# Patient Record
Sex: Female | Born: 1940 | Race: White | Hispanic: No | Marital: Married | Smoking: Former smoker
Health system: Southern US, Community
[De-identification: ages and names within clinical notes are randomized; demographics above are authoritative.]

## PROBLEM LIST (undated history)

## (undated) DIAGNOSIS — I10 Essential (primary) hypertension: Secondary | ICD-10-CM

## (undated) DIAGNOSIS — K649 Unspecified hemorrhoids: Secondary | ICD-10-CM

## (undated) DIAGNOSIS — M1990 Unspecified osteoarthritis, unspecified site: Secondary | ICD-10-CM

## (undated) DIAGNOSIS — M7120 Synovial cyst of popliteal space [Baker], unspecified knee: Secondary | ICD-10-CM

## (undated) DIAGNOSIS — K5900 Constipation, unspecified: Secondary | ICD-10-CM

## (undated) DIAGNOSIS — D499 Neoplasm of unspecified behavior of unspecified site: Secondary | ICD-10-CM

## (undated) DIAGNOSIS — I341 Nonrheumatic mitral (valve) prolapse: Secondary | ICD-10-CM

## (undated) DIAGNOSIS — L719 Rosacea, unspecified: Secondary | ICD-10-CM

## (undated) HISTORY — DX: Unspecified hemorrhoids: K64.9

## (undated) HISTORY — DX: Nonrheumatic mitral (valve) prolapse: I34.1

## (undated) HISTORY — PX: CARDIAC CATHETERIZATION: SHX172

## (undated) HISTORY — PX: COLONOSCOPY: SHX174

## (undated) HISTORY — PX: OTHER SURGICAL HISTORY: SHX169

## (undated) HISTORY — DX: Essential (primary) hypertension: I10

## (undated) HISTORY — DX: Constipation, unspecified: K59.00

---

## 1988-10-24 HISTORY — PX: AUGMENTATION MAMMAPLASTY: SUR837

## 2000-07-03 ENCOUNTER — Other Ambulatory Visit: Admission: RE | Admit: 2000-07-03 | Discharge: 2000-07-03 | Payer: Self-pay | Admitting: Obstetrics and Gynecology

## 2000-07-19 ENCOUNTER — Encounter: Payer: Self-pay | Admitting: Obstetrics and Gynecology

## 2000-07-19 ENCOUNTER — Encounter: Admission: RE | Admit: 2000-07-19 | Discharge: 2000-07-19 | Payer: Self-pay | Admitting: Obstetrics and Gynecology

## 2001-04-16 ENCOUNTER — Inpatient Hospital Stay (HOSPITAL_COMMUNITY): Admission: EM | Admit: 2001-04-16 | Discharge: 2001-04-17 | Payer: Self-pay | Admitting: Emergency Medicine

## 2001-04-16 ENCOUNTER — Encounter: Payer: Self-pay | Admitting: Emergency Medicine

## 2001-05-29 ENCOUNTER — Encounter: Admission: RE | Admit: 2001-05-29 | Discharge: 2001-05-29 | Payer: Self-pay | Admitting: Internal Medicine

## 2001-05-29 ENCOUNTER — Encounter: Payer: Self-pay | Admitting: Internal Medicine

## 2001-07-04 ENCOUNTER — Other Ambulatory Visit: Admission: RE | Admit: 2001-07-04 | Discharge: 2001-07-04 | Payer: Self-pay | Admitting: Obstetrics and Gynecology

## 2001-07-19 ENCOUNTER — Encounter: Admission: RE | Admit: 2001-07-19 | Discharge: 2001-07-19 | Payer: Self-pay | Admitting: Internal Medicine

## 2001-07-19 ENCOUNTER — Encounter: Payer: Self-pay | Admitting: Obstetrics and Gynecology

## 2002-07-29 ENCOUNTER — Encounter: Payer: Self-pay | Admitting: Obstetrics and Gynecology

## 2002-07-29 ENCOUNTER — Encounter: Admission: RE | Admit: 2002-07-29 | Discharge: 2002-07-29 | Payer: Self-pay | Admitting: Internal Medicine

## 2003-08-19 ENCOUNTER — Encounter: Admission: RE | Admit: 2003-08-19 | Discharge: 2003-08-19 | Payer: Self-pay | Admitting: Obstetrics and Gynecology

## 2005-01-14 ENCOUNTER — Encounter: Admission: RE | Admit: 2005-01-14 | Discharge: 2005-01-14 | Payer: Self-pay | Admitting: Obstetrics and Gynecology

## 2005-08-25 ENCOUNTER — Ambulatory Visit: Payer: Self-pay | Admitting: Internal Medicine

## 2005-09-08 ENCOUNTER — Ambulatory Visit: Payer: Self-pay | Admitting: Internal Medicine

## 2006-07-18 ENCOUNTER — Encounter: Admission: RE | Admit: 2006-07-18 | Discharge: 2006-07-18 | Payer: Self-pay | Admitting: Obstetrics and Gynecology

## 2008-01-07 ENCOUNTER — Encounter: Admission: RE | Admit: 2008-01-07 | Discharge: 2008-01-07 | Payer: Self-pay | Admitting: Obstetrics and Gynecology

## 2009-06-30 ENCOUNTER — Encounter: Admission: RE | Admit: 2009-06-30 | Discharge: 2009-06-30 | Payer: Self-pay | Admitting: Obstetrics and Gynecology

## 2011-03-11 NOTE — Cardiovascular Report (Signed)
[place]. [facility]  Patient:    Abigail [name], Abigail [name]                         MRN: [id number] Proc. Date: 04/16/01 Adm. Date:  [id number] Attending:  Ruta Hinds CC:         Janae Bridgeman. Eloise Harman., M.D.  Runell Gess, M.D.   Cardiac Catheterization  PROCEDURE:  Retrograde central aortic catheterization, selective coronary angiography via Judkins technique, left ventriculography, RAO and LAO projection.  Abdominal aortic angiogram, midstream PA projection, Perclose single stitch RFA closure device successful.  The patient was brought to the second floor CP lab in a post absorptive state after 5 mg Valium p.o. premedication.  She was given 2 mg of Versed for sedation in the laboratory.  Xylocaine 1% was used for local anesthesia.  The RFA after appropriate prepping and draping of the right groin was entered with an 18 thin wall needle with a single anterior puncture using modified Seldinger technique.  A 6 French vague sheath was inserted without difficulty and selective coronary angiography was done with a 6 French 4 cm taper Cordis preform coronary and pigtail catheters for LV angiogram in the RAO and LAO projection, 25 cc/14 cc per second, 20 cc/12 cc per second.  Pull back pressure CA was performed and showed no gradient across the aortic valve. Abdominal aortic angiogram in a midstream PA projection was done at 20 cc, 20 cc per second above the level of the renal arteries.  This demonstrated widely patent SMA and celiac.  A single normal left renal artery.  Minor 20-30% narrowing of the proximal right renal artery with good flow and good residual lumen.  The infrarenal abdominal aorta was smooth with no significant atherosclerosis and there was good runoff to the iliacs bilaterally with normal common iliac and intact hypogastrics bilaterally with good runoff.  Following review of the cineangiograms and removal of the catheters, the  hand injection of the right femoral artery showed good position in the RCFA of the sheath.  A single stitch Perclose 6 French device was used for arterial closure successfully.  The patient was transferred to the holding area for postoperative care in stable condition.  RESULTS: 1. Pressures.  LV 106/0.  LVEDP 14 mmHg.  CA 106/53 mmHg.  There    was no gradient across the aortic valve on catheter pullback.    Fluoroscopy did not reveal any intracardiac or valvular    calcification.  There was minor +1 calcification of the LAD. 2. Left ventricular angiogram in the RAO and LAO projection    showed a vigorous ventricle with greater than 60% EF.  MVP was    seen with no mitral regurgitation with sinus beats.  There    were no segmental wall motion abnormalities. 3. The main left coronary is normal. 4. The left anterior descending artery had smooth 40-50% mildly    eccentric narrowing in the mildly segmental and proximal third    after the first septal perforator and before the large first    diagonal.  There was no thrombus or spasm seen and there was    good residual lumen.  The remainder of the LAD was smooth,    widely patent, and coursed to the apex of the heart. 5. The first diagonal branch was moderately large originating    from the proximal third of the LAD, bifurcated and was normal. 6. There was a moderate  size optional diagonal branch that    bifurcated and was normal. 7. The circumflex artery was normal and comprised of a large OM1    branch, a small OM2 branch, and large distal branches with    PVAs in this codominant circumflex system.  The circumflex was    entirely normal. 8. The right coronary artery showed very mild 30% eccentric    narrowing, fairly focal in the midportion before two RV    branches.  The distal small PLA and PDA branches were normal    and there was no other narrowing or regularity of the RCA.  DISCUSSION:  This 70 year old white remarried mother of  three with three stepchildren and four grandchildren is a nonsmoker.  She has possible mild hypercholesterolemia (results unknown).  She recently had symptoms of UTI, and was treated by her gynecologist with Septra DS approximately three days ago. Over the last two days (Dr. Ambrose Mantle).  Over the last two days she has had several episodes of bilateral jaw discomfort with radiation down to the substernal region and substernal heaviness and pressure.  She had two episodes yesterday that lasted approximately five and another 10-15 minutes, and resolved spontaneously.  She had another episode today prompting her to come the emergency room.  She was pain free in the emergency room and had no significant EKG changes, except for nonspecific T-wave change in V1. There was a prior diagnosis of mitral valve prolapse with a midsystolic click, no significant murmur, and this has been asymptomatic.  There is a history of mild hypertension on atenolol.  The patient had an admitting troponin of 0.6, which was elevated with normal CK.  In view of the patients history and elevated troponin it was felt best to perform diagnostic angiography.  Fortunately, this shows no significant coronary artery, although she does have minor atherosclerosis with about 40% segmental smooth proximal LAD disease and 30% eccentric mid right disease. There was some minor calcification of the LAD in this area as well with normal LV function.  I would recommend treatment of this patient medically for mild CAD.  Empiric GI treatment may be in order at this time.  If there is recurrent chest pain, endoscopy may be considered.  In the future, follow-up exercise testing with probable Cardiolite may also be helpful.  Lipids are pending.  CATHETERIZATION DIAGNOSIS: 1. Chest pain.  Etiology not determined. 2. Minor coronary artery disease. a. 40% smooth segmental proximal LAD disease with mild    calcification. b. 30% eccentric mid  RCA disease. c. Normal LV function.  d. Mitral valve prolapse angiographically with no MR. 3. False positive elevated troponin. 4. Possible GERD. 5. Possible hyperlipidemia. 6. Mild systemic hypertension. 7. History of mitral valve prolapse. 8. Recent UTI on Septra therapy. DD:  04/16/01 TD:  04/17/01 Job: [id number]

## 2011-03-11 NOTE — H&P (Signed)
[place]  Patient:    Abigail [name], Abigail [name]                         MRN: [id number] Adm. Date:  [id number] Attending:  Ruta Hinds CC:         Richard A. Alanda Amass, M.D.   History and Physical  CHIEF COMPLAINT: "Ive been having chest pain for the last two days."  HISTORY OF PRESENT ILLNESS: This 70 year old married white female presents to the emergency room here at [facility]. [facility] after a two day history of substernal pressure and tightness associated with some diaphoresis. She also had some pain in the neck on both sides but no radiation in the arm. The first episode occurred yesterday morning early.  She took some aspirin and felt a little better, then had another episode at noon.  She contacted our on-call physician, who reviewed the history and since she was having no pain at that time he recommended that she go to the emergency room if she had any further pain.  This morning she had another milder episode of similar substernal pain and presented to the emergency room for further evaluation. She was seen by the EDP staff and this examiner was called for further disposition and then I contacted [facility] physicians to assess her for emergent catheterization.  PAST MEDICAL HISTORY:  1. Childhood illnesses - usual accidents, stress fracture years ago.  2. Saline breast implants in 1991 by Dr. Pleas Patricia.  3. Endometrial biopsy in July 1997 per Dr. Tracey Harries, gynecologist.  ALLERGIES/SENSITIVITIES/INTOLERANCES: None.  IMMUNIZATIONS: Tetanus booster current as of 1995.  She has not received pneumococcal vaccine yet.  CURRENT MEDICATIONS:  1. Prempro q.d.  2. Atenolol 25 mg q.d. for labile hypertension and control of mitral valve     prolapse syndrome.  3. Multivitamin q.d.  4. Calcium 1200 mg q.d.  5. Vitamin E q.d.  6. Vitamin C q.d.  7. Folic acid q.d.  FAMILY HISTORY: Father dead  at age 77 of tracheal cancer.  He was a smoker and was a Education officer, community by trade.  Mother died at age 13 of pulmonary embolus after hip revision surgery.  She walked around on a fractured hip for about two weeks and then had surgery and had the embolus and died.  She had a history of hypertension.  Siblings - one brother in good health.  Her second husband is in good health and is one year younger than the patient.  She has three children by her first husband, they are in good health.  A maternal aunt had heart attacks by history, the first when she was about mid-50; she died at age 21.  SOCIAL HISTORY: Marital status, married.  Religion, not specified.  Alcohol, frequent occasional beverage.  Caffeine, one cup a day.  Recreational drugs, none.  Sleep pattern/sleeping pills, none/normal/none.  Laxatives, occasionally used.  Occupation, housewife.  Club or church activity, some participation.  Tobacco, none at this time.  She did smoke less than a pack a day "socially" from the time she was age 60 up until the time she was about 70 years old.  She has had none in the last 15 years.  Exercise preference, she likes to work out regularly at a [employer].  She does gardening and walking.  Sexual function, denies problems.  Seat beats used 100% of the time.  REVIEW OF SYSTEMS: GENERAL:  Generally she has felt well.  No recent weight changes.  No fever, chills, or sweats.  No unusual weakness.  Her weight has not changed much.  SKIN: No significant changes in her skin or nails. HEAD/EYES: Denies any visual or hearing problems.  She does wear reading glasses.  No history of cataracts or glaucoma.  ENT: Denies any hearing or sinus problems.  She does have a little bit of runny nose intermittently.  She sees a Education officer, community on a regular basis.  RESPIRATORY: No recurring cough, wheezing, asthma, chest pain, or significant shortness of breath.  No known TB exposures.  CARDIOVASCULAR: History of mitral  valve prolapse and was documented on 2D echocardiogram in our office in 1998.  The valve was only mildly prolapsing.  She denies any major problems with her blood pressure and her pressures at home generally are much lower than any of the office readings, which have ranged as high as 168/112 in 1998, more recently 160/90, and down to 140/84.  GASTROINTESTINAL: Normal appetite and thirst.  She denies any significant reflux symptoms.  No nausea, vomiting, diarrhea, constipation of any consequence.  No history of jaundice.  GENITOURINARY: She is on treatment for acute cystitis as of about four days ago and she has taken Septra DS b.i.d. prescribed by her gynecologist.  She normally has no recurring infections and has had no kidney problems.  SEXUAL/REPRODUCTIVE: No menopausal problems.  She is on Prempro.  Last Pap smear per her gynecologist on an annual basis.  She has had an endometrial biopsy once by Dr. Ambrose Mantle with benign tissue found.  MUSCULOSKELETAL: She has a little bit of aching in her thighs, especially on the left sometimes after a workout but that rapidly resolves, and sometimes it occurs without a workout.  She has no joint complaints.  No swelling, no history of gout.  NEUROLOGIC: No changes in consciousness, no history of seizures, syncope, numbness, tingling, tremors, or memory loss.  ENDOCRINOLOGIC: No thyroid disorders, no heat or cold intolerance, no excessive hair growth.  HEMATOLOGIC: No bleeding or bruising. No history of transfusions.  PSYCHOLOGIC: Seems well adjusted and does not have any complaints of depression, nightmares, insomnia, or memory loss.  PHYSICAL EXAMINATION:  VITAL SIGNS: On presentation to the emergency room at 0735 temperature was 97.5 degrees, automatic blood pressure recorded at 152/70, pulse 58, respirations 18.  GENERAL: Currently she is lying in bed, comfortable, without any chest complaints, etc.  HEENT: PERRLA directly and consensually.   EOMI.  No signs of scleral icterus. Oral cavity shows good hygiene.  No lesions about the mouth.  Ear canals are  patent.  Skull shows no signs of trauma.  NECK: Supple.  No thyromegaly or adenopathy.  There are no carotid bruits.  HEART: Regular rate and rhythm without murmurs, gallops, rubs, or clicks.  CHEST: Lungs are completely clear.  ABDOMEN: Soft, nontender.  No masses.  PELVIC/RECTAL: Deferred.  EXTREMITIES: No edema, cyanosis, or clubbing.  She has excellent range of motion of her hips, knees, ankles.  Pulses are intact in the feet.  No lesions about the feet.  SKIN: Clear, without any unusual lesions noted grossly.  NEUROLOGIC: Grossly intact with no focal deficits evident on the bedside examination.  LABORATORY DATA: Her i-STAT in the emergency room initially shows a glucose of 101, BUN 10, sodium 137, chloride 105.  Hematocrit 40%, hemoglobin 14 g.  Her CK total was normal at 64.  Her troponin I was 0.60.  CBC shows a WBC of  5400, hemoglobin 14.1 g, hematocrit 40%; MCV 91.4; platelet count 287,000.  Chest x-ray done in the emergency room, but report is not yet available.  EKG was reportedly normal and was compared to office EKG of September 1997. The actual cardiogram is not in the chart at this time.  IMPRESSION:  1. Acute substernal chest pain strongly suspicious of coronary source, with     possible acute myocardial infarction evident from elevated troponin with     normal CK.  2. History of mitral valve prolapse.  3. Hypertension.  4. Borderline hyperlipidemia by history.  PLAN: The patient is admitted to the service of Dr. Dimas Alexandria. Consultants are [facility], Dr. Alanda Amass, who has seen the patient and agrees to indications for cardiac catheterization as soon as this can be accomplished today.  If nothing of significance is found she may be a candidate for early discharge. DD:  04/16/01 TD:  04/17/01 Job:  [id number]

## 2011-03-11 NOTE — Discharge Summary (Signed)
[place]  Patient:    SARYAH, LOPER                         MRN: [id number] Adm. Date:  [id number] Disc. Date: 04/17/01 Attending:  Ruta Hinds CC:         Richard A. Alanda Amass, M.D.   Discharge Summary  FINAL DIAGNOSES: 1. Substernal chest pain, probably noncoronary origin. 2. Urinary tract infection under treatment with Septra. 3. Possible esophageal spasms with intolerance to sulfa resulting in the    chest pain syndrome. 4. Mild hyperlipidemia with LDL cholesterol of 116 and total cholesterol of    219. 5. Urinary tract infection discovered and treatment initiated prior to    hospitalization.  HISTORY OF PRESENT ILLNESS:  This 70 year old married white female presented with a two-day history of substernal chest pain radiating to both sides of her neck and associated with some diaphoresis.  No nausea and vomiting.  Right after the second episode, she did present to the emergency room as directed by the [facility].  She was evaluated by the emergency room staff and subsequently admitted for further evaluation of her heart when her troponin I returned at 0.6 nannograms per ml with a normal CK of 64.  HOSPITAL COURSE:  The patient was admitted to the hospital.  She had consultation by Richard A. Alanda Amass, M.D. of [facility] in the absence of [facility] cardiologist, Aram Candela. Aleen Campi, M.D.  The consultant agreed that cardiac catheterization was indicated and this was accomplished on the afternoon of April 16, 2001. Findinsgs included the following:  40 to 50% smooth narrowing of the left main coronary artery.  There were some other relatively minor blockages maximum of 20 to 30%.  There was some mitral valve prolapse, but no mitral valve regurgitation.  Her ejection fraction was estimated at greater than 60%.  The consultant recommended continuation of  beta blocker therapy, lipid therapy, and further medical treatment.  On the morning of discharge, the patient was standing at the sink and had another episode of chest pain, although, she had no other untoward effects.  She had just taken her Septra DS antibiotic and it is considered to be a possible irritant and cause of her esophageal spasm. Her cardiac monitor showed no changes whatsoever during this episode. Nonetheless it was felt that the patient was stable enough for discharge. Thorough instructions were provided to her and her husband as well as written instructions.  LABORATORY DATA:  Labs as described above.  CBC was normal with hemoglobin of 14.1, hematocrit 40%.  Other labs were not performed.  A urinalysis ordered yesterday is not back in the chart yet.  CONSULTING PHYSICIANS:  Richard A. Alanda Amass, M.D., cardiologist for [facility].  PROCEDURE:  Cardiac catheterization by Dr. Alanda Amass.  RECOMMENDATIONS:  The patient was advised to change her beta blocker to Toprol XL 50 mg daily.  We will place her on Nexium 40 mg daily for possible acid reflux over the next 30 days.  She will start Lipitor 1/2 of a 10 mg tablet after the evening meal after fasting lipid panel is drawn on Thursday morning, two days hence.  She will continue on her prudent low fat diet and avoid carbonation and spicy food.  She will watch her groin for any signs of bleeding.  She will also return in four weeks for fasting lipids and  SGPT and see me in five weeks.  She will call for any further chest pain.  She will of course discontinue the sulfa at this time.  A follow-up urinalysis will also be done in two days.  CONDITION ON DISCHARGE:  Stable. DD:  04/17/01 TD:  04/17/01 Job: [id number]

## 2012-03-15 ENCOUNTER — Other Ambulatory Visit: Payer: Self-pay | Admitting: Obstetrics and Gynecology

## 2012-03-15 DIAGNOSIS — Z1231 Encounter for screening mammogram for malignant neoplasm of breast: Secondary | ICD-10-CM

## 2012-04-10 ENCOUNTER — Ambulatory Visit
Admission: RE | Admit: 2012-04-10 | Discharge: 2012-04-10 | Disposition: A | Discharge status: Home / Self Care | Payer: Medicare Other | Source: Ambulatory Visit | Attending: Obstetrics and Gynecology | Admitting: Obstetrics and Gynecology

## 2012-04-10 DIAGNOSIS — Z1231 Encounter for screening mammogram for malignant neoplasm of breast: Secondary | ICD-10-CM

## 2014-05-20 ENCOUNTER — Ambulatory Visit (INDEPENDENT_AMBULATORY_CARE_PROVIDER_SITE_OTHER): Payer: Medicare Other | Admitting: Family Medicine

## 2014-05-20 ENCOUNTER — Ambulatory Visit (INDEPENDENT_AMBULATORY_CARE_PROVIDER_SITE_OTHER)
Admission: RE | Admit: 2014-05-20 | Discharge: 2014-05-20 | Disposition: A | Discharge status: Home / Self Care | Payer: Medicare Other | Source: Ambulatory Visit | Attending: Family Medicine | Admitting: Family Medicine

## 2014-05-20 ENCOUNTER — Other Ambulatory Visit (INDEPENDENT_AMBULATORY_CARE_PROVIDER_SITE_OTHER): Payer: Medicare Other

## 2014-05-20 VITALS — BP 140/80 | HR 53 | Ht 66.5 in | Wt 127.0 lb

## 2014-05-20 DIAGNOSIS — M25562 Pain in left knee: Secondary | ICD-10-CM

## 2014-05-20 DIAGNOSIS — M25569 Pain in unspecified knee: Secondary | ICD-10-CM

## 2014-05-20 DIAGNOSIS — M7120 Synovial cyst of popliteal space [Baker], unspecified knee: Secondary | ICD-10-CM | POA: Insufficient documentation

## 2014-05-20 DIAGNOSIS — M7122 Synovial cyst of popliteal space [Baker], left knee: Secondary | ICD-10-CM

## 2014-05-20 NOTE — Assessment & Plan Note (Signed)
Patient significant amount fluid removed from the Baker cyst. Patient was given a compression sleeve and told to wear for the next 48 hours. We discussed icing protocol and which exercises would be beneficial. We discussed over-the-counter medications he can help slow the progression of any arthritis patient may be having. Patient will come back again in 2-3 weeks for further evaluation and treatment.

## 2014-05-20 NOTE — Progress Notes (Signed)
[name] [address], [place] [address] Phone: [phone] Subjective:     CC: Left knee pain  Abigail [name] is a 73 y.o. female coming in with complaint of left knee pain. Patient states that she has been diagnosed previously with a Baker cyst. Patient states that the pain is getting more significant the posterior aspect of his knee. Patient has had aspiration of this 2 times over the course last 2 years. Last time was greater than 6 months ago. Patient has not tried any O. modalities such as bracing, icing, or any over-the-counter anti-inflammatories. Patient states that due to the discomfort she has started avoiding such things as taking stairs a regular basis. Denies any radiation of the leg or any numbness or tingling. X-rays were ordered reviewed interpreted by me today. X-ray show that patient has some mild degenerative changes about the medial and lateral joint lines.    Past medical history, social, surgical and family history all reviewed in electronic medical record.   Review of Systems: No headache, visual changes, nausea, vomiting, diarrhea, constipation, dizziness, abdominal pain, skin rash, fevers, chills, night sweats, weight loss, swollen lymph nodes, body aches, joint swelling, muscle aches, chest pain, shortness of breath, mood changes.   Objective Blood pressure 140/80, pulse 53, height 5' 6.5" (1.689 m), weight 127 lb (57.607 kg), SpO2 99.00%.  General: No apparent distress alert and oriented x3 mood and affect normal, dressed appropriately.  HEENT: Pupils equal, extraocular movements intact  Respiratory: Patient's speak in full sentences and does not appear short of breath  Cardiovascular: No lower extremity edema, non tender, no erythema  Skin: Warm dry intact with no signs of infection or rash on extremities or on axial skeleton.  Abdomen: Soft nontender  Neuro: Cranial nerves II through XII are intact,  neurovascularly intact in all extremities with 2+ DTRs and 2+ pulses.  Lymph: No lymphadenopathy of posterior or anterior cervical chain or axillae bilaterally.  Gait normal with good balance and coordination.  MSK:  Non tender with full range of motion and good stability and symmetric strength and tone of shoulders, elbows, wrist, hip,  and ankles bilaterally.  Knee: Left Normal to inspection with no erythema or effusion or obvious bony abnormalities. Patient actually does have some swelling of the posterior popliteal fossa Mild tenderness to palpation of the popliteal fossa and the medial joint line.. Range of motion lacks the last 5 of flexion. Ligaments with solid consistent endpoints including ACL, PCL, LCL, MCL. Negative Mcmurray's, Apley's, and Thessalonian tests. Non painful patellar compression. Patellar glide without crepitus. Patellar and quadriceps tendons unremarkable. Hamstring and quadriceps strength is normal.   MSK US performed of: Left knee This study was ordered, performed, and interpreted by Abigail [name] D.O.  Knee: All structures visualized. Anteromedial, anterolateral, posteromedial, and posterolateral menisci unremarkable without tearing, fraying, effusion, or displacement. Mild narrowing of the medial joint line Patellar Tendon unremarkable on long and transverse views without effusion. No abnormality of prepatellar bursa. LCL and MCL unremarkable on long and transverse views. No abnormality of origin of medial or lateral head of the gastrocnemius. Large Baker cyst noted.  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  After verbal consent patient was prepped with alcohol swabs. Patient and her 23-gauge 2 inch needle was inserted in the posterior fossa were patient was prone directing from inferior medial ankle. Patient then had a injection of 3 cc of 0.5% Marcaine. Patient  then had aspiration of 30 cc of straw-colored fluid.  No bleeding noted. Patient had 1 cc of Kenalog 40 mg/dL injected into the cyst. Compression dressing was placed. Post aspiration instructions given.    Impression and Recommendations:     This case required medical decision making of moderate complexity.

## 2014-05-20 NOTE — Patient Instructions (Signed)
Good to meet you.  Ice 20 minutes 2 times daily. Usually after activity and before bed. Wear the compression next 48 hours then with activity.  Xrays downstairs today.  Take tylenol 650 mg three times a day is the best evidence based medicine we have for arthritis.  Glucosamine sulfate 750mg  twice a day is a supplement that has been shown to help moderate to severe arthritis. Vitamin D 2000 IU daily Fish oil 2 grams daily.  Tumeric 500mg  twice daily.  Capsaicin topically up to four times a day may also help with pain. Cortisone injections are an option if these interventions do not seem to make a difference or need more relief.  If cortisone injections do not help, there are different types of shots that may help but they take longer to take effect.  We can discuss this at follow up.  It's important that you continue to stay active.  El Consider physical therapy to strengthen muscles around the joint that hurts to take pressure off of the joint itself. Shoe inserts with good arch support may be helpful.  [employer] orthotics at [employer] could help.  Water aerobics and cycling AND ELLIPTICAL with low resistance are the best two types of exercise for arthritis. Come back and see me in 3 weeks.

## 2014-06-13 ENCOUNTER — Ambulatory Visit (INDEPENDENT_AMBULATORY_CARE_PROVIDER_SITE_OTHER): Payer: Medicare Other | Admitting: Family Medicine

## 2014-06-13 ENCOUNTER — Encounter: Payer: Self-pay | Admitting: Family Medicine

## 2014-06-13 VITALS — BP 108/72 | HR 56 | Ht 66.5 in | Wt 125.0 lb

## 2014-06-13 DIAGNOSIS — M1710 Unilateral primary osteoarthritis, unspecified knee: Secondary | ICD-10-CM

## 2014-06-13 DIAGNOSIS — M7122 Synovial cyst of popliteal space [Baker], left knee: Secondary | ICD-10-CM

## 2014-06-13 DIAGNOSIS — M7120 Synovial cyst of popliteal space [Baker], unspecified knee: Secondary | ICD-10-CM

## 2014-06-13 NOTE — Patient Instructions (Signed)
It is good to see you. Continue the exercises Try the new brace with a lot of walking or at the gym Physical therapy will be calling you Add turmeric 500mg  daily.  Conitnue the compression on the other knee.  For your back stand on a wall, heels, butt, shoulder and head touching for goal of 5 minutes daily.  Come back in 6 weeks.

## 2014-06-13 NOTE — Assessment & Plan Note (Signed)
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 Patient was given a brace was fitted by me today on the right knee. We discussed home exercises and we discussed protein supplementation to be beneficial. Patient can try these interventions as well as formal physical therapy and come back in 4-6 weeks for further evaluation and treatment.  Spent greater than 25 minutes with patient face-to-face and had greater than 50% of counseling including as described above in assessment and plan.

## 2014-06-13 NOTE — Progress Notes (Signed)
  [name] [place],  [address] Phone: [phone] Subjective:     CC: Left knee pain  DPO:EUMPNTIRWE ROSHELL BRIGHAM is a 73 y.o. female coming in with complaint of left knee pain. Patient states that she has been diagnosed previously with a Baker cyst. Patient had this drained 3 weeks ago and had a corticosteroid injection in the area. Patient states she is not having a significant pain. Patient is very happy with the result. Patient states that she has not started doing any exercises at this time. States that she'll he has some mild soreness on the medial aspect of her knees. Not giving her any internal derangement signs or any pain at night.    Past medical history, social, surgical and family history all reviewed in electronic medical record.   Review of Systems: No headache, visual changes, nausea, vomiting, diarrhea, constipation, dizziness, abdominal pain, skin rash, fevers, chills, night sweats, weight loss, swollen lymph nodes, body aches, joint swelling, muscle aches, chest pain, shortness of breath, mood changes.   Objective Blood pressure 108/72, pulse 56, height 5' 6.5" (1.689 m), weight 125 lb (56.7 kg), SpO2 99.00%.  General: No apparent distress alert and oriented x3 mood and affect normal, dressed appropriately.  HEENT: Pupils equal, extraocular movements intact  Respiratory: Patient's speak in full sentences and does not appear short of breath  Cardiovascular: No lower extremity edema, non tender, no erythema  Skin: Warm dry intact with no signs of infection or rash on extremities or on axial skeleton.  Abdomen: Soft nontender  Neuro: Cranial nerves II through XII are intact, neurovascularly intact in all extremities with 2+ DTRs and 2+ pulses.  Lymph: No lymphadenopathy of posterior or anterior cervical chain or axillae bilaterally.  Gait normal with good balance and coordination.  MSK:  Non tender with full range of  motion and good stability and symmetric strength and tone of shoulders, elbows, wrist, hip,  and ankles bilaterally.  Knee: Left Normal to inspection with no erythema or effusion or obvious bony abnormalities. Patient actually does have some swelling of the posterior popliteal fossa Mild tenderness to palpation of the popliteal fossa and the medial joint line.. Range of motion lacks the last 5 of flexion. Ligaments with solid consistent endpoints including ACL, PCL, LCL, MCL. Negative Mcmurray's, Apley's, and Thessalonian tests. Non painful patellar compression. Patellar glide without crepitus. Patellar and quadriceps tendons unremarkable. Hamstring and quadriceps strength is normal.   MSK US performed of: Left knee This study was ordered, performed, and interpreted by Charlann Boxer D.O.  Knee: All structures visualized. Anteromedial, anterolateral, posteromedial, and posterolateral menisci unremarkable without tearing, fraying, effusion, or displacement. Mild narrowing of the medial joint line Patellar Tendon unremarkable on long and transverse views without effusion. No abnormality of prepatellar bursa. LCL and MCL unremarkable on long and transverse views. No abnormality of origin of medial or lateral head of the gastrocnemius. Baker's cyst still noted but significantly smaller than previous exam.  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     Impression and Recommendations:     This case required medical decision making of moderate complexity.

## 2014-06-13 NOTE — Assessment & Plan Note (Signed)
Patient Abigail [name] is coming back slowly. We'll continue to monitor closely. Do not feel that we need to do anything else. We'll continue to monitor and continued compression sleeves. Patient does have mild underlying osteoarthritis that could be contributing.

## 2014-07-18 ENCOUNTER — Ambulatory Visit (INDEPENDENT_AMBULATORY_CARE_PROVIDER_SITE_OTHER): Payer: Medicare Other | Admitting: *Deleted

## 2014-07-18 DIAGNOSIS — Z23 Encounter for immunization: Secondary | ICD-10-CM

## 2014-07-25 ENCOUNTER — Ambulatory Visit: Payer: Medicare Other | Admitting: Family Medicine

## 2014-08-01 ENCOUNTER — Ambulatory Visit: Payer: Medicare Other | Admitting: Family Medicine

## 2014-08-06 ENCOUNTER — Ambulatory Visit: Payer: Medicare Other | Admitting: Family Medicine

## 2014-08-15 ENCOUNTER — Ambulatory Visit (INDEPENDENT_AMBULATORY_CARE_PROVIDER_SITE_OTHER): Payer: Medicare Other | Admitting: Family Medicine

## 2014-08-15 ENCOUNTER — Encounter: Payer: Self-pay | Admitting: Family Medicine

## 2014-08-15 VITALS — BP 124/82 | HR 55 | Ht 66.0 in | Wt 129.0 lb

## 2014-08-15 DIAGNOSIS — M1710 Unilateral primary osteoarthritis, unspecified knee: Secondary | ICD-10-CM

## 2014-08-15 DIAGNOSIS — M7122 Synovial cyst of popliteal space [Baker], left knee: Secondary | ICD-10-CM

## 2014-08-15 NOTE — Assessment & Plan Note (Signed)
Patient did have aspiration done today as well. 25 cc of fluid removed.

## 2014-08-15 NOTE — Assessment & Plan Note (Addendum)
Patient does have bilateral mild to moderate osteoarthritic changes. Patient was given corticosteroid injections bilaterally today. We discussed icing protocol as well as home exercises. Discussed over-the-counter bracing that might be beneficial. Encourage patient to continue to stay active. Patient will do and icing protocol. Come back in 3 weeks.  PT reorded. Patient does not make any significant improvement at that the corticosteroid injections as well as formal physical therapy patient would be a candidate for viscous supplementation.  Spent greater than 25 minutes with patient face-to-face and had greater than 50% of counseling including as described above in assessment and plan.

## 2014-08-15 NOTE — Patient Instructions (Signed)
Good to see you as always.  Continue the compression [email or web address]  Size medium knee sleeve.  Ice is your friend Arloa Koh try physical therapy again.  Try pennsaid twice daily to hip or knees as needed.  See you again  In 3 weeks and if still in paincan consider orthovisc injections.

## 2014-08-15 NOTE — Progress Notes (Signed)
[name] [address] [place], [place] [address] Phone: [phone] Subjective:     CC: bilateral knee pain   LGX:QJJHERDEYC Abigail [name] is a 73 y.o. female coming in with complaint of left knee pain. 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 Patient had this drained back in July. Patient was also found to have mild osteoarthritic changes. Patient was given a brace and was sent to formal physical therapy. Patient forcefully never got him to formal physical therapy. Patient has been doing some in the home exercises as well as when compression but not wearing the brace a regular basis. Patient states that her knee pain is about the same. Patient states that she's doing the over-the-counter medicines intermittently and doing the exercises intermittently. Patient states that she walks long distances it is more painful. Patient denies any nighttime awakening but finds it difficult to become comfortable at night. Denies any new symptoms has worsening of previous symptoms. Patient also notices that unfortunately her Baker cyst on the left side has enlarged.  Past medical history, social, surgical and family history all reviewed in electronic medical record.   Review of Systems: No headache, visual changes, nausea, vomiting, diarrhea, constipation, dizziness, abdominal pain, skin rash, fevers, chills, night sweats, weight loss, swollen lymph nodes, body aches, joint swelling, muscle aches, chest pain, shortness of breath, mood changes.   Objective Blood pressure 124/82, pulse 55, height 5\' 6"  (1.676 m), weight 129 lb (58.514 kg), SpO2 99.00%.  General: No apparent distress alert and oriented x3 mood and affect normal, dressed appropriately.  HEENT: Pupils equal, extraocular movements intact  Respiratory: Patient's speak in full sentences and does not appear  short of breath  Cardiovascular: No lower extremity edema, non tender, no erythema  Skin: Warm dry intact with no signs of infection or rash on extremities or on axial skeleton.  Abdomen: Soft nontender  Neuro: Cranial nerves II through XII are intact, neurovascularly intact in all extremities with 2+ DTRs and 2+ pulses.  Lymph: No lymphadenopathy of posterior or anterior cervical chain or axillae bilaterally.  Gait normal with good balance and coordination.  MSK:  Non tender with full range of motion and good stability and symmetric strength and tone of shoulders, elbows, wrist, hip,  and ankles bilaterally.  Knee: Left Normal to inspection with no erythema or effusion or obvious bony abnormalities. Patient actually does have some swelling of the posterior popliteal fossa Mild tenderness to palpation of the popliteal fossa and the medial joint line.. Range of motion lacks the last 5 of flexion. Ligaments with solid consistent endpoints including ACL, PCL, LCL, MCL. Negative Mcmurray's, Apley's, and Thessalonian tests. Non painful patellar compression. Patellar glide without crepitus. Patellar and quadriceps tendons unremarkable. Hamstring and quadriceps strength is normal.  Right knee is tender to palpation over the medial joint line.  After informed written and verbal consent, patient was seated on exam table. Right knee was prepped with alcohol swab and utilizing anterolateral approach, patient's right knee space was injected with 4:1  marcaine 0.5%: Kenalog 40mg /dL. Patient tolerated the procedure well without immediate complications.  After informed written and verbal consent, patient was seated on exam table. Left knee was prepped with alcohol swab and utilizing anterolateral approach, patient's left knee space was injected with 4:1  marcaine 0.5%: Kenalog 40mg /dL. Patient tolerated the procedure well without immediate complications. Patient then  went into a prone position and then with  a 21-gauge 2 inch needle was injected with 2 cc of 0.5% Marcaine after sterilizing the shin over the Baker's cyst. Patient had 25 cc of fluid removed. Patient tolerated the procedure well. No signs of infection. Postinjection instructions given.      Impression and Recommendations:     This case required medical decision making of moderate complexity.

## 2014-09-02 ENCOUNTER — Encounter: Payer: Self-pay | Admitting: Physical Therapy

## 2014-09-02 ENCOUNTER — Ambulatory Visit: Payer: Medicare Other | Attending: Family Medicine | Admitting: Physical Therapy

## 2014-09-02 DIAGNOSIS — M25561 Pain in right knee: Secondary | ICD-10-CM | POA: Diagnosis not present

## 2014-09-02 DIAGNOSIS — M25562 Pain in left knee: Secondary | ICD-10-CM | POA: Diagnosis not present

## 2014-09-02 DIAGNOSIS — M170 Bilateral primary osteoarthritis of knee: Secondary | ICD-10-CM | POA: Diagnosis not present

## 2014-09-02 NOTE — Therapy (Signed)
Physical Therapy Evaluation  Patient Details  Name: Abigail [name] MRN: [id number] Date of Birth: [birth year]  Encounter Date: 09/02/2014      PT End of Session - 09/02/14 1103    Visit Number 1   Number of Visits 16   Date for PT Re-Evaluation 11/01/14   PT Start Time 1017   PT Stop Time 1059   PT Time Calculation (min) 42 min   Activity Tolerance Patient tolerated treatment well   Behavior During Therapy [facility] for tasks assessed/performed      History reviewed. No pertinent past medical history.  History reviewed. No pertinent past surgical history.  There were no vitals taken for this visit.  Visit Diagnosis:  Arthralgia of both knees - Plan: PT plan of care cert/re-cert  Arthritis of both knees - Plan: PT plan of care cert/re-cert      Subjective Assessment - 09/02/14 1019    Symptoms Pt presents to OPPT for bil knee pain and OA, L>R.  Pt. with history of baker's cyst L knee, and stopped exercise at that time about 2 years ago.  Pt. reports knee pain began about 2 years ago however recent increased difficulty with negotiating stairs.  Pt. reports decreased strength and pain.  Pt with recent injection in bil knees and reports good relief with those.   Limitations Standing;Walking;House hold activities   How long can you stand comfortably? varies, typically back pain increases before knee pain with standing   How long can you walk comfortably? 1 mile   Patient Stated Goals go up/down stairs, perform yardwork, walk dog   Currently in Pain? Yes   Pain Score 2    Pain Location Knee   Pain Orientation Right;Left;Anterior   Pain Descriptors / Indicators Aching;Radiating  occasional radiating to ankles   Pain Type Chronic pain   Pain Onset More than a month ago   Pain Frequency Intermittent   Aggravating Factors  prolonged standing/walking, overuse   Pain Relieving Factors injections, sitting/rest   Effect of Pain on Daily Activities go up/down stairs, performing yardwork,  walking dog   Multiple Pain Sites No          [facility] PT Assessment - 09/02/14 0700    Assessment   Medical Diagnosis bil knee OA   Next MD Visit 1 week   Prior Therapy no formal PT, has exercises from MD   Precautions   Precautions None   Restrictions   Weight Bearing Restrictions No   Balance Screen   Has the patient fallen in the past 6 months No   Has the patient had a decrease in activity level because of a fear of falling?  No   Is the patient reluctant to leave their home because of a fear of falling?  No   Home Environment   Living Enviornment Private residence   Living Arrangements Spouse/significant other   Available Help at Discharge Available 24 hours/day   Type of [place] to enter   Entrance Stairs-Number of Steps 1   Entrance Stairs-Rails None   Home Layout Two level   Alternate Level Stairs-Number of Steps 13   Alternate Level Stairs-Rails Right   Prior Function   Level of Independence Independent with homemaking with ambulation;Independent with gait;Independent with transfers   Vocation Retired   Leisure active with yardwork and gardening   Observation/Other Assessments   Focus on Therapeutic Outcomes (FOTO)  42  58% limited, 45% predicted   AROM  Overall AROM  Within functional limits for tasks performed   Overall AROM Comments bil knee ROM WNL   Strength   Right Hip Flexion 3/5   Right Hip Extension 3/5   Right Hip External Rotation  4   Right Hip Internal Rotation  3   Right Hip ABduction 3+/5   Right Hip ADduction 3/5   Left Hip Flexion 3/5   Left Hip Extension 3/5   Left Hip External Rotation  4   Left Hip Internal Rotation  3   Left Hip ABduction 3+/5   Left Hip ADduction 3/5   Right Knee Flexion 4/5   Right Knee Extension 4/5   Left Knee Flexion 4/5   Left Knee Extension 4/5   Right Ankle Dorsiflexion 4/5   Left Ankle Dorsiflexion 4/5   Special Tests    Special Tests --   Knee Special tests  --    Ambulation/Gait   Stairs Yes   Stairs Assistance 7: Independent   Stair Management Technique No rails;Alternating pattern  bil internal rotation suggestive of weak hip stabilizers   Number of Stairs 10   Height of Stairs 4  and 6 in.            PT Education - 09/02/14 1103    Education provided Yes   Education Details HEP   Person(s) Educated Patient   Methods Explanation;Demonstration;Handout   Comprehension Verbalized understanding          PT Short Term Goals - 09/02/14 1106    PT SHORT TERM GOAL #1   Title independent with HEP   Time 4   Period Weeks   Status New   PT SHORT TERM GOAL #2   Title report return to community fitness at least 2 days/week for endurance and strengthening   Time 4   Period Weeks   Status New   PT SHORT TERM GOAL #3   Title report ability to walk 1.5 miles without increase in pain   Time 4   Period Weeks   Status New          PT Long Term Goals - 09/02/14 1107    PT LONG TERM GOAL #1   Title verbalize understanding of RICE to reduce the risk of reinjury   Time 8   Period Weeks   Status New   PT LONG TERM GOAL #2   Title independent with advanced HEP   Time 8   Period Weeks   Status New   PT LONG TERM GOAL #3   Title report ability to walk 2 miles without increase in pain   Time 8   Period Weeks   Status New   PT LONG TERM GOAL #4   Title negotiate stairs/ambulate > 100' carrying at least 10# to simulate home and yard activities without increase in pain or LOB.   Time 8   Period Weeks   Status New          Plan - 09/02/14 1103    Clinical Impression Statement Pt presents to OPPT for deficits listed above.  Will continue to benefit from PT to maximize functional mobility and decrease pain.   Pt will benefit from skilled therapeutic intervention in order to improve on the following deficits Difficulty walking;Pain;Decreased strength;Decreased activity tolerance;Decreased endurance   Rehab Potential Good   PT  Frequency 2x / week   PT Duration 8 weeks   PT Treatment/Interventions ADLs/Self Care Home Management;Moist Heat;Therapeutic activities;Patient/family education;Therapeutic exercise;Gait training;Balance training;Manual techniques;Neuromuscular re-education;Stair training;Electrical  Stimulation;Functional mobility training;Compression bandaging;Ultrasound   PT Next Visit Plan review HEP; progress strengthening as tolerated   PT Home Exercise Plan perform as instructed   Consulted and Agree with Plan of Care Patient          G-Codes - 09-25-2014 1112    Functional Assessment Tool Used FOTO 42 (58% limited)   Functional Limitation Mobility: Walking and moving around   Mobility: Walking and Moving Around Current Status (B1517) At least 40 percent but less than 60 percent impaired, limited or restricted   Mobility: Walking and Moving Around Goal Status [phone]) At least 40 percent but less than 60 percent impaired, limited or restricted      Problem List Patient Active Problem List   Diagnosis Date Noted  . Primary localized osteoarthrosis, lower leg 06/13/2014  . Baker's cyst of knee 05/20/2014                                              Faustino Congress K 2014-09-25, 11:16 AM

## 2014-09-02 NOTE — Patient Instructions (Signed)
Knee High   Holding stable object, raise knee to hip level, then lower knee. Repeat with other knee. Repeat _10-15___ times. Do _2-3___ sessions per day.  http://gt2.exer.us/767   ABDUCTION: Standing (Active)   Stand, feet flat. Lift right leg out to side.  Complete _1__ sets of _10-15__ repetitions. Perform _2-3__ sessions per day.  http://gtsc.exer.us/110   Copyright  VHI. All rights reserved.   FLEXION: Standing - Stable (Active)   Stand, both feet flat. Bend right knee, bringing heel toward buttocks.  Complete _1__ sets of _10-15__ repetitions. Perform _2-3__ sessions per day.  http://gtsc.exer.us/240   Copyright  VHI. All rights reserved.   Mini-Squats (Standing)   Stand with support. Bend knees slightly. Hold for _2-3__ seconds. Return to straight standing. Relax for _2-3__ seconds. Repeat _10-15__ times. Do _2-3__ times a day.  Copyright  VHI. All rights reserved.     Heel Raises   Stand with support. With knees straight, raise heels off ground. Hold _2-3__ seconds. Relax for _2-3__ seconds. Repeat _10-15__ times. Do _2-3__ times a day.  Copyright  VHI. All rights reserved.   EXTENSION: Standing (Active)   Stand, both feet flat. Draw right leg behind body as far as possible. Complete _1__ sets of _10-15__ repetitions. Perform _2-3__ sessions per day.  http://gtsc.exer.us/76   Copyright  VHI. All rights reserved.

## 2014-09-09 ENCOUNTER — Ambulatory Visit: Payer: Medicare Other | Admitting: Family Medicine

## 2014-09-25 ENCOUNTER — Encounter: Payer: Medicare Other | Admitting: Physical Therapy

## 2014-09-30 ENCOUNTER — Telehealth: Payer: Self-pay | Admitting: *Deleted

## 2014-09-30 ENCOUNTER — Ambulatory Visit: Payer: Medicare Other | Attending: Family Medicine | Admitting: Physical Therapy

## 2014-09-30 DIAGNOSIS — M25562 Pain in left knee: Secondary | ICD-10-CM | POA: Diagnosis not present

## 2014-09-30 DIAGNOSIS — M25561 Pain in right knee: Secondary | ICD-10-CM | POA: Insufficient documentation

## 2014-09-30 DIAGNOSIS — M170 Bilateral primary osteoarthritis of knee: Secondary | ICD-10-CM | POA: Diagnosis not present

## 2014-09-30 NOTE — Patient Instructions (Signed)
Piriformis Stretch, Supine   Lie supine, one ankle crossed onto opposite knee. Holding bottom leg behind knee, gently pull legs toward chest until stretch is felt in buttock of top leg. Hold _30__ seconds. For deeper stretch gently push top knee away from body.  Repeat _3__ times per session. Do __1-3_ sessions per day.  Copyright  VHI. All rights reserved.    Iliotibial Band Stretch   Stand with right hip __6-8__ inches from wall. Cross other leg in front and use it and arm for support. Lean toward wall until a stretch is felt on outside of hip near wall. Keep that leg straight. Hold __30__ seconds. Repeat with other leg. Repeat _3___ times. Do __1-3_ sessions per day.  http://gt2.exer.us/355   Copyright  VHI. All rights reserved.   Laureen Abrahams, PT, DPT 09/30/2014 11:37 AM  [facility] [address] [place] [address]  [phone] (office) [phone] (fax)

## 2014-09-30 NOTE — Telephone Encounter (Signed)
APPTS MADE AND PRINTED...TD 

## 2014-09-30 NOTE — Therapy (Signed)
Outpatient Rehabilitation [facility] [address] [place], [place], [address] Phone: [phone]   Fax:  [phone]  Physical Therapy Treatment  Patient Details  Name: Abigail [name] MRN: [id number] Date of Birth: [birth year]  Encounter Date: 09/30/2014      PT End of Session - 09/30/14 1143    Visit Number 2   Number of Visits 16   Date for PT Re-Evaluation 11/01/14   PT Start Time 1100   PT Stop Time 1140   PT Time Calculation (min) 40 min   Activity Tolerance Patient tolerated treatment well   Behavior During Therapy [facility] for tasks assessed/performed      No past medical history on file.  No past surgical history on file.  There were no vitals taken for this visit.  Visit Diagnosis:  Arthralgia of both knees  Arthritis of both knees      Subjective Assessment - 09/30/14 1104    Symptoms R hip bothering pt more than knees,  Knees still painful.  Did exercises once a day.   Limitations Standing;Walking;House hold activities   How long can you stand comfortably? varies, typically back pain increases before knee pain with standing   How long can you walk comfortably? 1 mile   Patient Stated Goals go up/down stairs, perform yardwork, walk dog   Currently in Pain? Yes   Pain Score 6    Pain Location Knee   Pain Orientation Right;Left;Anterior   Pain Descriptors / Indicators Aching   Pain Type Chronic pain   Pain Onset More than a month ago   Pain Frequency Intermittent   Aggravating Factors  prolonged standing/walking, overuse   Pain Relieving Factors injections, sitting/rest   Effect of Pain on Daily Activities negotiating stairs, yardwork, walking dog   Multiple Pain Sites Yes   Wong-Baker Pain Rating Hurts little more   Pain Type Chronic pain   Pain Location Hip   Pain Orientation Right   Pain Descriptors / Indicators Aching   Pain Onset With Activity            [facility] Adult PT Treatment/Exercise - 09/30/14 1106    Exercises   Exercises  Knee/Hip   Knee/Hip Exercises: Stretches   Passive Hamstring Stretch 30 seconds;3 reps  bil with strap   ITB Stretch 3 reps;30 seconds   Piriformis Stretch 30 seconds;3 reps  bil   Knee/Hip Exercises: Aerobic   Stationary Bike NuStep Level 4, 4 extremities x 8 min          PT Education - 09/30/14 1143    Education provided Yes   Education Details HEP   Person(s) Educated Patient   Methods Explanation;Demonstration;Handout   Comprehension Verbalized understanding;Returned demonstration;Verbal cues required;Tactile cues required;Need further instruction          PT Short Term Goals - 09/30/14 1145    PT SHORT TERM GOAL #1   Title independent with HEP   Time 4   Period Weeks   Status On-going   PT SHORT TERM GOAL #2   Title report return to community fitness at least 2 days/week for endurance and strengthening   Time 4   Period Weeks   Status On-going   PT SHORT TERM GOAL #3   Title report ability to walk 1.5 miles without increase in pain   Time 4   Period Weeks   Status On-going          PT Long Term Goals - 09/30/14 1145    PT LONG TERM GOAL #  1   Title verbalize understanding of RICE to reduce the risk of reinjury   Time 8   Period Weeks   Status On-going   PT LONG TERM GOAL #2   Title independent with advanced HEP   Time 8   Period Weeks   Status On-going   PT LONG TERM GOAL #3   Title report ability to walk 2 miles without increase in pain   Time 8   Period Weeks   Status On-going   PT LONG TERM GOAL #4   Title negotiate stairs/ambulate > 100' carrying at least 10# to simulate home and yard activities without increase in pain or LOB.   Time 8   Period Weeks   Status On-going          Plan - 09/30/14 1143    Clinical Impression Statement Pt with increased bil knee pain due to injections wearing off, and now reports R hip pain.  Added stretches to HEP and pt reported decreased pain after session.  Increased time needed for stretches; cues for  tecnique.    PT Next Visit Plan review all HEP, progress strengthening and stretching   Consulted and Agree with Plan of Care Patient                               Problem List Patient Active Problem List   Diagnosis Date Noted  . Primary localized osteoarthrosis, lower leg 06/13/2014  . Baker's cyst of knee 05/20/2014   Laureen Abrahams, PT, DPT 09/30/2014 11:46 AM  [facility] [address], [place] [address]  [phone] (office) [phone] (fax)

## 2014-10-02 ENCOUNTER — Ambulatory Visit: Payer: Medicare Other | Admitting: Physical Therapy

## 2014-10-02 DIAGNOSIS — M170 Bilateral primary osteoarthritis of knee: Secondary | ICD-10-CM

## 2014-10-02 DIAGNOSIS — M25561 Pain in right knee: Secondary | ICD-10-CM

## 2014-10-02 DIAGNOSIS — M25562 Pain in left knee: Principal | ICD-10-CM

## 2014-10-02 NOTE — Therapy (Signed)
Outpatient Rehabilitation [facility] [address] [place], [place], [address] Phone: [phone]   Fax:  [phone]  Physical Therapy Treatment  Patient Details  Name: Abigail [name] MRN: [id number] Date of Birth: 05-Sep-1941  Encounter Date: 10/02/2014      PT End of Session - 10/02/14 1139    Visit Number 3   Number of Visits 16   Date for PT Re-Evaluation 11/01/14   PT Start Time 1058   PT Stop Time 1145   PT Time Calculation (min) 47 min   Activity Tolerance Patient tolerated treatment well   Behavior During Therapy [facility] for tasks assessed/performed      No past medical history on file.  No past surgical history on file.  There were no vitals taken for this visit.  Visit Diagnosis:  Arthralgia of both knees  Arthritis of both knees      Subjective Assessment - 10/02/14 1058    Symptoms Thoracic spine hurts today, knees feel okay   Limitations Standing;Walking;House hold activities   How long can you stand comfortably? varies, typically back pain increases before knee pain with standing   How long can you walk comfortably? 1 mile   Patient Stated Goals go up/down stairs, perform yardwork, walk dog   Currently in Pain? Yes   Pain Score 0-No pain   Pain Location Knee   Pain Orientation Right;Left;Anterior   Pain Descriptors / Indicators Aching            [facility] Adult PT Treatment/Exercise - 10/02/14 1103    Knee/Hip Exercises: Stretches   ITB Stretch 3 reps;30 seconds   Piriformis Stretch 30 seconds;3 reps   Knee/Hip Exercises: Aerobic   Stationary Bike Rec bike x 8 min; Level 2   Knee/Hip Exercises: Standing   Heel Raises 1 set;15 reps   Knee Flexion Strengthening;Both;15 reps   Functional Squat 15 reps;3 seconds   Other Standing Knee Exercises standing hip ext, abdct and marching x 15 reps bil   Knee/Hip Exercises: Supine   Straight Leg Raises Strengthening;Both;10 reps          PT Education - 10/02/14 1139    Education provided  Yes   Education Details ice v. heat   Person(s) Educated Patient   Methods Explanation   Comprehension Verbalized understanding          PT Short Term Goals - 10/02/14 1140    PT SHORT TERM GOAL #1   Title independent with HEP   Time 4   Period Weeks   Status Achieved   PT SHORT TERM GOAL #2   Title report return to community fitness at least 2 days/week for endurance and strengthening   Time 4   Period Weeks   Status On-going   PT SHORT TERM GOAL #3   Title report ability to walk 1.5 miles without increase in pain   Time 4   Period Weeks   Status On-going          PT Long Term Goals - 10/02/14 1142    PT LONG TERM GOAL #1   Title verbalize understanding of RICE to reduce the risk of reinjury   Time 8   Period Weeks   Status On-going   PT LONG TERM GOAL #2   Title independent with advanced HEP   Time 8   Period Weeks   Status On-going   PT LONG TERM GOAL #3   Title report ability to walk 2 miles without increase in pain   Time  8   Period Weeks   Status On-going   PT LONG TERM GOAL #4   Title negotiate stairs/ambulate > 100' carrying at least 10# to simulate home and yard activities without increase in pain or LOB.   Time 8   Period Weeks   Status On-going          Plan - 10/02/14 1140    Clinical Impression Statement Pt reports pain in knees improved; continues to have R hip pain and new onset of rib pain.  Progressing towards goals   PT Next Visit Plan progress strengthening                               Problem List Patient Active Problem List   Diagnosis Date Noted  . Primary localized osteoarthrosis, lower leg 06/13/2014  . Baker's cyst of knee 05/20/2014   Laureen Abrahams, PT, DPT 10/02/2014 12:21 PM  [facility] [address], [place] [address]  [phone] (office) [phone] (fax)

## 2014-10-07 ENCOUNTER — Ambulatory Visit: Payer: Medicare Other | Admitting: Physical Therapy

## 2014-10-07 DIAGNOSIS — M25561 Pain in right knee: Secondary | ICD-10-CM | POA: Diagnosis not present

## 2014-10-07 DIAGNOSIS — M25562 Pain in left knee: Principal | ICD-10-CM

## 2014-10-07 DIAGNOSIS — M170 Bilateral primary osteoarthritis of knee: Secondary | ICD-10-CM

## 2014-10-07 NOTE — Patient Instructions (Signed)
Hip Flexion / Knee Extension: Straight-Leg Raise (Eccentric)   Lie on back. Lift leg with knee straight. Slowly lower leg for 3-5 seconds. _15__ reps per set, _1-2__ sets per day. Repeat with other leg.   ABDUCTION: Side-Lying (Active)   Lie on left side, top leg straight. Raise top leg as far as possible.  Complete _1__ sets of _15__ repetitions. Perform _1-2__ sessions per day. Repeat with other leg.  http://gtsc.exer.us/94   (Home) Extension: Hip   With support under abdomen, tighten stomach. Lift right leg in line with body.  Repeat _15___ times per set. Do _1_ sets per session. Do __1-2__ sessions per day. Repeat with other leg.   ADDUCTION: Side-Lying (Active)   Lie on right side, with top leg bent and in front of other leg. Lift straight leg up as high as possible.  Complete _1__ sets of _10__ repetitions. Perform _1-2__ sessions per day. Repeat with other leg.   http://gtsc.exer.us/129   Copyright  VHI. All rights reserved.   Laureen Abrahams, PT, DPT 10/07/2014 2:45 PM  [facility] [address], [place] [address]  [phone] (office) [phone] (fax)

## 2014-10-08 NOTE — Therapy (Signed)
Outpatient Rehabilitation [facility] [address] [place], [place], [address] Phone: [phone]   Fax:  [phone]  Physical Therapy Treatment  Patient Details  Name: Abigail [name] MRN: [id number] Date of Birth: [birth year]  Encounter Date: 10/07/2014      PT End of Session - 10/07/14 1456    Visit Number 4   Number of Visits 16   Date for PT Re-Evaluation 11/01/14   PT Start Time 0175   PT Stop Time 1500   PT Time Calculation (min) 45 min   Activity Tolerance Patient tolerated treatment well   Behavior During Therapy [facility] for tasks assessed/performed      No past medical history on file.  No past surgical history on file.  There were no vitals taken for this visit.  Visit Diagnosis:  Arthralgia of both knees  Arthritis of both knees      Subjective Assessment - 10/07/14 1420    Symptoms Knees feel okay, has no strength in legs   Limitations Standing;Walking;House hold activities   Patient Stated Goals go up/down stairs, perform yardwork, walk dog   Pain Score 0-No pain            [facility] Adult PT Treatment/Exercise - 10/07/14 1421    Knee/Hip Exercises: Aerobic   Stationary Bike Rec bike x 8 min resistance 4   Knee/Hip Exercises: Supine   Straight Leg Raises Strengthening;Both;15 reps   Knee/Hip Exercises: Sidelying   Hip ABduction Both;15 reps;Strengthening   Hip ADduction Both;15 reps;Strengthening   Knee/Hip Exercises: Prone   Hip Extension Strengthening;15 reps;Both   Modalities   Modalities Moist Heat   Moist Heat Therapy   Number Minutes Moist Heat 10 Minutes   Moist Heat Location Other (comment)  bil knees          PT Education - 10/07/14 1455    Education provided Yes   Education Details HEP   Person(s) Educated Patient   Methods Explanation;Demonstration;Handout   Comprehension Verbalized understanding;Returned demonstration;Need further instruction          PT Short Term Goals - 10/07/14 1457    PT SHORT TERM GOAL  #1   Title independent with HEP   Time 4   Period Weeks   Status Achieved   PT SHORT TERM GOAL #2   Title report return to community fitness at least 2 days/week for endurance and strengthening   Time 4   Period Weeks   Status On-going   PT SHORT TERM GOAL #3   Title report ability to walk 1.5 miles without increase in pain   Time 4   Period Weeks   Status On-going          PT Long Term Goals - 10/07/14 1457    PT LONG TERM GOAL #1   Title verbalize understanding of RICE to reduce the risk of reinjury   Time 8   Period Weeks   Status On-going   PT LONG TERM GOAL #2   Title independent with advanced HEP   Time 8   Period Weeks   Status On-going   PT LONG TERM GOAL #3   Title report ability to walk 2 miles without increase in pain   Time 8   Period Weeks   Status On-going   PT LONG TERM GOAL #4   Title negotiate stairs/ambulate > 100' carrying at least 10# to simulate home and yard activities without increase in pain or LOB.   Time 8   Period Weeks   Status  On-going          Plan - 10/07/14 1456    Clinical Impression Statement Pt with significantly improved knee pain, however quickly fatigues with strengthening and endurance exercises.  Will continue to benefit from PT to maximize function.   PT Next Visit Plan continue strengthening   Consulted and Agree with Plan of Care Patient                               Problem List Patient Active Problem List   Diagnosis Date Noted  . Primary localized osteoarthrosis, lower leg 06/13/2014  . Baker's cyst of knee 05/20/2014     Laureen Abrahams, PT, DPT 10/08/2014 7:56 AM  [facility] [address], [place] [address]  [phone] (office) [phone] (fax)

## 2014-10-09 ENCOUNTER — Ambulatory Visit: Payer: Medicare Other | Admitting: Physical Therapy

## 2014-10-09 DIAGNOSIS — M170 Bilateral primary osteoarthritis of knee: Secondary | ICD-10-CM

## 2014-10-09 DIAGNOSIS — M25561 Pain in right knee: Secondary | ICD-10-CM | POA: Diagnosis not present

## 2014-10-09 DIAGNOSIS — M25562 Pain in left knee: Principal | ICD-10-CM

## 2014-10-09 NOTE — Therapy (Signed)
Outpatient Rehabilitation [facility] [address] [place], [place], [address] Phone: [phone]   Fax:  [phone]  Physical Therapy Treatment  Patient Details  Name: Abigail [name] MRN: [id number] Date of Birth: 27-Apr-1941  Encounter Date: 10/09/2014      PT End of Session - 10/09/14 1239    Visit Number 5   Number of Visits 16   Date for PT Re-Evaluation 11/01/14   PT Start Time 1146   PT Stop Time 1232   PT Time Calculation (min) 46 min   Activity Tolerance Patient tolerated treatment well   Behavior During Therapy [facility] for tasks assessed/performed      No past medical history on file.  No past surgical history on file.  There were no vitals taken for this visit.  Visit Diagnosis:  Arthralgia of both knees  Arthritis of both knees      Subjective Assessment - 10/09/14 1149    Symptoms felt sore, but good after last session.  Everything felt better   Limitations Standing;Walking;House hold activities   How long can you stand comfortably? varies, typically back pain increases before knee pain with standing   How long can you walk comfortably? 1 mile   Patient Stated Goals go up/down stairs, perform yardwork, walk dog   Currently in Pain? Yes   Pain Score 5   minimal knee pain   Pain Location Hip   Pain Orientation Right   Pain Descriptors / Indicators Aching   Pain Type Chronic pain   Pain Onset More than a month ago            [facility] Adult PT Treatment/Exercise - 10/09/14 1151    Knee/Hip Exercises: Aerobic   Elliptical incline 1, resistance 1 x  min   Knee/Hip Exercises: Seated   Long Arc [employer] reps;Both   Long Arc Quad Weight 2 lbs.   Heel Slides Strengthening;Right;Left;Other (comment);10 reps   Other Seated Knee Exercises seated marching 2# x 10 reps bil   Knee/Hip Exercises: Supine   Bridges Both;15 reps  with marching   Straight Leg Raises Both;10 reps;Strengthening   Knee/Hip Exercises: Sidelying   Hip ABduction  Both;Strengthening;10 reps   Hip ADduction Both;10 reps;Strengthening   Knee/Hip Exercises: Prone   Hamstring Curl 1 set;10 reps;2 seconds   Hip Extension Strengthening;10 reps;Both          PT Education - 10/09/14 1239    Education provided No          PT Short Term Goals - 10/09/14 1240    PT SHORT TERM GOAL #1   Title independent with HEP   Time 4   Period Weeks   Status Achieved   PT SHORT TERM GOAL #2   Title report return to community fitness at least 2 days/week for endurance and strengthening   Time 4   Period Weeks   Status On-going   PT SHORT TERM GOAL #3   Title report ability to walk 1.5 miles without increase in pain   Time 4   Period Weeks   Status On-going          PT Long Term Goals - 10/09/14 1240    PT LONG TERM GOAL #1   Title verbalize understanding of RICE to reduce the risk of reinjury   Time 8   Period Weeks   Status On-going   PT LONG TERM GOAL #2   Title independent with advanced HEP   Time 8   Period Weeks  Status On-going   PT LONG TERM GOAL #3   Title report ability to walk 2 miles without increase in pain   Time 8   Period Weeks   Status On-going   PT LONG TERM GOAL #4   Title negotiate stairs/ambulate > 100' carrying at least 10# to simulate home and yard activities without increase in pain or LOB.   Time 8   Period Weeks   Status On-going          Plan - 10/09/14 1239    Clinical Impression Statement Continuing to progress with PT, anticipate transition to community and home programs next 2 visits.   PT Next Visit Plan add any further exercises to HEP; anticipate d/c 2 visits   Consulted and Agree with Plan of Care Patient                               Problem List Patient Active Problem List   Diagnosis Date Noted  . Primary localized osteoarthrosis, lower leg 06/13/2014  . Baker's cyst of knee 05/20/2014     Laureen Abrahams, PT, DPT 10/09/2014 12:42 PM  [facility] [address] [place] [address]  [phone] (office) [phone] (fax)

## 2014-10-21 ENCOUNTER — Ambulatory Visit: Payer: Medicare Other | Admitting: Physical Therapy

## 2014-10-21 DIAGNOSIS — M25561 Pain in right knee: Secondary | ICD-10-CM | POA: Diagnosis not present

## 2014-10-21 DIAGNOSIS — M25562 Pain in left knee: Principal | ICD-10-CM

## 2014-10-21 DIAGNOSIS — M170 Bilateral primary osteoarthritis of knee: Secondary | ICD-10-CM

## 2014-10-21 NOTE — Therapy (Signed)
[place], [place], [address] Phone: [phone]   Fax:  [phone]  Physical Therapy Treatment  Patient Details  Name: Abigail [name] MRN: [id number] Date of Birth: [birth year]  Encounter Date: 10/21/2014      PT End of Session - 10/21/14 1447    Visit Number 6   Number of Visits 16   Date for PT Re-Evaluation 11/01/14   PT Start Time 6962   PT Stop Time 1457   PT Time Calculation (min) 42 min   Activity Tolerance Patient tolerated treatment well   Behavior During Therapy [facility] for tasks assessed/performed      No past medical history on file.  No past surgical history on file.  There were no vitals taken for this visit.  Visit Diagnosis:  Arthralgia of both knees  Arthritis of both knees      Subjective Assessment - 10/21/14 1420    Symptoms R hip a little sore, knees feel good   Limitations Standing;Walking;House hold activities   How long can you stand comfortably? varies, typically back pain increases before knee pain with standing   How long can you walk comfortably? 1 mile   Patient Stated Goals go up/down stairs, perform yardwork, walk dog   Currently in Pain? No/denies  none currently   Aggravating Factors  sitting; going down stairs   Pain Relieving Factors injections, sitting/rest          [facility] PT Assessment - 10/21/14 1453    Observation/Other Assessments   Focus on Therapeutic Outcomes (FOTO)  42 (58% limited)                  [facility] Adult PT Treatment/Exercise - 10/21/14 1422    Knee/Hip Exercises: Stretches   Piriformis Stretch 2 reps;30 seconds   Knee/Hip Exercises: Aerobic   Stationary Bike Rec bike x 8 min resistance 4   Knee/Hip Exercises: Supine   Straight Leg Raises Both;15 reps   Knee/Hip Exercises: Sidelying   Hip ABduction Both;15 reps   Hip ADduction Both;15 reps   Knee/Hip Exercises: Prone   Hip Extension Both;15 reps   Modalities   Modalities Moist Heat   Moist Heat Therapy   Number Minutes Moist Heat 12 Minutes   Moist Heat Location Other (comment)  bil knees                PT Education - 10/21/14 1447    Education provided Yes   Education Details POC and goals of PT, indication for d/c   Person(s) Educated Patient   Methods Explanation   Comprehension Verbalized understanding          PT Short Term Goals - 10/21/14 1448    PT SHORT TERM GOAL #1   Title independent with HEP   Time 4   Period Weeks   Status Achieved   PT SHORT TERM GOAL #2   Title report return to community fitness at least 2 days/week for endurance and strengthening   Time 4   Period Weeks   Status On-going   PT SHORT TERM GOAL #3   Title report ability to walk 1.5 miles without increase in pain   Time 4   Period Weeks   Status On-going           PT Long Term Goals - 10/21/14 1449    PT LONG TERM GOAL #1   Title verbalize understanding of RICE to reduce the risk of reinjury   Time  8   Period Weeks   Status On-going   PT LONG TERM GOAL #2   Title independent with advanced HEP   Time 8   Period Weeks   Status Achieved   PT LONG TERM GOAL #3   Title report ability to walk 2 miles without increase in pain   Time 8   Period Weeks   Status On-going   PT LONG TERM GOAL #4   Title negotiate stairs/ambulate > 100' carrying at least 10# to simulate home and yard activities without increase in pain or LOB.   Time 8   Period Weeks   Status On-going               Plan - 10/21/14 1448    Clinical Impression Statement Pt progressing well and ready to transition to home and community fitness next visit.   PT Next Visit Plan d/c next visit, g-code, check remaining goals   Consulted and Agree with Plan of Care Patient        Problem List Patient Active Problem List   Diagnosis Date Noted  . Primary localized osteoarthrosis, lower leg 06/13/2014  . Baker's cyst of knee 05/20/2014    Laureen Abrahams, PT, DPT 10/21/2014 3:01 PM  [facility] [address] [place], [place], [address] Phone: [phone]   Fax:  [phone]

## 2014-10-22 ENCOUNTER — Other Ambulatory Visit: Payer: Self-pay

## 2014-10-22 DIAGNOSIS — Z1231 Encounter for screening mammogram for malignant neoplasm of breast: Secondary | ICD-10-CM

## 2014-10-23 ENCOUNTER — Ambulatory Visit: Payer: Medicare Other | Admitting: Physical Therapy

## 2014-10-23 DIAGNOSIS — M25562 Pain in left knee: Principal | ICD-10-CM

## 2014-10-23 DIAGNOSIS — M25561 Pain in right knee: Secondary | ICD-10-CM

## 2014-10-23 DIAGNOSIS — M170 Bilateral primary osteoarthritis of knee: Secondary | ICD-10-CM

## 2014-10-23 NOTE — Patient Instructions (Signed)
RICE: Routine Care for Injuries The routine care of many injuries includes Rest, Ice, Compression, and Elevation (RICE). HOME CARE INSTRUCTIONS  Rest is needed to allow your body to heal. Routine activities can usually be resumed when comfortable. Injured tendons and bones can take up to 6 weeks to heal. Tendons are the cord-like structures that attach muscle to bone.  Ice following an injury helps keep the swelling down and reduces pain.  Put ice in a plastic bag.  Place a towel between your skin and the bag.  Leave the ice on for 15-20 minutes, 3-4 times a day, or as directed by your health care provider. Do this while awake, for the first 24 to 48 hours. After that, continue as directed by your caregiver.  Compression helps keep swelling down. It also gives support and helps with discomfort. If an elastic bandage has been applied, it should be removed and reapplied every 3 to 4 hours. It should not be applied tightly, but firmly enough to keep swelling down. Watch fingers or toes for swelling, bluish discoloration, coldness, numbness, or excessive pain. If any of these problems occur, remove the bandage and reapply loosely. Contact your caregiver if these problems continue.  Elevation helps reduce swelling and decreases pain. With extremities, such as the arms, hands, legs, and feet, the injured area should be placed near or above the level of the heart, if possible. SEEK IMMEDIATE MEDICAL CARE IF:  You have persistent pain and swelling.  You develop redness, numbness, or unexpected weakness.  Your symptoms are getting worse rather than improving after several days. These symptoms may indicate that further evaluation or further X-rays are needed. Sometimes, X-rays may not show a small broken bone (fracture) until 1 week or 10 days later. Make a follow-up appointment with your caregiver. Ask when your X-ray results will be ready. Make sure you get your X-ray results. Document Released:  01/22/2001 Document Revised: 10/15/2013 Document Reviewed: 03/11/2011 [facility] Patient Information 2015 [place], [place]. This information is not intended to replace advice given to you by your health care provider. Make sure you discuss any questions you have with your health care provider.   Laureen Abrahams, PT, DPT 10/23/2014 2:24 PM  [facility] [address], [place] [address]  [phone] (office) [phone] (fax)

## 2014-10-23 NOTE — Therapy (Signed)
[place], [place], [address] Phone: [phone]   Fax:  [phone]  Physical Therapy Treatment  Patient Details  Name: Abigail [name] MRN: [id number] Date of Birth: 1941-10-24  Encounter Date: 10/23/2014      PT End of Session - 10/23/14 1444    Visit Number 7   Number of Visits 16   Date for PT Re-Evaluation 11/01/14   PT Start Time 1411   PT Stop Time 1440   PT Time Calculation (min) 29 min   Activity Tolerance Patient tolerated treatment well   Behavior During Therapy [facility] for tasks assessed/performed      No past medical history on file.  No past surgical history on file.  There were no vitals taken for this visit.  Visit Diagnosis:  Arthralgia of both knees  Arthritis of both knees      Subjective Assessment - 10/23/14 1415    Symptoms Feels good today, did exercises yesterday and everything feels better.   Limitations Standing;Walking;House hold activities   How long can you stand comfortably? varies, typically back pain increases before knee pain with standing   How long can you walk comfortably? 1 mile   Patient Stated Goals go up/down stairs, perform yardwork, walk dog   Currently in Pain? No/denies                    [facility] Adult PT Treatment/Exercise - 10/23/14 1419    Ambulation/Gait   Ambulation/Gait Yes   Ambulation Distance (Feet) 200 Feet  with 10# kettle ball without increase in pain   Stairs Yes   Stairs Assistance 7: Independent   Stair Management Technique No rails;Alternating pattern  with 10# kettle ball without increase in pain   Number of Stairs 4  x 2   Height of Stairs 6   Knee/Hip Exercises: Aerobic   Stationary Bike Rec bike x 8 min resistance 4                PT Education - 10/23/14 1443    Education provided Yes   Education Details RICE, continued community fitness including water aerobics, yoga, tai chi and pilates as low impact  strengthening for bil lower extremities   Person(s) Educated Patient   Methods Explanation;Handout   Comprehension Verbalized understanding          PT Short Term Goals - 10/23/14 1445    PT SHORT TERM GOAL #1   Title independent with HEP   Time 4   Period Weeks   Status Achieved   PT SHORT TERM GOAL #2   Title report return to community fitness at least 2 days/week for endurance and strengthening   Time 4   Period Weeks   Status Not Met   PT SHORT TERM GOAL #3   Title report ability to walk 1.5 miles without increase in pain   Time 4   Period Weeks   Status Not Met           PT Long Term Goals - 10/23/14 1445    PT LONG TERM GOAL #1   Title verbalize understanding of RICE to reduce the risk of reinjury   Time 8   Period Weeks   Status Achieved   PT LONG TERM GOAL #2   Title independent with advanced HEP   Time 8   Period Weeks   Status Achieved   PT LONG TERM GOAL #3   Title report ability to walk  2 miles without increase in pain   Time 8   Period Weeks   Status Not Met  pt reports she hasn't returned to walking yet; plans to start next week   PT LONG TERM GOAL #4   Title negotiate stairs/ambulate > 100' carrying at least 10# to simulate home and yard activities without increase in pain or LOB.   Time 8   Period Weeks   Status Achieved               Plan - 26-Oct-2014 1444    Clinical Impression Statement Pt ready for d/c and transition to community fitness.  Pt with limited return to community fitness and walking, mainly due to weather and holiday season.  Pt expresses plans to attend gym starting next week.   PT Next Visit Plan d/c PT   PT Home Exercise Plan perform as instructed   Consulted and Agree with Plan of Care Patient          G-Codes - October 26, 2014 1446    Functional Assessment Tool Used FOTO 42 (58% limited)   Functional Limitation Mobility: Walking and moving around   Mobility: Walking and Moving Around Goal Status [phone]) At least  40 percent but less than 60 percent impaired, limited or restricted   Mobility: Walking and Moving Around Discharge Status [phone]) At least 40 percent but less than 60 percent impaired, limited or restricted      Problem List Patient Active Problem List   Diagnosis Date Noted  . Primary localized osteoarthrosis, lower leg 06/13/2014  . Baker's cyst of knee 05/20/2014   Laureen Abrahams, PT, DPT 10/26/2014 2:47 PM  [facility] [address] [place], [place], [address] Phone: [phone]   Fax:  [phone]   PHYSICAL THERAPY DISCHARGE SUMMARY  Visits from Start of Care: 7  Current functional level related to goals / functional outcomes: See above; all goals met except long distance walking goal (pt had not attempted yet due to busy holiday season and weather)   Remaining deficits: Pain consistent with degenerative changes in bil knees.  Pt aware of how to effectively manage pain and maintain a healthy active lifestyle.  Pt expresses plans to return to community fitness beginning next week.   Education / Equipment: Community fitness and exercise class options, RICE  Plan: Patient agrees to discharge.  Patient goals were partially met. Patient is being discharged due to meeting the stated rehab goals.  ?????        Laureen Abrahams, PT, DPT 26-Oct-2014 2:49 PM  [facility] [address], [place] [address]  [phone] (office) [phone] (fax)

## 2014-11-05 ENCOUNTER — Ambulatory Visit
Admission: RE | Admit: 2014-11-05 | Discharge: 2014-11-05 | Disposition: A | Discharge status: Home / Self Care | Payer: PRIVATE HEALTH INSURANCE | Source: Ambulatory Visit

## 2014-11-05 DIAGNOSIS — Z1231 Encounter for screening mammogram for malignant neoplasm of breast: Secondary | ICD-10-CM

## 2014-12-29 ENCOUNTER — Encounter: Payer: Self-pay | Admitting: Family Medicine

## 2014-12-29 ENCOUNTER — Ambulatory Visit (INDEPENDENT_AMBULATORY_CARE_PROVIDER_SITE_OTHER): Payer: Medicare Other | Admitting: Family Medicine

## 2014-12-29 ENCOUNTER — Other Ambulatory Visit (INDEPENDENT_AMBULATORY_CARE_PROVIDER_SITE_OTHER): Payer: Medicare Other

## 2014-12-29 VITALS — BP 112/76 | HR 59 | Ht 66.0 in | Wt 131.0 lb

## 2014-12-29 DIAGNOSIS — M7122 Synovial cyst of popliteal space [Baker], left knee: Secondary | ICD-10-CM

## 2014-12-29 NOTE — Progress Notes (Signed)
  [name] [address] [place], [place] [address] Phone: [phone] Subjective:     CC: bilateral knee pain   DXA:[id number] Abigail [name] is a 74 y.o. female coming in with complaint of left knee pain. 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  Patient has not been seen since October. Patient states her right knee is doing relatively well. Patient has done to formal physical therapy and has been trying to work on strengthening. 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 Denies any fevers or chills and denies any radiation down the leg. Denies any significant weakness.  Past medical history, social, surgical and family history all reviewed in electronic medical record.   Review of Systems: No headache, visual changes, nausea, vomiting, diarrhea, constipation, dizziness, abdominal pain, skin rash, fevers, chills, night sweats, weight loss, swollen lymph nodes, body aches, joint swelling, muscle aches, chest pain, shortness of breath, mood changes.   Objective Blood pressure 112/76, pulse 59, height 5\' 6"  (1.676 m), weight 131 lb (59.421 kg), SpO2 98 %.  General: No apparent distress alert and oriented x3 mood and affect normal, dressed appropriately.  HEENT: Pupils equal, extraocular movements intact  Respiratory: Patient's speak in full sentences and does not appear short of breath  Cardiovascular: No lower extremity edema, non tender, no erythema  Skin: Warm dry intact with no signs of infection or rash on extremities or on axial skeleton.  Abdomen: Soft nontender  Neuro: Cranial nerves II through XII are intact, neurovascularly intact in all extremities with 2+ DTRs and 2+ pulses.  Lymph: No lymphadenopathy of posterior or anterior cervical chain or axillae  bilaterally.  Gait normal with good balance and coordination.  MSK:  Non tender with full range of motion and good stability and symmetric strength and tone of shoulders, elbows, wrist, hip,  and ankles bilaterally.  Knee: Left Normal to inspection with no erythema or effusion or obvious bony abnormalities. Patient actually does have some swelling of the posterior popliteal fossa and more than previous exam. Mild tenderness to palpation of the popliteal fossa and the medial joint line.. Range of motion lacks the last 5 of flexion. Ligaments with solid consistent endpoints including ACL, PCL, LCL, MCL. Negative Mcmurray's, Apley's, and Thessalonian tests. Non painful patellar compression. Patellar glide without crepitus. Patellar and quadriceps tendons unremarkable. Hamstring and quadriceps strength is normal.  Right knee is nontender today.   After informed written and verbal consent, patient was seated on exam table. Left knee was prepped with alcohol swab and utilizing anterolateral approach, patient's left knee space was injected with 4:1  marcaine 0.5%: Kenalog 40mg /dL. Patient tolerated the procedure well without immediate complications. Patient then went into a prone position and then with a 21-gauge 2 inch needle was injected with 2 cc of 0.5% Marcaine after sterilizing the shin over the Baker's cyst. Patient had 35 cc of fluid removed. Patient tolerated the procedure well. No signs of infection. Postinjection instructions given. Picture saved      Impression and Recommendations:     This case required medical decision making of moderate complexity.

## 2014-12-29 NOTE — Patient Instructions (Addendum)
Good to see you Abigail [name] is still your friend/. Continue to be active Try the pad the backside, consider biking shorts when sitting Continue the compression for now.  Arnica gel can help See me again in 3-4 weeks.

## 2014-12-29 NOTE — Assessment & Plan Note (Signed)
Patient did have this drained again. Patient has had this now done for a total of 3 times with me. Patient hopefully will have this where will become a less recurrent. We discussed icing regimen, home exercises and compression again. Patient will continue to remain active. We discussed the possibility of custom orthotics due to her knee arthritis as well but patient will hold off on this. Patient then will come back in 3-4 weeks for further evaluation and treatment.

## 2015-06-24 ENCOUNTER — Ambulatory Visit (INDEPENDENT_AMBULATORY_CARE_PROVIDER_SITE_OTHER): Payer: Medicare Other | Admitting: Family Medicine

## 2015-06-24 ENCOUNTER — Encounter: Payer: Self-pay | Admitting: Family Medicine

## 2015-06-24 ENCOUNTER — Other Ambulatory Visit (INDEPENDENT_AMBULATORY_CARE_PROVIDER_SITE_OTHER): Payer: Medicare Other

## 2015-06-24 VITALS — BP 110/74 | HR 58 | Ht 66.0 in | Wt 130.0 lb

## 2015-06-24 DIAGNOSIS — M25561 Pain in right knee: Secondary | ICD-10-CM

## 2015-06-24 DIAGNOSIS — M7120 Synovial cyst of popliteal space [Baker], unspecified knee: Secondary | ICD-10-CM

## 2015-06-24 DIAGNOSIS — M1710 Unilateral primary osteoarthritis, unspecified knee: Secondary | ICD-10-CM | POA: Diagnosis not present

## 2015-06-24 DIAGNOSIS — G5701 Lesion of sciatic nerve, right lower limb: Secondary | ICD-10-CM | POA: Insufficient documentation

## 2015-06-24 NOTE — Assessment & Plan Note (Signed)
Discussed home exercises, and  other activities to do a regular basis. Patient was complaining of some back pain and this is likely secondary to this knee pain. We discussed with patient that as long as it does not progressively significantly further we do not make any significant other changes. Patient would be a candidate for viscous supplementation if needed. Patient will follow-up again in 3-4 weeks for further evaluation and treatment.

## 2015-06-24 NOTE — Progress Notes (Signed)
[name] [address] [place], [place] [address] Phone: [phone] Subjective:     CC: bilateral knee pain   [id number] Abigail [name] is a 74 y.o. female coming in with complaint of left knee pain. 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  Patient was last seen 6 months ago. Patient did have a aspiration and injection of the knee at that time. Patient was to continue the compression, icing, home exercises. Patient states she is doing significant better for quite some time. Has noticed some decreasing and the range of motion. With states that very minimal pain. Not stopping her from any activities.  Patient states though that her right knee is giving her more problems. Seems to be more pain on the medial aspect of the knee. Denies any numbness but states that it can give her trouble going up and downstairs or even walking long distances. Denies any locking or giving out on her or any radiation down the leg. Rates the severity of pain a 6 out of 10.  Little increasing back pain. States that it seems to be in the right buttocks. Worse after sitting a long amount of time. Mild radiation of the leg. Very minimal. No weakness noted. Rates the severity of 4 out of 10. Starting over the course last several weeks. No injury. A dull ache. No nighttime awakening.  Past medical history, social, surgical and family history all reviewed in electronic medical record.   Review of Systems: No headache, visual changes, nausea, vomiting, diarrhea, constipation, dizziness, abdominal pain, skin rash, fevers, chills, night sweats, weight loss, swollen lymph nodes, body aches, joint swelling, muscle aches, chest pain, shortness of breath, mood changes.   Objective Blood pressure 110/74, pulse 58, height 5\' 6"  (1.676 m), weight 130 lb (58.968 kg), SpO2 99 %.    General: No apparent distress alert and oriented x3 mood and affect normal, dressed appropriately.  HEENT: Pupils equal, extraocular movements intact  Respiratory: Patient's speak in full sentences and does not appear short of breath  Cardiovascular: No lower extremity edema, non tender, no erythema  Skin: Warm dry intact with no signs of infection or rash on extremities or on axial skeleton.  Abdomen: Soft nontender  Neuro: Cranial nerves II through XII are intact, neurovascularly intact in all extremities with 2+ DTRs and 2+ pulses.  Lymph: No lymphadenopathy of posterior or anterior cervical chain or axillae bilaterally.  Gait normal with good balance and coordination.  MSK:  Non tender with full range of motion and good stability and symmetric strength and tone of shoulders, elbows, wrist, hip,  and ankles bilaterally.  Knee: Left Normal to inspection with no erythema or effusion or obvious bony abnormalities. Patient actually does have some swelling of the posterior popliteal fossa and more than previous exam. Mild tenderness to palpation of the popliteal fossa and the medial joint line.. Range of motion lacks the last 5 of flexion. Ligaments with solid consistent endpoints including ACL, PCL, LCL, MCL. Negative Mcmurray's, Apley's, and Thessalonian tests. Non painful patellar compression. Patellar glide without crepitus. Patellar and quadriceps tendons unremarkable. Hamstring and quadriceps strength is normal.  Right knee does have some mild tenderness over the medial joint line as well as somewhat on the posterior medial aspect. Negative McMurray's. Full range of motion.  Back exam shows positive Faber test. Full range of motion otherwise with  very minimal decrease in extension. Tender to palpation of the piriformis muscle itself. Mild tenderness over the sacral joint. Negative straight leg test. No weakness noted. Neurovascular intact distally.   After informed written and verbal  consent, patient was seated on exam table. Left knee was prepped with alcohol swab and utilizing anterolateral approach, patient's left knee space was injected with 4:1  marcaine 0.5%: Kenalog 40mg /dL. Patient tolerated the procedure well without immediate complications. Patient then went into a prone position and then with a 21-gauge 2 inch needle was injected with 2 cc of 0.5% Marcaine after sterilizing the shin over the Baker's cyst. Patient had 45 cc of fluid removed. Patient tolerated the procedure well. No signs of infection. Postinjection instructions given. Picture saved.   After informed written and verbal consent, patient was seated on exam table. Right knee was prepped with alcohol swab and utilizing anterolateral approach, patient's left knee space was injected with 4:1  marcaine 0.5%: Kenalog 40mg /dL. Patient tolerated the procedure well without immediate complications. Patient then went into a prone position and then with a 21-gauge 2 inch needle was injected with 2 cc of 0.5% Marcaine after sterilizing the shin over the Baker's cyst. Patient had 15 cc of fluid removed. Patient tolerated the procedure well. No signs of infection. Postinjection instructions given. Picture saved.     Impression and Recommendations:     This case required medical decision making of moderate complexity.

## 2015-06-24 NOTE — Assessment & Plan Note (Signed)
Bilateral aspiration done today. We discussed icing regimen, we discussed proper bracing, we discussed continuing to monitor. Patient does have known moderate arthritis and we may need to consider viscous supplementation. We discussed icing regimen and what activities to avoid. Patient come back and see me again in 3-4 weeks for further evaluation and treatment.

## 2015-06-24 NOTE — Assessment & Plan Note (Signed)

## 2015-06-24 NOTE — Patient Instructions (Addendum)
Great to see you Glucosamine 1500mg  daily may slow down arthritis Stay active Drained both knees today.  Ice is your friend Call insurance on the orthovisc.  See me again when you need me.

## 2015-06-24 NOTE — Progress Notes (Signed)
Pre visit review using our clinic review tool, if applicable. No additional management support is needed unless otherwise documented below in the visit note. 

## 2015-10-05 ENCOUNTER — Encounter: Payer: Self-pay | Admitting: Internal Medicine

## 2015-10-08 ENCOUNTER — Ambulatory Visit (INDEPENDENT_AMBULATORY_CARE_PROVIDER_SITE_OTHER): Payer: Medicare Other | Admitting: Family Medicine

## 2015-10-08 ENCOUNTER — Encounter: Payer: Self-pay | Admitting: Family Medicine

## 2015-10-08 ENCOUNTER — Other Ambulatory Visit (INDEPENDENT_AMBULATORY_CARE_PROVIDER_SITE_OTHER): Payer: Medicare Other

## 2015-10-08 VITALS — BP 124/82 | HR 64 | Ht 66.0 in | Wt 133.0 lb

## 2015-10-08 DIAGNOSIS — M1710 Unilateral primary osteoarthritis, unspecified knee: Secondary | ICD-10-CM | POA: Diagnosis not present

## 2015-10-08 DIAGNOSIS — M25561 Pain in right knee: Secondary | ICD-10-CM | POA: Diagnosis not present

## 2015-10-08 NOTE — Patient Instructions (Signed)
Good to see you Happy holidays! We could do synvisc in 2 weeks if you want. 1 time a week for 3-4 weeks.  Ice is your for knee.  Bike and elliptical and swimming See me again otherwise when you need me.

## 2015-10-08 NOTE — Assessment & Plan Note (Signed)
Had aspiration of the right knee pain. 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 Patient seems to be having more and more difficulty. We discussed the possibility of custom bracing which patient declined. Patient is thinking that surgical intervention would be best. We did discuss viscous supplementation as a possibility to give her more time. Patient is going to consider this. Did have near complete resolution of pain immediately after the aspiration. Patient and will come back and see me again in 4 weeks for further evaluation and we will discuss either viscous supplementation, repeat imaging, a referral to surgical intervention.  Spent  25 minutes with patient face-to-face and had greater than 50% of counseling including as described above in assessment and plan.

## 2015-10-08 NOTE — Progress Notes (Signed)
Pre visit review using our clinic review tool, if applicable. No additional management support is needed unless otherwise documented below in the visit note. 

## 2015-10-08 NOTE — Progress Notes (Signed)
[name] [address] [place], [place] [address] Phone: [phone] Subjective:     CC: bilateral knee pain   [id number] Abigail [name] is a 74 y.o. female coming in with complaint of left knee pain. 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  Patient continues to have some swelling of her knees. This time her right knee seems to be the most severe. Giving her trouble with ambulation. Naming given her a soreness at rest. Denies any radiation down her legs. Patient states though that her right knee is giving her more problems. Seems to be more pain on the medial aspect of the knee. Denies any numbness but states that it can give her trouble going up and downstairs or even walking long distances. Denies any locking or giving out on her or any radiation down the leg. Rates the severity of pain a 6 out of 10.  Little increasing back pain. States that it seems to be in the right buttocks. Worse after sitting a long amount of time. Mild radiation of the leg. Very minimal. No weakness noted. Rates the severity of 4 out of 10. Starting over the course last several weeks. No injury. A dull ache. No nighttime awakening.  Past medical history, social, surgical and family history all reviewed in electronic medical record.   Review of Systems: No headache, visual changes, nausea, vomiting, diarrhea, constipation, dizziness, abdominal pain, skin rash, fevers, chills, night sweats, weight loss, swollen lymph nodes, body aches, joint swelling, muscle aches, chest pain, shortness of breath, mood changes.   Objective Blood pressure 124/82, pulse 64, height 5\' 6"  (1.676 m), weight 133 lb (60.328 kg), SpO2 99 %.  General: No apparent distress alert and oriented x3 mood and affect normal, dressed appropriately.  HEENT: Pupils equal, extraocular movements intact    Respiratory: Patient's speak in full sentences and does not appear short of breath  Cardiovascular: No lower extremity edema, non tender, no erythema  Skin: Warm dry intact with no signs of infection or rash on extremities or on axial skeleton.  Abdomen: Soft nontender  Neuro: Cranial nerves II through XII are intact, neurovascularly intact in all extremities with 2+ DTRs and 2+ pulses.  Lymph: No lymphadenopathy of posterior or anterior cervical chain or axillae bilaterally.  Gait normal with good balance and coordination.  MSK:  Non tender with full range of motion and good stability and symmetric strength and tone of shoulders, elbows, wrist, hip,  and ankles bilaterally.  Knee: Right Normal to inspection with no erythema or effusion or obvious bony abnormalities. Patient actually does have some swelling of the posterior popliteal fossa and more than previous exam. Mild tenderness to palpation of the popliteal fossa and the medial joint line.. Range of motion lacks the last 5 of flexion. Ligaments with solid consistent endpoints including ACL, PCL, LCL, MCL. Negative Mcmurray's, Apley's, and Thessalonian tests. Non painful patellar compression. Patellar glide without crepitus. Patellar and quadriceps tendons unremarkable. Hamstring and quadriceps strength is normal.  Left knee nontender today. joint line as well as somewhat on the posterior medial aspect. Negative McMurray's. Full range of motion.  Back exam shows positive Faber test. Full range of motion otherwise with very minimal decrease in extension. Tender to palpation of the piriformis muscle itself. Mild tenderness over the sacral joint. Negative straight leg test. No weakness noted. Neurovascular intact distally. Severe  scoliosis noted      After informed written and verbal consent, patient was seated on exam table. Right knee was prepped with alcohol swab and utilizing anterolateral approach, patient's left knee space was  injected with 4:1  marcaine 0.5%: Kenalog 40mg /dL. Patient tolerated the procedure well without immediate complications. Patient then went into a prone position and then with a 21-gauge 2 inch needle was injected with 2 cc of 0.5% Marcaine after sterilizing the over the Baker's cyst. Patient had 12 cc of fluid removed. Patient tolerated the procedure well. No signs of infection. Postinjection instructions given. Picture saved.     Impression and Recommendations:     This case required medical decision making of moderate complexity.

## 2016-01-15 ENCOUNTER — Other Ambulatory Visit: Payer: Self-pay

## 2016-01-15 ENCOUNTER — Emergency Department (HOSPITAL_COMMUNITY)
Admission: EM | Admit: 2016-01-15 | Discharge: 2016-01-15 | Disposition: A | Discharge status: Home / Self Care | Payer: Medicare Other | Attending: Physician Assistant | Admitting: Physician Assistant

## 2016-01-15 ENCOUNTER — Encounter (HOSPITAL_COMMUNITY): Payer: Self-pay | Admitting: Emergency Medicine

## 2016-01-15 ENCOUNTER — Emergency Department (INDEPENDENT_AMBULATORY_CARE_PROVIDER_SITE_OTHER)
Admission: EM | Admit: 2016-01-15 | Discharge: 2016-01-15 | Disposition: A | Discharge status: Home / Self Care | Payer: Medicare Other | Source: Home / Self Care | Attending: Family Medicine | Admitting: Family Medicine

## 2016-01-15 ENCOUNTER — Emergency Department (HOSPITAL_COMMUNITY): Payer: Medicare Other

## 2016-01-15 ENCOUNTER — Encounter (HOSPITAL_COMMUNITY): Payer: Self-pay

## 2016-01-15 DIAGNOSIS — Z9889 Other specified postprocedural states: Secondary | ICD-10-CM | POA: Insufficient documentation

## 2016-01-15 DIAGNOSIS — R0789 Other chest pain: Secondary | ICD-10-CM | POA: Insufficient documentation

## 2016-01-15 DIAGNOSIS — R42 Dizziness and giddiness: Secondary | ICD-10-CM | POA: Insufficient documentation

## 2016-01-15 DIAGNOSIS — R079 Chest pain, unspecified: Secondary | ICD-10-CM

## 2016-01-15 DIAGNOSIS — E871 Hypo-osmolality and hyponatremia: Secondary | ICD-10-CM | POA: Diagnosis not present

## 2016-01-15 DIAGNOSIS — R05 Cough: Secondary | ICD-10-CM | POA: Diagnosis not present

## 2016-01-15 DIAGNOSIS — R350 Frequency of micturition: Secondary | ICD-10-CM | POA: Insufficient documentation

## 2016-01-15 LAB — I-STAT TROPONIN, ED
Troponin i, poc: 0 ng/mL (ref 0.00–0.08)
Troponin i, poc: 0 ng/mL (ref 0.00–0.08)

## 2016-01-15 LAB — BASIC METABOLIC PANEL
ANION GAP: 13 (ref 5–15)
BUN: 11 mg/dL (ref 6–20)
CALCIUM: 9.4 mg/dL (ref 8.9–10.3)
CHLORIDE: 96 mmol/L — AB (ref 101–111)
CO2: 21 mmol/L — AB (ref 22–32)
Creatinine, Ser: 0.97 mg/dL (ref 0.44–1.00)
GFR calc Af Amer: >60 mL/min (ref >60)
GFR calc non Af Amer: 56 mL/min — ABNORMAL LOW (ref >60)
GLUCOSE: 95 mg/dL (ref 65–99)
Potassium: 4 mmol/L (ref 3.5–5.1)
Sodium: 130 mmol/L — ABNORMAL LOW (ref 135–145)

## 2016-01-15 LAB — CBC
HEMATOCRIT: 42.3 % (ref 36.0–46.0)
HEMOGLOBIN: 14.4 g/dL (ref 12.0–15.0)
MCH: 31.2 pg (ref 26.0–34.0)
MCHC: 34 g/dL (ref 30.0–36.0)
MCV: 91.6 fL (ref 78.0–100.0)
Platelets: 266 10*3/uL (ref 150–400)
RBC: 4.62 MIL/uL (ref 3.87–5.11)
RDW: 12.6 % (ref 11.5–15.5)
WBC: 5.3 10*3/uL (ref 4.0–10.5)

## 2016-01-15 NOTE — Discharge Instructions (Signed)
RECOMMEND USING [facility]ILOSEC OR [place] FOR THE NEXT WEEK AND STRONGLY ENCOURAGE FOLLOW UP WITH DR. Maudie Mercury EARLY NEXT WEEK FOR RECHECK. RETURN TO THE EMERGENCY DEPARTMENT WITH ANY WORSENING SYMPTOMS, OR WITH NEW CONCERN.   Nonspecific Chest Pain  Chest pain can be caused by many different conditions. There is always a chance that your pain could be related to something serious, such as a heart attack or a blood clot in your lungs. Chest pain can also be caused by conditions that are not life-threatening. If you have chest pain, it is very important to follow up with your health care provider. CAUSES  Chest pain can be caused by:  Heartburn.  Pneumonia or bronchitis.  Anxiety or stress.  Inflammation around your heart (pericarditis) or lung (pleuritis or pleurisy).  A blood clot in your lung.  A collapsed lung (pneumothorax). It can develop suddenly on its own (spontaneous pneumothorax) or from trauma to the chest.  Shingles infection (varicella-zoster virus).  Heart attack.  Damage to the bones, muscles, and cartilage that make up your chest wall. This can include:  Bruised bones due to injury.  Strained muscles or cartilage due to frequent or repeated coughing or overwork.  Fracture to one or more ribs.  Sore cartilage due to inflammation (costochondritis). RISK FACTORS  Risk factors for chest pain may include:  Activities that increase your risk for trauma or injury to your chest.  Respiratory infections or conditions that cause frequent coughing.  Medical conditions or overeating that can cause heartburn.  Heart disease or family history of heart disease.  Conditions or health behaviors that increase your risk of developing a blood clot.  Having had chicken pox (varicella zoster). SIGNS AND SYMPTOMS Chest pain can feel like:  Burning or tingling on the surface of your chest or deep in your chest.  Crushing, pressure, aching, or squeezing pain.  Dull or sharp pain that  is worse when you move, cough, or take a deep breath.  Pain that is also felt in your back, neck, shoulder, or arm, or pain that spreads to any of these areas. Your chest pain may come and go, or it may stay constant. DIAGNOSIS Lab tests or other studies may be needed to find the cause of your pain. Your health care provider may have you take a test called an ambulatory ECG (electrocardiogram). An ECG records your heartbeat patterns at the time the test is performed. You may also have other tests, such as:  Transthoracic echocardiogram (TTE). During echocardiography, sound waves are used to create a picture of all of the heart structures and to look at how blood flows through your heart.  Transesophageal echocardiogram (TEE).This is a more advanced imaging test that obtains images from inside your body. It allows your health care provider to see your heart in finer detail.  Cardiac monitoring. This allows your health care provider to monitor your heart rate and rhythm in real time.  Holter monitor. This is a portable device that records your heartbeat and can help to diagnose abnormal heartbeats. It allows your health care provider to track your heart activity for several days, if needed.  Stress tests. These can be done through exercise or by taking medicine that makes your heart beat more quickly.  Blood tests.  Imaging tests. TREATMENT  Your treatment depends on what is causing your chest pain. Treatment may include:  Medicines. These may include:  Acid blockers for heartburn.  Anti-inflammatory medicine.  Pain medicine for inflammatory conditions.  Antibiotic  medicine, if an infection is present.  Medicines to dissolve blood clots.  Medicines to treat coronary artery disease.  Supportive care for conditions that do not require medicines. This may include:  Resting.  Applying heat or cold packs to injured areas.  Limiting activities until pain decreases. HOME CARE  INSTRUCTIONS  If you were prescribed an antibiotic medicine, finish it all even if you start to feel better.  Avoid any activities that bring on chest pain.  Do not use any tobacco products, including cigarettes, chewing tobacco, or electronic cigarettes. If you need help quitting, ask your health care provider.  Do not drink alcohol.  Take medicines only as directed by your health care provider.  Keep all follow-up visits as directed by your health care provider. This is important. This includes any further testing if your chest pain does not go away.  If heartburn is the cause for your chest pain, you may be told to keep your head raised (elevated) while sleeping. This reduces the chance that acid will go from your stomach into your esophagus.  Make lifestyle changes as directed by your health care provider. These may include:  Getting regular exercise. Ask your health care provider to suggest some activities that are safe for you.  Eating a heart-healthy diet. A registered dietitian can help you to learn healthy eating options.  Maintaining a healthy weight.  Managing diabetes, if necessary.  Reducing stress. SEEK MEDICAL CARE IF:  Your chest pain does not go away after treatment.  You have a rash with blisters on your chest.  You have a fever. SEEK IMMEDIATE MEDICAL CARE IF:   Your chest pain is worse.  You have an increasing cough, or you cough up blood.  You have severe abdominal pain.  You have severe weakness.  You faint.  You have chills.  You have sudden, unexplained chest discomfort.  You have sudden, unexplained discomfort in your arms, back, neck, or jaw.  You have shortness of breath at any time.  You suddenly start to sweat, or your skin gets clammy.  You feel nauseous or you vomit.  You suddenly feel light-headed or dizzy.  Your heart begins to beat quickly, or it feels like it is skipping beats. These symptoms may represent a serious  problem that is an emergency. Do not wait to see if the symptoms will go away. Get medical help right away. Call your local emergency services (911 in the [place] Do not drive yourself to the hospital.   This information is not intended to replace advice given to you by your health care provider. Make sure you discuss any questions you have with your health care provider.   Document Released: 07/20/2005 Document Revised: 10/31/2014 Document Reviewed: 05/16/2014 Elsevier Interactive Patient Education [employer].

## 2016-01-15 NOTE — ED Notes (Signed)
Pt sent here from [facility] due to c/o chest pain. She reports central chest discomfort onset yesterday but today she began to feel dizzy. She currently denies chest pain. She took one aspirin tablet PTA. Denies any other symptoms, NAD at triage.

## 2016-01-15 NOTE — ED Notes (Signed)
The patient presented to the [facility] with a complaint of chest discomfort that she describes as a "discomfort." The patient stated that she started noticing the chest discomfort yesterday. She stated that she has a hx of reflux and has stopped taking Prilosec and started taking an OTC herbal supplement DGL. She stated that if she takes the [facility] [employer] prior to eating she does not experience the chest discomfort.

## 2016-01-15 NOTE — ED Provider Notes (Signed)
CSN: [id number]     Arrival date & time 01/15/16  1721 History   First MD Initiated Contact with Patient 01/15/16 1807     Chief Complaint  Patient presents with  . Chest Pain   (Consider location/radiation/quality/duration/timing/severity/associated sxs/prior Treatment) Patient is a 75 y.o. female presenting with chest pain. The history is provided by the patient. No language interpreter was used.  Chest Pain Associated symptoms: nausea   Associated symptoms: no abdominal pain, no cough, no diaphoresis, no fatigue, no fever, no palpitations, no shortness of breath and not vomiting    Patient presents with complaint of generalized unease in lower chest, associated with dizziness, episodic. Had similar episode about 12 days ago after brunch, thought she woujld have to have BM but did not. Associated with some nausea without emesis. In the past several days has had it repeatedly, at rest. Described as a vague sensation that is not painful and resolves with Excedrin.  Today she had an episode which started around 230pm, lasted until 430pm and resolved after full strength aspirin and a little wine. Belching is associated as well. She does not suffer from GERd, does not have acidic taste in throat.  History of HTN, primary doctor Dr Maudie Mercury.  She had a visit with Dr Ganji/Cardiology this past summer to have an established cardiologist.  She reports that in 2002 she had presented to the ED with a similar episode, eventually went for cardiac cath and had some abnormality found which was managed medically (no angioplasty or PCI).   Social Hx; Quit smoking 35 years ago.  Physically active.  History reviewed. No pertinent past medical history. Past Surgical History  Procedure Laterality Date  . Cardiac catheterization  15 years ago   History reviewed. No pertinent family history. Social History  Substance Use Topics  . Smoking status: Never Smoker   . Smokeless tobacco: None  . Alcohol Use: None   OB  History    No data available     Review of Systems  Constitutional: Negative for fever, chills, diaphoresis and fatigue.  HENT: Negative for congestion.   Respiratory: Negative for cough, chest tightness and shortness of breath.   Cardiovascular: Positive for chest pain. Negative for palpitations.  Gastrointestinal: Positive for nausea and constipation. Negative for vomiting, abdominal pain, diarrhea and abdominal distention.  Genitourinary: Negative for dysuria.    Allergies  Review of patient's allergies indicates no known allergies.  Home Medications   Prior to Admission medications   Medication Sig Start Date End Date Taking? Authorizing Provider  losartan (COZAAR) 25 MG tablet  07/30/14  Yes Historical Provider, MD   Meds Ordered and Administered this Visit  Medications - No data to display  BP 163/81 mmHg  Pulse 66  Temp(Src) 98.2 F (36.8 C) (Oral)  Resp 12  SpO2 100% No data found.   Physical Exam  Constitutional: She appears well-developed and well-nourished. No distress.  HENT:  Head: Normocephalic and atraumatic.  Right Ear: External ear normal.  Left Ear: External ear normal.  Mouth/Throat: Oropharynx is clear and moist. No oropharyngeal exudate.  Eyes: Conjunctivae are normal. Right eye exhibits no discharge. Left eye exhibits no discharge.  Neck: Normal range of motion. Neck supple.  Cardiovascular: Normal rate, regular rhythm and normal heart sounds.   Pulmonary/Chest: Effort normal and breath sounds normal. No respiratory distress. She has no wheezes. She has no rales. She exhibits no tenderness.  Abdominal: Soft. Bowel sounds are normal. She exhibits no distension and no mass. There  is no tenderness. There is no rebound and no guarding.  Lymphadenopathy:    She has no cervical adenopathy.  Skin: She is not diaphoretic.  ECG in [facility] reviewed by me, NSR, no ischemic changes noted.   ED Course  Procedures (including critical care time)  Labs  Review Labs Reviewed - No data to display  Imaging Review No results found.   Visual Acuity Review  Right Eye Distance:   Left Eye Distance:   Bilateral Distance:    Right Eye Near:   Left Eye Near:    Bilateral Near:         MDM   1. Chest pain at rest    Patient with vague chest discomfort that is associated with belching as well as dizziness. Episodic. Risk factors include HTN, prior cath (unclear of the findings, 15 years ago). Her ECG here in [facility] is reassuring. Still, given the vagaries of the history and the recent crescendoing of the symptoms, I am recommending ED evaluation for possible unstable angina.  Discussed in detail with the patient, who is pain-free and hemodynamically stable at this time.  She expresses wish that her husband transport her to the ED at [facility]   Dalbert Mayotte, MD    Willeen Niece, MD 01/15/16 [phone]

## 2016-01-15 NOTE — ED Provider Notes (Signed)
CSN: [id number]     Arrival date & time 01/15/16  1849 History   First MD Initiated Contact with Patient 01/15/16 2053     Chief Complaint  Patient presents with  . Chest Pain     (Consider location/radiation/quality/duration/timing/severity/associated sxs/prior Treatment) HPI Comments: The patient presents with complaint of lower central chest discomfort that has been intermittent for the past 2 weeks, increasing in frequency in the last 2 days. She reports the sensation is similar to previous symptoms resolved by Prilosec. No alleviating or aggravating factors. She does say she is under a significant amount of family stress currently and feels this is contributing to her symptoms. She denies any radiation the of discomfort, SOB, nausea, vomiting, recent cough or fever. She has felt some mild dizziness but feels this may be related to a change in her Lisinopril dosing. She reports having a catheterization in 2016 that did not require stent placement. She is currently asymptomatic.   The history is provided by the patient and the spouse. No language interpreter was used.    History reviewed. No pertinent past medical history. Past Surgical History  Procedure Laterality Date  . Cardiac catheterization  15 years ago   No family history on file. Social History  Substance Use Topics  . Smoking status: Never Smoker   . Smokeless tobacco: None  . Alcohol Use: None   OB History    No data available     Review of Systems  Constitutional: Negative for fever and chills.  HENT: Negative.   Respiratory: Negative.  Negative for shortness of breath.   Cardiovascular: Positive for chest pain (See HPI.).  Gastrointestinal: Negative.   Musculoskeletal: Negative.   Skin: Negative.   Neurological: Positive for dizziness (See HPI.). Negative for syncope.      Allergies  Review of patient's allergies indicates no known allergies.  Home Medications   Prior to Admission medications    Medication Sig Start Date End Date Taking? Authorizing Provider  losartan (COZAAR) 25 MG tablet  07/30/14   Historical Provider, MD   BP 168/96 mmHg  Pulse 64  Temp(Src) 98.2 F (36.8 C) (Oral)  Resp 16  SpO2 99% Physical Exam  Constitutional: She is oriented to person, place, and time. She appears well-developed and well-nourished.  HENT:  Head: Normocephalic.  Neck: Normal range of motion. Neck supple.  Cardiovascular: Normal rate and regular rhythm.   No carotid bruit.  Pulmonary/Chest: Effort normal and breath sounds normal.  Abdominal: Soft. Bowel sounds are normal. There is no tenderness. There is no rebound and no guarding.  Musculoskeletal: Normal range of motion. She exhibits no edema.  Neurological: She is alert and oriented to person, place, and time.  Skin: Skin is warm and dry. No rash noted.  Psychiatric: She has a normal mood and affect.    ED Course  Procedures (including critical care time) Labs Review Labs Reviewed  BASIC METABOLIC PANEL - Abnormal; Notable for the following:    Sodium 130 (*)    Chloride 96 (*)    CO2 21 (*)    GFR calc non Af Amer 56 (*)    All other components within normal limits  CBC  I-STAT TROPOININ, ED    Imaging Review Dg Chest 2 View  01/15/2016  CLINICAL DATA:  Patient started feeling bad 2 weeks ago. Yesterday head midsternal chest pain with cough. EXAM: CHEST  2 VIEW COMPARISON:  None. FINDINGS: Hyperinflation. Normal heart size and pulmonary vascularity. No focal airspace disease or  consolidation in the lungs. No blunting of costophrenic angles. No pneumothorax. Mediastinal contours appear intact. Bilateral breast prostheses. Degenerative changes in the spine. IMPRESSION: No evidence of active pulmonary disease. Electronically Signed   By: Lucienne Capers M.D.   On: 01/15/2016 19:52   I have personally reviewed and evaluated these images and lab results as part of my medical decision-making.   EKG Interpretation None       MDM   Final diagnoses:  None    1. Chest discomfort  The patient has a normal EKG, normal initial troponin. She is mildly hyponatremia, unlikely to contribute to symptoms. Will obtain a delta troponin, due at 10:00 and, if negative will d/c home with PCP follow up.  10:15 - delta troponin negative. The patient is having atypical symptoms of ACS, has a normal EKG, a recent negative cath (per patient), and negative CXR/lab studies. She is currently asymptomatic. Feel that symptoms are likely GI related and have recommended treatment with PPI and close follow up with PCP.  Charlann Lange, PA-C 01/15/16 [place], MD 01/16/16 [id number]

## 2016-01-25 DIAGNOSIS — R079 Chest pain, unspecified: Secondary | ICD-10-CM | POA: Diagnosis not present

## 2016-02-01 DIAGNOSIS — I2510 Atherosclerotic heart disease of native coronary artery without angina pectoris: Secondary | ICD-10-CM | POA: Diagnosis not present

## 2016-02-01 DIAGNOSIS — E7800 Pure hypercholesterolemia, unspecified: Secondary | ICD-10-CM | POA: Diagnosis not present

## 2016-02-01 DIAGNOSIS — I10 Essential (primary) hypertension: Secondary | ICD-10-CM | POA: Diagnosis not present

## 2016-02-17 DIAGNOSIS — E785 Hyperlipidemia, unspecified: Secondary | ICD-10-CM | POA: Diagnosis not present

## 2016-02-17 DIAGNOSIS — I10 Essential (primary) hypertension: Secondary | ICD-10-CM | POA: Diagnosis not present

## 2016-02-17 DIAGNOSIS — E039 Hypothyroidism, unspecified: Secondary | ICD-10-CM | POA: Diagnosis not present

## 2016-02-23 DIAGNOSIS — I10 Essential (primary) hypertension: Secondary | ICD-10-CM | POA: Diagnosis not present

## 2016-02-25 ENCOUNTER — Other Ambulatory Visit: Payer: Self-pay

## 2016-02-25 ENCOUNTER — Ambulatory Visit (INDEPENDENT_AMBULATORY_CARE_PROVIDER_SITE_OTHER): Payer: Medicare Other | Admitting: Family Medicine

## 2016-02-25 ENCOUNTER — Encounter: Payer: Self-pay | Admitting: Family Medicine

## 2016-02-25 VITALS — BP 118/80 | HR 71 | Ht 66.0 in | Wt 128.0 lb

## 2016-02-25 DIAGNOSIS — M7120 Synovial cyst of popliteal space [Baker], unspecified knee: Secondary | ICD-10-CM | POA: Diagnosis not present

## 2016-02-25 DIAGNOSIS — M1711 Unilateral primary osteoarthritis, right knee: Secondary | ICD-10-CM

## 2016-02-25 NOTE — Assessment & Plan Note (Signed)
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 Patient was given an injection today and tolerated the procedure well. We discussed icing regimen and home exercises. Patient will likely need a knee replacement at some point but does not want have a 10 now. We discussed possible bracing which patient also declined. Follow-up 2-3 months for further evaluation and treatment.

## 2016-02-25 NOTE — Progress Notes (Signed)
Pre visit review using our clinic review tool, if applicable. No additional management support is needed unless otherwise documented below in the visit note. 

## 2016-02-25 NOTE — Progress Notes (Signed)
[name] [address], [place] [address] Phone: [phone] Subjective:     CC: bilateral knee pain follow-up  [id number] Abigail [name] is a 75 y.o. female coming in with complaint of left knee pain. 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  Patient continues to have some swelling of her knees. Last aspiration of the knee was 5 months ago. Patient states.left knee is very large at this time. Starting to affect the way she has range of motion. Has a stress test tomorrow and would like this trains she can be able to participate.  Patient is having some worsening of the right knee as well. States it hurts more on the medial aspect. Given her difficulty with going up and down stairs. 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  No past medical history on file. Past Surgical History  Procedure Laterality Date  . Cardiac catheterization  15 years ago   Social History  Substance Use Topics  . Smoking status: Never Smoker   . Smokeless tobacco: None  . Alcohol Use: None   No Known Allergies No family history on file.No family history of rheumatological diseases.   Past medical history, social, surgical and family history all reviewed in electronic medical record.   Review of Systems: No headache, visual changes, nausea, vomiting, diarrhea, constipation, dizziness, abdominal pain, skin rash, fevers, chills, night sweats, weight loss, swollen lymph nodes, body aches, joint swelling, muscle aches, chest pain, shortness of breath, mood changes.   Objective Blood pressure 118/80, pulse 71, height 5\' 6"  (1.676 m), weight 128 lb (58.06 kg), SpO2 98 %.  General: No apparent distress alert and oriented x3 mood and affect normal, dressed appropriately.  HEENT: Pupils equal, extraocular movements intact  Respiratory: Patient's speak  in full sentences and does not appear short of breath  Cardiovascular: No lower extremity edema, non tender, no erythema  Skin: Warm dry intact with no signs of infection or rash on extremities or on axial skeleton.  Abdomen: Soft nontender  Neuro: Cranial nerves II through XII are intact, neurovascularly intact in all extremities with 2+ DTRs and 2+ pulses.  Lymph: No lymphadenopathy of posterior or anterior cervical chain or axillae bilaterally.  Gait normal with good balance and coordination.  MSK:  Non tender with full range of motion and good stability and symmetric strength and tone of shoulders, elbows, wrist, hip,  and ankles bilaterally.  Knee: Right Normal to inspection with no erythema or effusion or obvious bony abnormalities.  Small Baker cyst noted on the popliteal fossa  Ligaments with solid consistent endpoints including ACL, PCL, LCL, MCL. Negative Mcmurray's, Apley's, and Thessalonian tests. Non painful patellar compression. Patellar glide without crepitus. Patellar and quadriceps tendons unremarkable. Hamstring and quadriceps strength is normal.  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  After informed written and verbal consent, patient was seated on exam table. Right knee was prepped with alcohol swab and utilizing anterolateral approach, patient's right knee space was injected with 4:1  marcaine 0.5%: Kenalog 40mg /dL. Patient tolerated the procedure well without immediate complications.  Procedure: Real-time Ultrasound Guided aspiration and injection of left knee Baker cyst Device: GE Logiq E  Ultrasound guided injection is preferred based studies that show increased duration, increased effect, greater accuracy,  decreased procedural pain, increased response rate, and decreased cost with ultrasound guided versus blind injection.  Verbal informed consent obtained.  Time-out  conducted.  Noted no overlying erythema, induration, or other signs of local infection.  Skin prepped in a sterile fashion.  Local anesthesia: Topical Ethyl chloride.  With sterile technique and under real time ultrasound guidance:  With a 21-gauge 2 inch needle patient was injected with 2 mL of 0.5% Marcaine and aspirated 40 mL of strawlike colored fluid. Then injected with 1 mL of Kenalog 40 mg/dL Completed without difficulty  Pain immediately resolved suggesting accurate placement of the medication.  Advised to call if fevers/chills, erythema, induration, drainage, or persistent bleeding.  Images permanently stored and available for review in the ultrasound unit.  Impression: Technically successful ultrasound guided injection.     Impression and Recommendations:     This case required medical decision making of moderate complexity.

## 2016-02-25 NOTE — Patient Instructions (Signed)
Great to see you  Abigail [name] is your friend still \ Good luck on the test  Stay active.  Tylenol 650 mg 3 times a day scheduled OK to take with aspirin See me when you need me and we can do other injections

## 2016-02-26 DIAGNOSIS — R0789 Other chest pain: Secondary | ICD-10-CM | POA: Diagnosis not present

## 2016-03-03 DIAGNOSIS — E7800 Pure hypercholesterolemia, unspecified: Secondary | ICD-10-CM | POA: Diagnosis not present

## 2016-03-03 DIAGNOSIS — I10 Essential (primary) hypertension: Secondary | ICD-10-CM | POA: Diagnosis not present

## 2016-03-03 DIAGNOSIS — I2510 Atherosclerotic heart disease of native coronary artery without angina pectoris: Secondary | ICD-10-CM | POA: Diagnosis not present

## 2016-03-18 NOTE — Assessment & Plan Note (Signed)
Patient did have aspiration done today. We did discuss with this in great detail. Because patient's underlying arthritis is likely will be continuing. Due to the arthritis as well ultrasound guidance as necessary. Patient has responded well. Hopefully compression will help. Continue conservative therapy.

## 2016-03-24 DIAGNOSIS — I10 Essential (primary) hypertension: Secondary | ICD-10-CM | POA: Diagnosis not present

## 2016-03-24 DIAGNOSIS — E7800 Pure hypercholesterolemia, unspecified: Secondary | ICD-10-CM | POA: Diagnosis not present

## 2016-03-24 DIAGNOSIS — E039 Hypothyroidism, unspecified: Secondary | ICD-10-CM | POA: Diagnosis not present

## 2016-04-05 DIAGNOSIS — H5211 Myopia, right eye: Secondary | ICD-10-CM | POA: Diagnosis not present

## 2016-04-05 DIAGNOSIS — H01004 Unspecified blepharitis left upper eyelid: Secondary | ICD-10-CM | POA: Diagnosis not present

## 2016-04-05 DIAGNOSIS — H01001 Unspecified blepharitis right upper eyelid: Secondary | ICD-10-CM | POA: Diagnosis not present

## 2016-04-05 DIAGNOSIS — H26493 Other secondary cataract, bilateral: Secondary | ICD-10-CM | POA: Diagnosis not present

## 2016-05-25 DIAGNOSIS — L718 Other rosacea: Secondary | ICD-10-CM | POA: Diagnosis not present

## 2016-05-25 DIAGNOSIS — L570 Actinic keratosis: Secondary | ICD-10-CM | POA: Diagnosis not present

## 2016-05-25 DIAGNOSIS — X32XXXD Exposure to sunlight, subsequent encounter: Secondary | ICD-10-CM | POA: Diagnosis not present

## 2016-05-25 DIAGNOSIS — L308 Other specified dermatitis: Secondary | ICD-10-CM | POA: Diagnosis not present

## 2016-06-20 DIAGNOSIS — I10 Essential (primary) hypertension: Secondary | ICD-10-CM | POA: Diagnosis not present

## 2016-06-20 DIAGNOSIS — E039 Hypothyroidism, unspecified: Secondary | ICD-10-CM | POA: Diagnosis not present

## 2016-06-24 DIAGNOSIS — Z23 Encounter for immunization: Secondary | ICD-10-CM | POA: Diagnosis not present

## 2016-06-24 DIAGNOSIS — I10 Essential (primary) hypertension: Secondary | ICD-10-CM | POA: Diagnosis not present

## 2016-06-24 DIAGNOSIS — E785 Hyperlipidemia, unspecified: Secondary | ICD-10-CM | POA: Diagnosis not present

## 2016-06-24 DIAGNOSIS — E039 Hypothyroidism, unspecified: Secondary | ICD-10-CM | POA: Diagnosis not present

## 2016-07-05 NOTE — Progress Notes (Signed)
[name] [address], [place] [address] Phone: [phone] Subjective:     CC: bilateral knee pain follow-up  [id number]  Abigail [name] is a 75 y.o. female coming in with complaint of left knee pain. 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  Patient continues to have some swelling of her knees. Last aspiration of the knee was 4 months ago Patient though unfortunately had an injury recently. Was getting up from a seated position and had a loud audible pop. Swelling immediately. Instability that seems to be severe of the left knee.  Patient is having some worsening of the right knee as well. States it hurts more on the medial aspect. Given her difficulty with going up and down stairs. 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  No past medical history on file. Past Surgical History:  Procedure Laterality Date  . CARDIAC CATHETERIZATION  15 years ago   Social History  Substance Use Topics  . Smoking status: Never Smoker  . Smokeless tobacco: Not on file  . Alcohol use Not on file   No Known Allergies No family history on file.No family history of rheumatological diseases.   Past medical history, social, surgical and family history all reviewed in electronic medical record.   Review of Systems: No headache, visual changes, nausea, vomiting, diarrhea, constipation, dizziness, abdominal pain, skin rash, fevers, chills, night sweats, weight loss, swollen lymph nodes, body aches, joint swelling, muscle aches, chest pain, shortness of breath, mood changes.   Objective  There were no vitals taken for this visit.  General: No apparent distress alert and oriented x3 mood and affect normal, dressed appropriately.  HEENT: Pupils equal, extraocular movements intact  Respiratory: Patient's speak in full sentences and does not  appear short of breath  Cardiovascular: No lower extremity edema, non tender, no erythema  Skin: Warm dry intact with no signs of infection or rash on extremities or on axial skeleton.  Abdomen: Soft nontender  Neuro: Cranial nerves II through XII are intact, neurovascularly intact in all extremities with 2+ DTRs and 2+ pulses.  Lymph: No lymphadenopathy of posterior or anterior cervical chain or axillae bilaterally.  Gait normal with good balance and coordination.  MSK:  Non tender with full range of motion and good stability and symmetric strength and tone of shoulders, elbows, wrist, hip,  and ankles bilaterally.  Knee: Bilateral Normal to inspection with no erythema or effusion or obvious bony abnormalities.  Small Baker cyst noted on the popliteal fossa  Instability noted with valgus force bilaterally. Patient has full gapping of the LCL on the left side. Negative Mcmurray's, Apley's, and Thessalonian tests. Non painful patellar compression. Patellar glide without crepitus. Patellar and quadriceps tendons unremarkable. Hamstring and quadriceps strength is normal.  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  After informed written and verbal consent, patient was seated on exam table. Right knee was prepped with alcohol swab and utilizing anterolateral approach, patient's right knee space was injected with 4:1  marcaine 0.5%: Kenalog 40mg /dL. Patient tolerated the procedure well without immediate complications.  Procedure: Real-time Ultrasound Guided aspiration and injection of left knee Baker cyst Device: GE Logiq E  Ultrasound guided injection is preferred based studies that show increased duration, increased effect, greater accuracy, decreased  procedural pain, increased response rate, and decreased cost with ultrasound guided versus blind injection.  Verbal informed consent obtained.    Time-out conducted.  Noted no overlying erythema, induration, or other signs of local infection.  Skin prepped in a sterile fashion.  Local anesthesia: Topical Ethyl chloride.  With sterile technique and under real time ultrasound guidance:  With a 21-gauge 2 inch needle patient was injected with 2 mL of 0.5% Marcaine and aspirated 50 mL of strawlike colored fluid. Then injected with 1 mL of Kenalog 40 mg/dL Completed without difficulty  Pain immediately resolved suggesting accurate placement of the medication.  Advised to call if fevers/chills, erythema, induration, drainage, or persistent bleeding.  Images permanently stored and available for review in the ultrasound unit.  Impression: Technically successful ultrasound guided injection.      Impression and Recommendations:     This case required medical decision making of moderate complexity.

## 2016-07-06 ENCOUNTER — Ambulatory Visit (INDEPENDENT_AMBULATORY_CARE_PROVIDER_SITE_OTHER): Payer: Medicare Other | Admitting: Family Medicine

## 2016-07-06 ENCOUNTER — Encounter: Payer: Self-pay | Admitting: Family Medicine

## 2016-07-06 ENCOUNTER — Other Ambulatory Visit: Payer: Self-pay

## 2016-07-06 VITALS — BP 120/84 | HR 65

## 2016-07-06 DIAGNOSIS — M25562 Pain in left knee: Secondary | ICD-10-CM

## 2016-07-06 DIAGNOSIS — M1711 Unilateral primary osteoarthritis, right knee: Secondary | ICD-10-CM

## 2016-07-06 DIAGNOSIS — M7122 Synovial cyst of popliteal space [Baker], left knee: Secondary | ICD-10-CM

## 2016-07-06 DIAGNOSIS — M1712 Unilateral primary osteoarthritis, left knee: Secondary | ICD-10-CM | POA: Diagnosis not present

## 2016-07-06 MED ORDER — GABAPENTIN 100 MG PO CAPS: 200.0000 mg | ORAL_CAPSULE | Freq: Every day | ORAL | 3 refills | Status: DC

## 2016-07-06 NOTE — Patient Instructions (Signed)
Good to see you  Ice is your friend Stability brace is a good idea if not doing surgery, they will call you We drained your knee again  Gabapentin 200mg  at night Physical therapy will be calling you  See me again in 4 weeks.

## 2016-07-06 NOTE — Assessment & Plan Note (Signed)
Patient had another injection today. Tolerated the procedure well. She could be a candidate for viscous supple mentation. Has failed all other conservative therapy at this time. Patient though does have an LCL injury on the left side as well as a could be contribute in. We discussed custom bracing which patient will consider. Patient come back and see me again in 3-4 weeks. We will need knee replacement at some time.

## 2016-07-06 NOTE — Assessment & Plan Note (Signed)
Has responded well to injections in the past. We'll continue to monitor. Patient will try to take this seems to be helpful. Patient wants to gabapentin to help with nighttime pain. Patient will come back in 3-4 weeks. Also could be a candidate for viscous supple mentation.

## 2016-07-21 DIAGNOSIS — M1712 Unilateral primary osteoarthritis, left knee: Secondary | ICD-10-CM | POA: Diagnosis not present

## 2016-08-08 NOTE — Progress Notes (Signed)
  [name] [address], [place] [address] Phone: [phone] Subjective:     CC: bilateral knee pain follow-up  [id number]  Abigail [name] is a 75 y.o. female coming in with complaint of left knee pain. 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  Patient continues to have some swelling of her knees. Last aspiration of the knee was 4 months ago Patient though unfortunately had an injury recently. Was getting up from a seated position and had a loud audible pop. Swelling immediately. Instability that seems to be severe of the left knee.Doesn't have an LCL tear. Patient states that the swelling in the pain is significantly improved. Not having as much pain. Patient states though continues to have some instability if she is careful.  Continues to have pain on the right knee. Does have end-stage are severe arthritic changes. 9 wearing the brace on a regular basis.  No past medical history on file. Past Surgical History:  Procedure Laterality Date  . CARDIAC CATHETERIZATION  15 years ago   Social History  Substance Use Topics  . Smoking status: Never Smoker  . Smokeless tobacco: Not on file  . Alcohol use Not on file   No Known Allergies No family history on file.No family history of rheumatological diseases.   Past medical history, social, surgical and family history all reviewed in electronic medical record.   Review of Systems: No headache, visual changes, nausea, vomiting, diarrhea, constipation, dizziness, abdominal pain, skin rash, fevers, chills, night sweats, weight loss, swollen lymph nodes, body aches, joint swelling, muscle aches, chest pain, shortness of breath, mood changes.   Objective  There were no vitals taken for this visit.  General: No apparent distress alert and oriented x3 mood and affect normal, dressed  appropriately.  HEENT: Pupils equal, extraocular movements intact  Respiratory: Patient's speak in full sentences and does not appear short of breath  Cardiovascular: No lower extremity edema, non tender, no erythema  Skin: Warm dry intact with no signs of infection or rash on extremities or on axial skeleton.  Abdomen: Soft nontender  Neuro: Cranial nerves II through XII are intact, neurovascularly intact in all extremities with 2+ DTRs and 2+ pulses.  Lymph: No lymphadenopathy of posterior or anterior cervical chain or axillae bilaterally.  Gait normal with good balance and coordination.  MSK:  Non tender with full range of motion and good stability and symmetric strength and tone of shoulders, elbows, wrist, hip,  and ankles bilaterally.  Knee: Left Left knee still have gapping noted. Possibly less than previous exam. Still has instability. Patient does have crepitus with range of motion. Minorly tender to palpation over the medial and lateral joint line.  Right knee does have valgus deformity. In significant pain over the medial joint line. Instability with valgus force. Neurovascular intact distally. 4 out of 5 strength compared to the contralateral side  \     Impression and Recommendations:     This case required medical decision making of moderate complexity.

## 2016-08-09 ENCOUNTER — Encounter: Payer: Self-pay | Admitting: Family Medicine

## 2016-08-09 ENCOUNTER — Ambulatory Visit (INDEPENDENT_AMBULATORY_CARE_PROVIDER_SITE_OTHER): Payer: Medicare Other | Admitting: Family Medicine

## 2016-08-09 DIAGNOSIS — M7122 Synovial cyst of popliteal space [Baker], left knee: Secondary | ICD-10-CM | POA: Diagnosis not present

## 2016-08-09 DIAGNOSIS — M1712 Unilateral primary osteoarthritis, left knee: Secondary | ICD-10-CM

## 2016-08-09 DIAGNOSIS — S83422D Sprain of lateral collateral ligament of left knee, subsequent encounter: Secondary | ICD-10-CM

## 2016-08-09 DIAGNOSIS — M1711 Unilateral primary osteoarthritis, right knee: Secondary | ICD-10-CM

## 2016-08-09 NOTE — Assessment & Plan Note (Signed)
The patient declined viscous supplementation. Encourage her to continue the home exercises and icing pedicle. Follow-up again in 4-6 weeks

## 2016-08-09 NOTE — Assessment & Plan Note (Signed)
Slow healing f/u in 4-6 weeks should do ultrasound.  COntinue HEP avoid twisting, no surgical intervention due to patient's underlying arthritis.

## 2016-08-09 NOTE — Patient Instructions (Addendum)
Good to see you You are making progress  Stay active but watch twisting.  See me again in 4-6 weeks to make sure the ligament is better

## 2016-08-09 NOTE — Assessment & Plan Note (Signed)
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 Discussed with patient about monitoring. Decline wearing the brace on a regular basis. Patient will continue the gabapentin at a low dose. Do feel that there could be some low back problems I could be contribute in. Patient will remain active. Follow-up again for 6 weeks to make sure healing appropriate.

## 2016-08-09 NOTE — Assessment & Plan Note (Signed)
Continue to monitor at this time. Worsening symptoms we'll consider aspiration again. Patient will need a knee replacement at some point.

## 2016-09-25 NOTE — Progress Notes (Signed)
  [name] [address] [place], [place] [address] Phone: [phone] Subjective:     CC: bilateral knee pain follow-up  [id number]  Abigail [name] is a 75 y.o. female coming in with complaint of left knee pain. 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  Patient continues to have some swelling of her kneesStates it is somewhat better though than what it was previously. Not having any instability. Did have a LCL tear. Patient though states that she has been avoiding certain activities seem to exacerbate it and is doing fine at this time. Has noticed some mild swelling again in the left knee but very mild. Overall feels like she is doing relatively well.  Continues to have pain on the right knee. Does have end-stage are severe arthritic changes. Not wearing any brace..  No past medical history on file. Past Surgical History:  Procedure Laterality Date  . CARDIAC CATHETERIZATION  15 years ago   Social History  Substance Use Topics  . Smoking status: Never Smoker  . Smokeless tobacco: Not on file  . Alcohol use Not on file   No Known Allergies No family history on file.No family history of rheumatological diseases.   Past medical history, social, surgical and family history all reviewed in electronic medical record.   Review of Systems: No headache, visual changes, nausea, vomiting, diarrhea, constipation, dizziness, abdominal pain, skin rash, fevers, chills, night sweats, weight loss, swollen lymph nodes,chest pain, shortness of breath, mood changes.   Objective  Blood pressure 116/80, pulse 64, weight 127 lb (57.6 kg), SpO2 99 %.  Systems examined below as of 09/26/16 General: NAD A&O x3 mood, affect normal  HEENT: Pupils equal, extraocular movements intact no nystagmus Respiratory: not short of breath at rest or with  speaking Cardiovascular: No lower extremity edema, non tender Skin: Warm dry intact with no signs of infection or rash on extremities or on axial skeleton. Abdomen: Soft nontender, no masses Neuro: Cranial nerves  intact, neurovascularly intact in all extremities with 2+ DTRs and 2+ pulses. Lymph: No lymphadenopathy appreciated today  Gait normal with good balance and coordination.  MSK:  Non tender with full range of motion and good stability and symmetric strength and tone of shoulders, elbows, wrist, hip, and ankles bilaterally.  Knee: Left Left knee still Has instability of valgus force. Mild improvement may be from previous exam. Continues to have pain overall. Patient pain mostly over the lateral medial joint lines. Mild crepitus with range of motion.Marland Kitchen Large Baker cyst palpated posteriorly  Right knee does have valgus deformity. Continued instability and continued pain over the medial lateral joint lines more pain over the patellofemoral joint as well  \ Limited muscular skeletal ultrasound was performed and interpreted by [name]  Limited exam of patient's knee show moderate arthritic changes. 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 Large Baker cyst. Impression. 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

## 2016-09-26 ENCOUNTER — Ambulatory Visit (INDEPENDENT_AMBULATORY_CARE_PROVIDER_SITE_OTHER): Payer: Medicare Other | Admitting: Family Medicine

## 2016-09-26 ENCOUNTER — Ambulatory Visit: Payer: Self-pay

## 2016-09-26 ENCOUNTER — Encounter: Payer: Self-pay | Admitting: Family Medicine

## 2016-09-26 VITALS — BP 116/80 | HR 64 | Wt 127.0 lb

## 2016-09-26 DIAGNOSIS — G8929 Other chronic pain: Secondary | ICD-10-CM

## 2016-09-26 DIAGNOSIS — M1712 Unilateral primary osteoarthritis, left knee: Secondary | ICD-10-CM | POA: Diagnosis not present

## 2016-09-26 DIAGNOSIS — S83422D Sprain of lateral collateral ligament of left knee, subsequent encounter: Secondary | ICD-10-CM

## 2016-09-26 DIAGNOSIS — M1711 Unilateral primary osteoarthritis, right knee: Secondary | ICD-10-CM | POA: Diagnosis not present

## 2016-09-26 DIAGNOSIS — M25562 Pain in left knee: Principal | ICD-10-CM

## 2016-09-26 DIAGNOSIS — M7122 Synovial cyst of popliteal space [Baker], left knee: Secondary | ICD-10-CM | POA: Diagnosis not present

## 2016-09-26 NOTE — Assessment & Plan Note (Signed)
Given a brace today. We discussed we can repeat injections if needed. Patient was to hold off at this time.

## 2016-09-26 NOTE — Assessment & Plan Note (Addendum)
Mildly from previous exam. Believe the patient's arthritis is going to keep this from healing appropriately 90 believe the patient's mild instability is more secondary to the arthritis that it is truly from this tear. Patient has elected to continue with conservative therapy. We discussed icing regimen. Discussed home exercises, we discussed which activities to do in which ones to potentially avoid. Patient knows if worsening symptoms to come back and we will consider further treatment options.

## 2016-09-26 NOTE — Patient Instructions (Signed)
Good to see you  Ice when you need it I am ok with you increasing your activity now.  No twisting motions if you can help it.  See me again when you need that drained.  I would make another appointment in 3-6 weeks just in case.  Happy holidays!

## 2016-09-26 NOTE — Assessment & Plan Note (Signed)
Stable. Patient's may need knee viscous supplementation at some point but is not limited to it at this time. Follow-up again in 3-6 weeks

## 2016-09-26 NOTE — Assessment & Plan Note (Signed)
Does have significant swelling again at this time the patient does not want to have a drain. It has been 6 months since previous strain. Patient will continue to be active otherwise. Follow-up again in 4-6 weeks.

## 2016-10-28 ENCOUNTER — Ambulatory Visit (INDEPENDENT_AMBULATORY_CARE_PROVIDER_SITE_OTHER): Payer: Medicare Other | Admitting: Family Medicine

## 2016-10-28 ENCOUNTER — Ambulatory Visit: Payer: Self-pay

## 2016-10-28 ENCOUNTER — Encounter: Payer: Self-pay | Admitting: Family Medicine

## 2016-10-28 VITALS — BP 130/82 | HR 80 | Ht 66.0 in

## 2016-10-28 DIAGNOSIS — S83422D Sprain of lateral collateral ligament of left knee, subsequent encounter: Secondary | ICD-10-CM

## 2016-10-28 DIAGNOSIS — M1712 Unilateral primary osteoarthritis, left knee: Secondary | ICD-10-CM

## 2016-10-28 DIAGNOSIS — M7122 Synovial cyst of popliteal space [Baker], left knee: Secondary | ICD-10-CM

## 2016-10-28 NOTE — Progress Notes (Signed)
  [name] [address] [place], [place] [address] Phone: [phone] Subjective:     CC: Left knee pain follow-up  [id number]  Abigail [name] is a 76 y.o. female coming in with complaint of left knee pain. 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  Patient feels that the Baker cyst in the left knee is enlarging again. Causing pain. Affecting daily activities. Worsening symptoms. Rates the severity pain is 8 out of 10. Starting to give her more instability as well. .  No past medical history on file. Past Surgical History:  Procedure Laterality Date  . CARDIAC CATHETERIZATION  15 years ago   Social History  Substance Use Topics  . Smoking status: Never Smoker  . Smokeless tobacco: Not on file  . Alcohol use Not on file   No Known Allergies No family history on file.No family history of rheumatological diseases.   Past medical history, social, surgical and family history all reviewed in electronic medical record.   Review of Systems: No headache, visual changes, nausea, vomiting, diarrhea, constipation, dizziness, abdominal pain, skin rash, fevers, chills, night sweats, weight loss, swollen lymph nodes, body aches, joint swelling, muscle aches, chest pain, shortness of breath, mood changes.    Objective  Blood pressure 130/82, pulse 80, height 5\' 6"  (1.676 m), SpO2 96 %.  Systems examined below as of 10/28/16 General: NAD A&O x3 mood, affect normal  HEENT: Pupils equal, extraocular movements intact no nystagmus Respiratory: not short of breath at rest or with speaking Cardiovascular: No lower extremity edema, non tender Skin: Warm dry intact with no signs of infection or rash on extremities or on axial skeleton. Abdomen: Soft nontender, no masses Neuro: Cranial nerves  intact, neurovascularly intact in all extremities with 2+ DTRs and 2+  pulses. Lymph: No lymphadenopathy appreciated today  Gait normal with good balance and coordination.  MSK: Non tender with full range of motion and good stability and symmetric strength and tone of shoulders, elbows, wrist,  hips and ankles bilaterally.   Knee: Left Left knee still Has instability of valgus force. Worsening symptoms. Enlargement of the Baker cyst. Decreased flexion of the last 20 secondary to the Baker cyst and pain.  Procedure: Real-time Ultrasound Guided Injection of left knee Device: GE Logiq E  Ultrasound guided injection is preferred based studies that show increased duration, increased effect, greater accuracy, decreased procedural pain, increased response rate, and decreased cost with ultrasound guided versus blind injection.  Verbal informed consent obtained.  Time-out conducted.  Noted no overlying erythema, induration, or other signs of local infection.  Skin prepped in a sterile fashion.  Local anesthesia: Topical Ethyl chloride.  With sterile technique and under real time ultrasound guidance: With a 22-gauge 2 inch needle patient was injected with 4 cc of 0.5% Marcaine and aspirated 45 mL of strawlike fluid. Then injected 1 cc of Kenalog 40 mg/dL. This was from a superior lateral approach.  Completed without difficulty  Pain immediately resolved suggesting accurate placement of the medication.  Advised to call if fevers/chills, erythema, induration, drainage, or persistent bleeding.  Images permanently stored and available for review in the ultrasound unit.  Impression: Technically successful ultrasound guided injection.      Impression and Recommendations:     This case required medical decision making of moderate complexity.

## 2016-10-28 NOTE — Assessment & Plan Note (Signed)
Patient was having worsening symptoms. Did respond well to the aspiration today. We discussed wearing a brace on a more regular basis. We discussed and possible intervention such as a repair or replacement will be necessary. Follow-up again in 10 weeks if necessary.

## 2016-10-28 NOTE — Patient Instructions (Signed)
Good to see you  Abigail [name] is your friend You know the drill  See me when you need me

## 2016-12-02 DIAGNOSIS — H26493 Other secondary cataract, bilateral: Secondary | ICD-10-CM | POA: Diagnosis not present

## 2016-12-02 DIAGNOSIS — H5211 Myopia, right eye: Secondary | ICD-10-CM | POA: Diagnosis not present

## 2016-12-02 DIAGNOSIS — H524 Presbyopia: Secondary | ICD-10-CM | POA: Diagnosis not present

## 2016-12-22 DIAGNOSIS — H26492 Other secondary cataract, left eye: Secondary | ICD-10-CM | POA: Diagnosis not present

## 2016-12-29 DIAGNOSIS — H26491 Other secondary cataract, right eye: Secondary | ICD-10-CM | POA: Diagnosis not present

## 2017-02-01 ENCOUNTER — Other Ambulatory Visit: Payer: Self-pay | Admitting: Obstetrics and Gynecology

## 2017-02-01 DIAGNOSIS — Z1231 Encounter for screening mammogram for malignant neoplasm of breast: Secondary | ICD-10-CM

## 2017-02-07 DIAGNOSIS — Z124 Encounter for screening for malignant neoplasm of cervix: Secondary | ICD-10-CM | POA: Diagnosis not present

## 2017-02-07 DIAGNOSIS — Z1389 Encounter for screening for other disorder: Secondary | ICD-10-CM | POA: Diagnosis not present

## 2017-02-07 DIAGNOSIS — Z01419 Encounter for gynecological examination (general) (routine) without abnormal findings: Secondary | ICD-10-CM | POA: Diagnosis not present

## 2017-02-21 ENCOUNTER — Ambulatory Visit
Admission: RE | Admit: 2017-02-21 | Discharge: 2017-02-21 | Disposition: A | Discharge status: Home / Self Care | Payer: Medicare Other | Source: Ambulatory Visit | Attending: Obstetrics and Gynecology | Admitting: Obstetrics and Gynecology

## 2017-02-21 DIAGNOSIS — Z1231 Encounter for screening mammogram for malignant neoplasm of breast: Secondary | ICD-10-CM | POA: Diagnosis not present

## 2017-03-23 DIAGNOSIS — E039 Hypothyroidism, unspecified: Secondary | ICD-10-CM | POA: Diagnosis not present

## 2017-03-23 DIAGNOSIS — I10 Essential (primary) hypertension: Secondary | ICD-10-CM | POA: Diagnosis not present

## 2017-03-30 DIAGNOSIS — I10 Essential (primary) hypertension: Secondary | ICD-10-CM | POA: Diagnosis not present

## 2017-03-30 DIAGNOSIS — Z0000 Encounter for general adult medical examination without abnormal findings: Secondary | ICD-10-CM | POA: Diagnosis not present

## 2017-03-30 DIAGNOSIS — E785 Hyperlipidemia, unspecified: Secondary | ICD-10-CM | POA: Diagnosis not present

## 2017-03-30 DIAGNOSIS — E039 Hypothyroidism, unspecified: Secondary | ICD-10-CM | POA: Diagnosis not present

## 2017-03-30 DIAGNOSIS — Z23 Encounter for immunization: Secondary | ICD-10-CM | POA: Diagnosis not present

## 2017-04-28 ENCOUNTER — Ambulatory Visit: Payer: Self-pay

## 2017-04-28 ENCOUNTER — Other Ambulatory Visit: Payer: Medicare Other

## 2017-04-28 ENCOUNTER — Encounter: Payer: Self-pay | Admitting: Family Medicine

## 2017-04-28 ENCOUNTER — Ambulatory Visit (INDEPENDENT_AMBULATORY_CARE_PROVIDER_SITE_OTHER): Payer: Medicare Other | Admitting: Family Medicine

## 2017-04-28 VITALS — BP 110/78 | HR 64 | Ht 66.0 in | Wt 124.0 lb

## 2017-04-28 DIAGNOSIS — G8929 Other chronic pain: Secondary | ICD-10-CM

## 2017-04-28 DIAGNOSIS — M25562 Pain in left knee: Principal | ICD-10-CM

## 2017-04-28 DIAGNOSIS — M7122 Synovial cyst of popliteal space [Baker], left knee: Secondary | ICD-10-CM

## 2017-04-28 MED ORDER — DOXYCYCLINE HYCLATE 100 MG PO TABS: 100.0000 mg | ORAL_TABLET | Freq: Two times a day (BID) | ORAL | 0 refills | Status: AC

## 2017-04-28 NOTE — Patient Instructions (Signed)
Good to see you  Used the magic again.  Hope we get another 6 months.  Ice is your friend.  You know where I am if you need me.  Have a great weekend.

## 2017-04-28 NOTE — Assessment & Plan Note (Addendum)
Worsening pain. Aspirated again today. With the white substances was sent to labs to further evaluate. Differential includes gout, chondrocalcinosis, less likely any type of infectious etiology, possible significant amount of loose bodies. Could be also a abnormal ganglion cyst. On ultrasound does significant vascularization noted. Pending on findings we'll discuss any further imaging is needed. Sinuses helps him follow up every 3 months for repeat aspiration. Patient knows joint replacement would be healing curative measure.

## 2017-04-28 NOTE — Progress Notes (Signed)
[name] [place], [place] [address] Phone: [phone] Subjective:    I'm seeing this patient by the request  of:    CC: Left knee pain follow-up  [id number]  Abigail [name] is a 76 y.o. female coming in with complaint of knee pain. Left-sided. Patient has had a Baker's cyst tear previously. Increase in size again. Worsening pain. Patient states and decreasing range of motion again. Once to stay active but finds it difficult. Last aspiration with 6 months ago.     No past medical history on file. Past Surgical History:  Procedure Laterality Date  . AUGMENTATION MAMMAPLASTY Bilateral 1990   saline  . CARDIAC CATHETERIZATION  15 years ago   Social History   Social History  . Marital status: Married    Spouse name: N/A  . Number of children: N/A  . Years of education: N/A   Social History Main Topics  . Smoking status: Never Smoker  . Smokeless tobacco: Never Used  . Alcohol use None  . Drug use: Unknown  . Sexual activity: Not Asked   Other Topics Concern  . None   Social History Narrative  . None   No Known Allergies No family history on file. No family history rheumatological diseases.  Past medical history, social, surgical and family history all reviewed in electronic medical record.  No pertanent information unless stated regarding to the chief complaint.   Review of Systems:Review of systems updated and as accurate as of 04/28/17  No headache, visual changes, nausea, vomiting, diarrhea, constipation, dizziness, abdominal pain, skin rash, fevers, chills, night sweats, weight loss, swollen lymph nodes, body aches, , muscle aches, chest pain, shortness of breath, mood changes. Positive joint swelling  Objective  Height 5\' 6"  (1.676 m). Systems examined below as of 04/28/17   General: No apparent distress alert and oriented x3 mood and affect normal, dressed appropriately.  HEENT: Pupils equal, extraocular  movements intact  Respiratory: Patient's speak in full sentences and does not appear short of breath  Cardiovascular: No lower extremity edema, non tender, no erythema  Skin: Warm dry intact with no signs of infection or rash on extremities or on axial skeleton.  Abdomen: Soft nontender  Neuro: Cranial nerves II through XII are intact, neurovascularly intact in all extremities with 2+ DTRs and 2+ pulses.  Lymph: No lymphadenopathy of posterior or anterior cervical chain or axillae bilaterally.  Gait antalgic gait MSK:  Non tender with full range of motion and good stability and symmetric strength and tone of shoulders, elbows, wrist, hip, and ankles bilaterally. Arthritic changes of multiple joints  Knee: Left Very large Baker cyst noted. Tender in the popliteal fossa Lacks last 15 of flexion Ligaments with solid consistent endpoints including ACL, PCL, LCL, MCL. Negative Mcmurray's, Apley's, and Thessalonian tests. Non painful patellar compression. Patellar glide moderate crepitus. Patellar and quadriceps tendons unremarkable. Hamstring and quadriceps strength is normal.  Contralateral knee unremarkable except for some mild arthritic changes.  Procedure: Real-time Ultrasound Guided Injection of left knee Device: GE Logiq Q7 Ultrasound guided injection is preferred based studies that show increased duration, increased effect, greater accuracy, decreased procedural pain, increased response rate, and decreased cost with ultrasound guided versus blind injection.  Verbal informed consent obtained.  Time-out conducted.  Noted no overlying erythema, induration, or other signs of local infection.  Skin prepped in a sterile fashion.  Local anesthesia: Topical Ethyl chloride.  With sterile technique and under real time ultrasound guidance:  With a 22-gauge 2 inch needle patient was injected with 4 cc of 0.5% Marcaine and aspirated greater than 60 mL of strawlike colored fluid with what appeared  to be a white more dense substance at the very end. 1 cc of Kenalog 40 mg/dL. This was from a posterior approach Completed without difficulty  Pain immediately resolved suggesting accurate placement of the medication.  Advised to call if fevers/chills, erythema, induration, drainage, or persistent bleeding.  Images permanently stored and available for review in the ultrasound unit.  Impression: Technically successful ultrasound guided injection.     Impression and Recommendations:     This case required medical decision making of moderate complexity.      Note: This dictation was prepared with Dragon dictation along with smaller phrase technology. Any transcriptional errors that result from this process are unintentional.

## 2017-04-29 LAB — SYNOVIAL CELL COUNT + DIFF, W/ CRYSTALS
Basophils, %: 0 %
Eosinophils-Synovial: 0 % (ref 0–2)
LYMPHOCYTES-SYNOVIAL FLD: 54 % (ref 0–74)
Monocyte/Macrophage: 0 % (ref 0–69)
Neutrophil, Synovial: 24 % (ref 0–24)
Synoviocytes, %: 22 % — ABNORMAL HIGH (ref 0–15)
WBC, Synovial: 171 cells/uL — ABNORMAL HIGH (ref <150)

## 2017-05-03 LAB — BODY FLUID CULTURE
Gram Stain: NONE SEEN
Organism ID, Bacteria: NO GROWTH

## 2017-05-04 LAB — URIC ACID, SYNOVIAL FLUID

## 2017-05-12 ENCOUNTER — Encounter: Payer: Self-pay | Admitting: Internal Medicine

## 2017-05-12 DIAGNOSIS — R2231 Localized swelling, mass and lump, right upper limb: Secondary | ICD-10-CM | POA: Diagnosis not present

## 2017-05-12 DIAGNOSIS — M79645 Pain in left finger(s): Secondary | ICD-10-CM | POA: Diagnosis not present

## 2017-05-12 DIAGNOSIS — M79644 Pain in right finger(s): Secondary | ICD-10-CM | POA: Diagnosis not present

## 2017-05-12 DIAGNOSIS — R2232 Localized swelling, mass and lump, left upper limb: Secondary | ICD-10-CM | POA: Diagnosis not present

## 2017-06-27 DIAGNOSIS — E785 Hyperlipidemia, unspecified: Secondary | ICD-10-CM | POA: Diagnosis not present

## 2017-06-27 DIAGNOSIS — E039 Hypothyroidism, unspecified: Secondary | ICD-10-CM | POA: Diagnosis not present

## 2017-06-29 ENCOUNTER — Ambulatory Visit (AMBULATORY_SURGERY_CENTER): Payer: Self-pay | Admitting: *Deleted

## 2017-06-29 VITALS — Ht 66.0 in | Wt 122.0 lb

## 2017-06-29 DIAGNOSIS — E7800 Pure hypercholesterolemia, unspecified: Secondary | ICD-10-CM | POA: Insufficient documentation

## 2017-06-29 DIAGNOSIS — I059 Rheumatic mitral valve disease, unspecified: Secondary | ICD-10-CM | POA: Insufficient documentation

## 2017-06-29 DIAGNOSIS — I10 Essential (primary) hypertension: Secondary | ICD-10-CM | POA: Insufficient documentation

## 2017-06-29 DIAGNOSIS — Z1211 Encounter for screening for malignant neoplasm of colon: Secondary | ICD-10-CM

## 2017-06-29 DIAGNOSIS — K219 Gastro-esophageal reflux disease without esophagitis: Secondary | ICD-10-CM | POA: Insufficient documentation

## 2017-06-29 NOTE — Progress Notes (Signed)
Patient denies any allergies to eggs or soy. Patient denies any problems with anesthesia/sedation. Patient denies any oxygen use at home and does not take any diet/weight loss medications. EMMI education assisgned to patient on colonoscopy, this was explained and instructions given to patient. 2 day prep given to patient because she states she has problems with constipation, states having 1 BM every 10 days since having diarrhea about 1 month ago from taking  Statin drug.

## 2017-07-04 ENCOUNTER — Encounter: Payer: Self-pay | Admitting: Internal Medicine

## 2017-07-13 ENCOUNTER — Other Ambulatory Visit (INDEPENDENT_AMBULATORY_CARE_PROVIDER_SITE_OTHER): Payer: Medicare Other

## 2017-07-13 ENCOUNTER — Ambulatory Visit (AMBULATORY_SURGERY_CENTER): Payer: Medicare Other | Admitting: Internal Medicine

## 2017-07-13 ENCOUNTER — Encounter: Payer: Self-pay | Admitting: Internal Medicine

## 2017-07-13 VITALS — BP 134/74 | HR 55 | Temp 98.0°F | Resp 14 | Ht 66.0 in | Wt 122.0 lb

## 2017-07-13 DIAGNOSIS — K6389 Other specified diseases of intestine: Secondary | ICD-10-CM | POA: Diagnosis not present

## 2017-07-13 DIAGNOSIS — K389 Disease of appendix, unspecified: Secondary | ICD-10-CM | POA: Diagnosis not present

## 2017-07-13 DIAGNOSIS — Z1211 Encounter for screening for malignant neoplasm of colon: Secondary | ICD-10-CM | POA: Diagnosis present

## 2017-07-13 DIAGNOSIS — K388 Other specified diseases of appendix: Secondary | ICD-10-CM

## 2017-07-13 LAB — BUN: BUN: 7 mg/dL (ref 6–23)

## 2017-07-13 LAB — CREATININE, SERUM: CREATININE: 0.73 mg/dL (ref 0.40–1.20)

## 2017-07-13 MED ORDER — SODIUM CHLORIDE 0.9 % IV SOLN: 500.0000 mL | INTRAVENOUS | Status: DC

## 2017-07-13 NOTE — Patient Instructions (Addendum)
There is an abnormality of the appendix area - not sure what it is - does not look like cancer - is in the wall of the appendix. I took biopsies but may not have gotten deep enough to get tissue of abnormality.  Will see what that shows but you also need a CT scan which my office will order.  You will go to the lab today for kidney function tests and my office RN will arrange the scan.  I am away next week but hope to check the computer for information and let you know.  I appreciate the opportunity to care for you. Gatha Mayer, MD, FACG YOU HAD AN ENDOSCOPIC PROCEDURE TODAY AT [facility]:   Refer to the procedure report that was given to you for any specific questions about what was found during the examination.  If the procedure report does not answer your questions, please call your gastroenterologist to clarify.  If you requested that your care partner not be given the details of your procedure findings, then the procedure report has been included in a sealed envelope for you to review at your convenience later.  YOU SHOULD EXPECT: Some feelings of bloating in the abdomen. Passage of more gas than usual.  Walking can help get rid of the air that was put into your GI tract during the procedure and reduce the bloating. If you had a lower endoscopy (such as a colonoscopy or flexible sigmoidoscopy) you may notice spotting of blood in your stool or on the toilet paper. If you underwent a bowel prep for your procedure, you may not have a normal bowel movement for a few days.  Please Note:  You might notice some irritation and congestion in your nose or some drainage.  This is from the oxygen used during your procedure.  There is no need for concern and it should clear up in a day or so.  SYMPTOMS TO REPORT IMMEDIATELY:   Following lower endoscopy (colonoscopy or flexible sigmoidoscopy):  Excessive amounts of blood in the stool  Significant tenderness or worsening of  abdominal pains  Swelling of the abdomen that is new, acute  Fever of 100F or higher  For urgent or emergent issues, a gastroenterologist can be reached at any hour by calling [phone].   DIET:  We do recommend a small meal at first, but then you may proceed to your regular diet.  Drink plenty of fluids but you should avoid alcoholic beverages for 24 hours.  ACTIVITY:  You should plan to take it easy for the rest of today and you should NOT DRIVE or use heavy machinery until tomorrow (because of the sedation medicines used during the test).    FOLLOW UP: Our staff will call the number listed on your records the next business day following your procedure to check on you and address any questions or concerns that you may have regarding the information given to you following your procedure. If we do not reach you, we will leave a message.  However, if you are feeling well and you are not experiencing any problems, there is no need to return our call.  We will assume that you have returned to your regular daily activities without incident.  If any biopsies were taken you will be contacted by phone or by letter within the next 1-3 weeks.  Please call us at [phone] if you have not heard about the biopsies in 3 weeks.   Await  for biopsy results Hemorrhoids (handout given)   SIGNATURES/CONFIDENTIALITY: You and/or your care partner have signed paperwork which will be entered into your electronic medical record.  These signatures attest to the fact that that the information above on your After Visit Summary has been reviewed and is understood.  Full responsibility of the confidentiality of this discharge information lies with you and/or your care-partner.

## 2017-07-13 NOTE — Progress Notes (Signed)
Pt's states no medical or surgical changes since previsit or office visit. 

## 2017-07-13 NOTE — Progress Notes (Signed)
Pt given contrast and going downstairs for lab and then being discharged home. Dr. Carlean Purl office will call with CT scan appointment and orders.

## 2017-07-13 NOTE — Progress Notes (Signed)
Report to PACU, RN, vss, BBS= Clear.  

## 2017-07-13 NOTE — Progress Notes (Signed)
Called to room to assist during endoscopic procedure.  Patient ID and intended procedure confirmed with present staff. Received instructions for my participation in the procedure from the performing physician.  

## 2017-07-13 NOTE — Op Note (Signed)
[place] Patient Name: Abigail [name] Procedure Date: 07/13/2017 10:38 AM MRN: [id number] Endoscopist: Gatha Mayer , MD Age: 76 Referring MD:  Date of Birth: June 20, 1941 Gender: Female Account #: [id number] Procedure:                Colonoscopy Indications:              Screening for colorectal malignant neoplasm, Last                            colonoscopy: 2006 Medicines:                Propofol per Anesthesia, Monitored Anesthesia Care Procedure:                Pre-Anesthesia Assessment:                           - Prior to the procedure, a History and Physical                            was performed, and patient medications and                            allergies were reviewed. The patient's tolerance of                            previous anesthesia was also reviewed. The risks                            and benefits of the procedure and the sedation                            options and risks were discussed with the patient.                            All questions were answered, and informed consent                            was obtained. Prior Anticoagulants: The patient has                            taken no previous anticoagulant or antiplatelet                            agents. ASA Grade Assessment: II - A patient with                            mild systemic disease. After reviewing the risks                            and benefits, the patient was deemed in                            satisfactory condition to undergo the procedure.  After obtaining informed consent, the colonoscope                            was passed under direct vision. Throughout the                            procedure, the patient's blood pressure, pulse, and                            oxygen saturations were monitored continuously. The                            Colonoscope was introduced through the anus and                            advanced to the the  cecum, identified by                            appendiceal orifice and ileocecal valve. The                            colonoscopy was somewhat difficult due to                            significant looping. Successful completion of the                            procedure was aided by applying abdominal pressure.                            The patient tolerated the procedure well. The                            quality of the bowel preparation was excellent. The                            ileocecal valve, appendiceal orifice, and rectum                            were photographed. The bowel preparation used was                            Miralax. Scope In: 10:47:20 AM Scope Out: 10:59:57 AM Scope Withdrawal Time: 0 hours 8 minutes 50 seconds  Total Procedure Duration: 0 hours 12 minutes 37 seconds  Findings:                 A submucosal medium-sized mass was found                            appendiceal orifice. Did not reduce with forceps                            pressure. somewhat firm. No bleeding was present.  This was tunnel biopsied with a cold forceps for                            histology. Verification of patient identification                            for the specimen was done. Estimated blood loss was                            minimal.                           The perianal exam findings include non-thrombosed                            external hemorrhoids and skin tags.                           The exam was otherwise without abnormality on                            direct and retroflexion views. Complications:            No immediate complications. Estimated Blood Loss:     Estimated blood loss was minimal. Impression:               - Submucosal mass at the appendiceal orifice.                            Biopsied.                           - Non-thrombosed external hemorrhoids and perianal                            skin tags found on  perianal exam.                           - The examination was otherwise normal on direct                            and retroflexion views. Recommendation:           - Patient has a contact number available for                            emergencies. The signs and symptoms of potential                            delayed complications were discussed with the                            patient. Return to normal activities tomorrow.                            Written discharge instructions were provided to the  patient.                           - Resume previous diet.                           - Continue present medications.                           - Await pathology results.                           - Perform a CT scan (computed tomography) of                            abdomen with contrast and pelvis with contrast at                            appointment to be scheduled. Dx Mass of appendix.                           - Check BUN and creatinine (ordered) today.                           - Repeat colonoscopy is recommended. The                            colonoscopy date will be determined after pathology                            results from today's exam become available for                            review. Gatha Mayer, MD 07/13/2017 11:15:26 AM This report has been signed electronically.

## 2017-07-14 ENCOUNTER — Telehealth: Payer: Self-pay

## 2017-07-14 ENCOUNTER — Other Ambulatory Visit: Payer: Self-pay

## 2017-07-14 DIAGNOSIS — E039 Hypothyroidism, unspecified: Secondary | ICD-10-CM | POA: Diagnosis not present

## 2017-07-14 DIAGNOSIS — I2510 Atherosclerotic heart disease of native coronary artery without angina pectoris: Secondary | ICD-10-CM | POA: Diagnosis not present

## 2017-07-14 DIAGNOSIS — R1907 Generalized intra-abdominal and pelvic swelling, mass and lump: Secondary | ICD-10-CM

## 2017-07-14 DIAGNOSIS — I10 Essential (primary) hypertension: Secondary | ICD-10-CM | POA: Diagnosis not present

## 2017-07-14 DIAGNOSIS — K388 Other specified diseases of appendix: Secondary | ICD-10-CM

## 2017-07-14 NOTE — Progress Notes (Signed)
Per colonoscopy report from 07/13/17 patient needs CT scan abd and pelvis.  Patient has been scheduled for 07/20/17 :30 at [facility].  The patient's husband is notified of the appt details and will have her call back if she has any questions when she returns

## 2017-07-14 NOTE — Telephone Encounter (Signed)
  Follow up Call-  Call back number 07/13/2017  Post procedure Call Back phone  # [phone]  Permission to leave phone message Yes  Some recent data might be hidden     Patient questions:  Do you have a fever, pain , or abdominal swelling? No. Pain Score  0 *  Have you tolerated food without any problems? Yes.    Have you been able to return to your normal activities? Yes.    Do you have any questions about your discharge instructions: Diet   No. Medications  No. Follow up visit  No.  Do you have questions or concerns about your Care? No.  Spoke with patient's husband to obtain information as patient is still asleep and he doesn't want to disturb her. Patient needs CT scan as part of f/u. Informed patient's husband that someone from Dr. Celesta Aver office will call them most likely today with CT appointment. Patient's husband coincidentally has an appointment with Dr. Carlean Purl today and I instructed him to ask about his wife's CT appointment if he hasn't heard anything. He verbalized understanding.  Actions: * If pain score is 4 or above: No action needed, pain <4.

## 2017-07-20 ENCOUNTER — Encounter: Payer: Self-pay | Admitting: Radiology

## 2017-07-20 ENCOUNTER — Ambulatory Visit (INDEPENDENT_AMBULATORY_CARE_PROVIDER_SITE_OTHER)
Admission: RE | Admit: 2017-07-20 | Discharge: 2017-07-20 | Disposition: A | Discharge status: Home / Self Care | Payer: Medicare Other | Source: Ambulatory Visit | Attending: Internal Medicine | Admitting: Internal Medicine

## 2017-07-20 DIAGNOSIS — N281 Cyst of kidney, acquired: Secondary | ICD-10-CM | POA: Diagnosis not present

## 2017-07-20 DIAGNOSIS — K388 Other specified diseases of appendix: Secondary | ICD-10-CM

## 2017-07-20 DIAGNOSIS — K389 Disease of appendix, unspecified: Secondary | ICD-10-CM

## 2017-07-20 MED ORDER — IOPAMIDOL (ISOVUE-300) INJECTION 61%
100.0000 mL | Freq: Once | INTRAVENOUS | Status: AC | PRN
  Administered 2017-07-20: 100 mL via INTRAVENOUS

## 2017-07-22 NOTE — Progress Notes (Signed)
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

## 2017-07-22 NOTE — Progress Notes (Signed)
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 Armandina Gemma (husband has seen him and they request)  My Chart message + I have left her a phone message also  Dear Ms. Platz,  The biopsies did not show an abnormality but the CT does. 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 This should be removed as we discussed and we will get you an appointment with Dr. Harlow Asa.  Let me know if any questions arise.  Warm regards,  Gatha Mayer, MD, [facility]

## 2017-07-23 NOTE — Progress Notes (Signed)
Spoke to her re: results and surgical referral

## 2017-07-31 DIAGNOSIS — L718 Other rosacea: Secondary | ICD-10-CM | POA: Diagnosis not present

## 2017-08-04 DIAGNOSIS — Z23 Encounter for immunization: Secondary | ICD-10-CM | POA: Diagnosis not present

## 2017-08-09 ENCOUNTER — Ambulatory Visit: Payer: Self-pay | Admitting: Surgery

## 2017-08-09 DIAGNOSIS — D373 Neoplasm of uncertain behavior of appendix: Secondary | ICD-10-CM | POA: Diagnosis not present

## 2017-08-11 NOTE — Patient Instructions (Addendum)
Abigail [name]  08/11/2017   Your procedure is scheduled on: Thursday 08-17-17  Report to [facility] Main  Entrance Take [place]  elevators to 3rd floor to  [place] at 830 AM.  Call this number if you have problems the morning of surgery [phone]   Remember: ONLY 1 PERSON MAY GO WITH YOU TO SHORT STAY TO GET  READY MORNING OF [place].  Do not eat food :After Midnight Tuesday NIGHT, CLEAR LIQUIDS ALL DAY Wednesday 08-16-17, NO CLEAR CLEAR LIQUIDS AFTER MIDNIGHT WEDNESDAY NIGHT.      CLEAR LIQUID DIET   Foods Allowed                                                                     Foods Excluded  Coffee and tea, regular and decaf                             liquids that you cannot  Plain Jell-O in any flavor                                             see through such as: Fruit ices (not with fruit pulp)                                     milk, soups, orange juice  Iced Popsicles                                    All solid food Carbonated beverages, regular and diet                                    Cranberry, grape and apple juices Sports drinks like Gatorade Lightly seasoned clear broth or consume(fat free) Sugar, honey syrup  Sample Menu Breakfast                                Lunch                                     Supper Cranberry juice                    Beef broth                            Chicken broth Jell-O                                     Grape juice  Apple juice Coffee or tea                        Jell-O                                      Popsicle                                                Coffee or tea                        Coffee or tea  _____________________________________________________________________     CLEAR LIQUID DIET   Foods Allowed                                                                     Foods Excluded  Coffee and tea, regular and decaf                              liquids that you cannot  Plain Jell-O in any flavor                                             see through such as: Fruit ices (not with fruit pulp)                                     milk, soups, orange juice  Iced Popsicles                                    All solid food Carbonated beverages, regular and diet                                    Cranberry, grape and apple juices Sports drinks like Gatorade Lightly seasoned clear broth or consume(fat free) Sugar, honey syrup  Sample Menu Breakfast                                Lunch                                     Supper Cranberry juice                    Beef broth                            Chicken broth Jell-O  Grape juice                           Apple juice Coffee or tea                        Jell-O                                      Popsicle                                                Coffee or tea                        Coffee or tea  _____________________________________________________________________     Take these medicines the morning of surgery with A SIP OF WATER: GABAPENTIN (NEURONTIN) IF NEEDED, LORAZAPAM (ATIVAN )IF NEEDED, ES TYLENOL IF NEEDED,  FOLLOW ALL DRINK INSTRUCTIONS PER DR GERKIN INSTRUCTIONS. FOLLOW ALL BOWEL PREP  INSTRUCTIONS PER DR Harlow Asa               You may not have any metal on your body including hair pins and              piercings  Do not wear jewelry, make-up, lotions, powders or perfumes, deodorant             Do not wear nail polish.  Do not shave  48 hours prior to surgery.              Men may shave face and neck.   Do not bring valuables to the hospital. [place].  Contacts, dentures or bridgework may not be worn into surgery.  Leave suitcase in the car. After surgery it may be brought to your room.                  Please read over the following fact sheets you were  given: _____________________________________________________________________             [facility] - Preparing for Surgery Before surgery, you can play an important role.  Because skin is not sterile, your skin needs to be as free of germs as possible.  You can reduce the number of germs on your skin by washing with CHG (chlorahexidine gluconate) soap before surgery.  CHG is an antiseptic cleaner which kills germs and bonds with the skin to continue killing germs even after washing. Please DO NOT use if you have an allergy to CHG or antibacterial soaps.  If your skin becomes reddened/irritated stop using the CHG and inform your nurse when you arrive at Short Stay. Do not shave (including legs and underarms) for at least 48 hours prior to the first CHG shower.  You may shave your face/neck. Please follow these instructions carefully:  1.  Shower with CHG Soap the night before surgery and the  morning of Surgery.  2.  If you choose to wash your hair, wash your hair first as usual with your  normal  shampoo.  3.  After you shampoo, rinse your hair and body thoroughly  to remove the  shampoo.                           4.  Use CHG as you would any other liquid soap.  You can apply chg directly  to the skin and wash                       Gently with a scrungie or clean washcloth.  5.  Apply the CHG Soap to your body ONLY FROM THE NECK DOWN.   Do not use on face/ open                           Wound or open sores. Avoid contact with eyes, ears mouth and genitals (private parts).                       Wash face,  Genitals (private parts) with your normal soap.             6.  Wash thoroughly, paying special attention to the area where your surgery  will be performed.  7.  Thoroughly rinse your body with warm water from the neck down.  8.  DO NOT shower/wash with your normal soap after using and rinsing off  the CHG Soap.                9.  Pat yourself dry with a clean towel.            10.  Wear  clean pajamas.            11.  Place clean sheets on your bed the night of your first shower and do not  sleep with pets. Day of Surgery : Do not apply any lotions/deodorants the morning of surgery.  Please wear clean clothes to the hospital/surgery center.  FAILURE TO FOLLOW THESE INSTRUCTIONS MAY RESULT IN THE CANCELLATION OF YOUR SURGERY PATIENT SIGNATURE_________________________________  NURSE SIGNATURE__________________________________  ________________________________________________________________________

## 2017-08-14 ENCOUNTER — Encounter (HOSPITAL_COMMUNITY): Payer: Self-pay

## 2017-08-14 ENCOUNTER — Encounter (HOSPITAL_COMMUNITY)
Admission: RE | Admit: 2017-08-14 | Discharge: 2017-08-14 | Disposition: A | Discharge status: Home / Self Care | Payer: Medicare Other | Source: Ambulatory Visit | Attending: Surgery | Admitting: Surgery

## 2017-08-14 DIAGNOSIS — I10 Essential (primary) hypertension: Secondary | ICD-10-CM | POA: Diagnosis not present

## 2017-08-14 DIAGNOSIS — K5909 Other constipation: Secondary | ICD-10-CM | POA: Diagnosis not present

## 2017-08-14 DIAGNOSIS — K219 Gastro-esophageal reflux disease without esophagitis: Secondary | ICD-10-CM | POA: Diagnosis not present

## 2017-08-14 DIAGNOSIS — D373 Neoplasm of uncertain behavior of appendix: Secondary | ICD-10-CM | POA: Diagnosis not present

## 2017-08-14 DIAGNOSIS — Z87891 Personal history of nicotine dependence: Secondary | ICD-10-CM | POA: Diagnosis not present

## 2017-08-14 DIAGNOSIS — Z79899 Other long term (current) drug therapy: Secondary | ICD-10-CM | POA: Diagnosis not present

## 2017-08-14 HISTORY — DX: Rosacea, unspecified: L71.9

## 2017-08-14 HISTORY — DX: Synovial cyst of popliteal space (Baker), unspecified knee: M71.20

## 2017-08-14 HISTORY — DX: Unspecified osteoarthritis, unspecified site: M19.90

## 2017-08-14 HISTORY — DX: Neoplasm of unspecified behavior of unspecified site: D49.9

## 2017-08-14 LAB — BASIC METABOLIC PANEL
ANION GAP: 8 (ref 5–15)
BUN: 14 mg/dL (ref 6–20)
CHLORIDE: 100 mmol/L — AB (ref 101–111)
CO2: 27 mmol/L (ref 22–32)
Calcium: 9 mg/dL (ref 8.9–10.3)
Creatinine, Ser: 0.81 mg/dL (ref 0.44–1.00)
GFR calc Af Amer: >60 mL/min (ref >60)
Glucose, Bld: 98 mg/dL (ref 65–99)
POTASSIUM: 4 mmol/L (ref 3.5–5.1)
SODIUM: 135 mmol/L (ref 135–145)

## 2017-08-14 LAB — CBC
HEMATOCRIT: 44 % (ref 36.0–46.0)
HEMOGLOBIN: 14.7 g/dL (ref 12.0–15.0)
MCH: 30.7 pg (ref 26.0–34.0)
MCHC: 33.4 g/dL (ref 30.0–36.0)
MCV: 91.9 fL (ref 78.0–100.0)
Platelets: 245 10*3/uL (ref 150–400)
RBC: 4.79 MIL/uL (ref 3.87–5.11)
RDW: 12.7 % (ref 11.5–15.5)
WBC: 5.5 10*3/uL (ref 4.0–10.5)

## 2017-08-15 LAB — CEA: CEA: 3.1 ng/mL (ref 0.0–4.7)

## 2017-08-16 ENCOUNTER — Encounter (HOSPITAL_COMMUNITY): Payer: Self-pay | Admitting: Surgery

## 2017-08-16 DIAGNOSIS — D373 Neoplasm of uncertain behavior of appendix: Secondary | ICD-10-CM | POA: Diagnosis present

## 2017-08-16 MED ORDER — SODIUM CHLORIDE 0.9 % IV SOLN
1.0000 g | INTRAVENOUS | Status: DC
  Filled 2017-08-16: qty 1

## 2017-08-16 NOTE — H&P (Signed)
General Surgery [facility]  Abigail [name] 08/09/2017 9:45 AM Location: [facility] Patient #: [id number] DOB: 1940/11/02 Married / Language: Abigail [name] / Race: White Female   History of Present Illness Abigail Regal MD; 08/09/2017 10:13 AM) The patient is a 76 year old female who presents with a colonic mass.  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 Patient's primary care physician is Dr. Jani [name]. Patient had undergone a routine screening colonoscopy on July 13, 2017. I have reviewed the photographs from this study. There was an obvious extrinsic mass at the base of the appendix. Subsequent CT scan of the abdomen and pelvis on July 20, 2017 shows a 2.6 cm cystic mass with mural calcifications situated at the base of the appendix adjacent to the cecum. This is suspicious for mucocele. However a appendiceal neoplasm cannot be excluded. Patient is now referred for consideration for surgical resection for definitive diagnosis. Patient has had no colonic symptoms. She does have chronic constipation. She has no history of prior abdominal surgery. Her last colonoscopy was in 2006.   Past Surgical History Abigail Lorenzo, LPN; 62/37/6283 1:51 AM) Breast Augmentation  Bilateral. Cataract Surgery  Bilateral. Colon Polyp Removal - Colonoscopy  Oral Surgery   Diagnostic Studies History Abigail Lorenzo, LPN; 76/16/0737 1:06 AM) Colonoscopy  within last year Mammogram  within last year  Allergies Abigail Lorenzo, LPN; 26/94/8546 2:70 AM) Statins  Nausea, Diarrhea, Vomiting. Allergies Reconciled   Medication History Abigail Lorenzo, LPN; 35/00/9381 8:29 AM) Gabapentin (100MG  Capsule, Oral as needed) Active. Losartan Potassium (25MG  Tablet, Oral) Active. Excedrin (250-250-65MG  Tablet, Oral as needed) Active. Medications  Reconciled  Social History Abigail Lorenzo, LPN; 93/71/6967 8:93 AM) Alcohol use  Moderate alcohol use. Caffeine use  Tea. No drug use  Tobacco use  Former smoker.  Family History Abigail Lorenzo, LPN; 81/10/7508 2:58 AM) Cancer  Father, Son. Hypertension  Mother.  Pregnancy / Birth History Abigail Lorenzo, LPN; 52/77/8242 3:53 AM) Age at menarche  25 years. Age of menopause  32-55 Gravida  3 Maternal age  78-25 Para  68  Other Problems Abigail Lorenzo, LPN; 61/44/3154 0:08 AM) Arthritis  Back Pain  Hemorrhoids  High blood pressure     Review of Systems Abigail Billings Dockery LPN; 67/61/9509 3:26 AM) General Not Present- Appetite Loss, Chills, Fatigue, Fever, Night Sweats, Weight Gain and Weight Loss. Skin Not Present- Change in Wart/Mole, Dryness, Hives, Jaundice, New Lesions, Non-Healing Wounds, Rash and Ulcer. HEENT Present- Seasonal Allergies and Sinus Pain. Not Present- Earache, Hearing Loss, Hoarseness, Nose Bleed, Oral Ulcers, Ringing in the Ears, Sore Throat, Visual Disturbances, Wears glasses/contact lenses and Yellow Eyes. Respiratory Not Present- Bloody sputum, Chronic Cough, Difficulty Breathing, Snoring and Wheezing. Breast Not Present- Breast Mass, Breast Pain, Nipple Discharge and Skin Changes. Cardiovascular Not Present- Chest Pain, Difficulty Breathing Lying Down, Leg Cramps, Palpitations, Rapid Heart Rate, Shortness of Breath and Swelling of Extremities. Gastrointestinal Present- Constipation and Hemorrhoids. Not Present- Abdominal Pain, Bloating, Bloody Stool, Change in Bowel Habits, Chronic diarrhea, Difficulty Swallowing, Excessive gas, Gets full quickly at meals, Indigestion, Nausea, Rectal Pain and Vomiting. Female Genitourinary Not Present- Frequency, Nocturia, Painful Urination, Pelvic Pain and Urgency. Musculoskeletal Present- Back Pain, Joint Pain and Joint Stiffness. Not Present- Muscle Pain, Muscle Weakness and Swelling of Extremities. Neurological  Not Present- Decreased Memory, Fainting, Headaches, Numbness, Seizures, Tingling, Tremor, Trouble walking and Weakness. Psychiatric Not Present-  Anxiety, Bipolar, Change in Sleep Pattern, Depression, Fearful and Frequent crying. Endocrine Present- Cold Intolerance. Not Present- Excessive Hunger, Hair Changes, Heat Intolerance, Hot flashes and New Diabetes. Hematology Present- Easy Bruising. Not Present- Blood Thinners, Excessive bleeding, Gland problems, HIV and Persistent Infections.  Vitals Abigail Billings Dockery LPN; 65/99/3570 1:77 AM) 08/09/2017 9:45 AM Weight: 121.6 lb Height: 65.5in Body Surface Area: 1.61 m Body Mass Index: 19.93 kg/m  Temp.: 98.52F(Temporal)  Pulse: 72 (Regular)  BP: 136/84 (Sitting, Left Arm, Standard)       Physical Exam Abigail Regal MD; 08/09/2017 10:14 AM) The physical exam findings are as follows: Note:CONSTITUTIONAL See vital signs recorded above  GENERAL APPEARANCE Development: normal Nutritional status: normal Gross deformities: none  SKIN Rash, lesions, ulcers: none Induration, erythema: none Nodules: none palpable  EYES Conjunctiva and lids: normal Pupils: equal and reactive Iris: normal bilaterally  EARS, NOSE, MOUTH, THROAT External ears: no lesion or deformity External nose: no lesion or deformity Hearing: grossly normal Lips: no lesion or deformity Dentition: normal for age Oral mucosa: moist  NECK Symmetric: yes Trachea: midline Thyroid: no palpable nodules in the thyroid bed  CHEST Respiratory effort: normal Retraction or accessory muscle use: no Breath sounds: normal bilaterally Rales, rhonchi, wheeze: none  CARDIOVASCULAR Auscultation: regular rhythm, normal rate Murmurs: none Pulses: carotid and radial pulse 2+ palpable Lower extremity edema: none Lower extremity varicosities: none  ABDOMEN Distension: none Masses: none palpable Tenderness: none Hepatosplenomegaly: not present Hernia: not  present  MUSCULOSKELETAL Station and gait: normal Digits and nails: no clubbing or cyanosis Muscle strength: grossly normal all extremities Range of motion: grossly normal all extremities Deformity: none  LYMPHATIC Cervical: none palpable Supraclavicular: none palpable  PSYCHIATRIC Oriented to person, place, and time: yes Mood and affect: normal for situation Judgment and insight: appropriate for situation    Assessment & Plan Abigail Regal MD; 08/09/2017 10:16 AM) 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 Patient is referred by her gastroenterologist for evaluation of newly diagnosed cystic neoplasm of the appendix. Patient is provided with written literature on colon surgery to review at home. She is accompanied by her husband.  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 It measures approximately 2.6 cm in size. She has had no prior abdominal surgery.  I have recommended performing a limited resection of the terminal ileum and cecum through an open transverse abdominal incision. We discussed the risk and benefits of this procedure. We discussed the hospital stay to be anticipated. We discussed the potential need for additional evaluation and treatment, possibly at [facility]. Patient understands and wishes to proceed with surgery in the near future.  The risks and benefits of the procedure have been discussed at length with the patient. The patient understands the proposed procedure, potential alternative treatments, and the course of recovery to be expected. All of the patient's questions have been answered at this time. The patient wishes to proceed with surgery.  Armandina Gemma, [facility] Office: [phone]

## 2017-08-17 ENCOUNTER — Inpatient Hospital Stay (HOSPITAL_COMMUNITY): Payer: Medicare Other | Admitting: Certified Registered Nurse Anesthetist

## 2017-08-17 ENCOUNTER — Encounter (HOSPITAL_COMMUNITY)
Admission: RE | Disposition: A | Discharge status: Home / Self Care | Payer: Self-pay | Source: Ambulatory Visit | Attending: Surgery

## 2017-08-17 ENCOUNTER — Inpatient Hospital Stay (HOSPITAL_COMMUNITY)
Admission: RE | Admit: 2017-08-17 | Discharge: 2017-08-20 | DRG: 331 | Disposition: A | Discharge status: Home / Self Care | Payer: Medicare Other | Source: Ambulatory Visit | Attending: Surgery | Admitting: Surgery

## 2017-08-17 ENCOUNTER — Encounter (HOSPITAL_COMMUNITY): Payer: Self-pay | Admitting: *Deleted

## 2017-08-17 DIAGNOSIS — K219 Gastro-esophageal reflux disease without esophagitis: Secondary | ICD-10-CM | POA: Diagnosis not present

## 2017-08-17 DIAGNOSIS — Z87891 Personal history of nicotine dependence: Secondary | ICD-10-CM | POA: Diagnosis not present

## 2017-08-17 DIAGNOSIS — D121 Benign neoplasm of appendix: Secondary | ICD-10-CM | POA: Diagnosis not present

## 2017-08-17 DIAGNOSIS — K5909 Other constipation: Secondary | ICD-10-CM | POA: Diagnosis not present

## 2017-08-17 DIAGNOSIS — E7800 Pure hypercholesterolemia, unspecified: Secondary | ICD-10-CM | POA: Diagnosis not present

## 2017-08-17 DIAGNOSIS — D373 Neoplasm of uncertain behavior of appendix: Principal | ICD-10-CM | POA: Diagnosis present

## 2017-08-17 DIAGNOSIS — Z79899 Other long term (current) drug therapy: Secondary | ICD-10-CM

## 2017-08-17 DIAGNOSIS — K388 Other specified diseases of appendix: Secondary | ICD-10-CM | POA: Diagnosis not present

## 2017-08-17 DIAGNOSIS — I10 Essential (primary) hypertension: Secondary | ICD-10-CM | POA: Diagnosis present

## 2017-08-17 HISTORY — PX: ILEOCECETOMY: SHX5857

## 2017-08-17 LAB — CBC
HCT: 40.7 % (ref 36.0–46.0)
HEMOGLOBIN: 14.1 g/dL (ref 12.0–15.0)
MCH: 31 pg (ref 26.0–34.0)
MCHC: 34.6 g/dL (ref 30.0–36.0)
MCV: 89.5 fL (ref 78.0–100.0)
PLATELETS: 218 10*3/uL (ref 150–400)
RBC: 4.55 MIL/uL (ref 3.87–5.11)
RDW: 12.5 % (ref 11.5–15.5)
WBC: 12.6 10*3/uL — ABNORMAL HIGH (ref 4.0–10.5)

## 2017-08-17 LAB — CREATININE, SERUM
CREATININE: 0.7 mg/dL (ref 0.44–1.00)
GFR calc Af Amer: >60 mL/min (ref >60)

## 2017-08-17 SURGERY — EXCISION, CECUM WITH ILEUM
Anesthesia: General | Site: Abdomen

## 2017-08-17 MED ORDER — KCL IN DEXTROSE-NACL 20-5-0.45 MEQ/L-%-% IV SOLN
INTRAVENOUS | Status: DC
  Administered 2017-08-17 – 2017-08-18 (×3): via INTRAVENOUS
  Filled 2017-08-17 (×5): qty 1000

## 2017-08-17 MED ORDER — ERTAPENEM SODIUM 1 G IJ SOLR
1.0000 g | Freq: Once | INTRAMUSCULAR | Status: AC
  Administered 2017-08-17: 1 g via INTRAVENOUS
  Filled 2017-08-17: qty 1

## 2017-08-17 MED ORDER — ROCURONIUM BROMIDE 50 MG/5ML IV SOSY
PREFILLED_SYRINGE | INTRAVENOUS | Status: DC | PRN
  Administered 2017-08-17: 50 mg via INTRAVENOUS
  Administered 2017-08-17: 10 mg via INTRAVENOUS

## 2017-08-17 MED ORDER — OXYCODONE HCL 5 MG/5ML PO SOLN
5.0000 mg | Freq: Once | ORAL | Status: DC | PRN
Start: 2017-08-17 — End: 2017-08-17

## 2017-08-17 MED ORDER — ROCURONIUM BROMIDE 50 MG/5ML IV SOSY
PREFILLED_SYRINGE | INTRAVENOUS | Status: AC
Start: 2017-08-17 — End: 2017-08-17
  Filled 2017-08-17: qty 5

## 2017-08-17 MED ORDER — HYDROMORPHONE HCL 1 MG/ML IJ SOLN
0.2500 mg | INTRAMUSCULAR | Status: DC | PRN
  Administered 2017-08-17: 0.25 mg via INTRAVENOUS
  Administered 2017-08-17 (×2): 0.5 mg via INTRAVENOUS
  Administered 2017-08-17: 0.25 mg via INTRAVENOUS
  Administered 2017-08-17: 0.5 mg via INTRAVENOUS

## 2017-08-17 MED ORDER — GABAPENTIN 100 MG PO CAPS: 200.0000 mg | ORAL_CAPSULE | Freq: Every day | ORAL | Status: DC | PRN

## 2017-08-17 MED ORDER — HYDROMORPHONE HCL 1 MG/ML IJ SOLN
1.0000 mg | INTRAMUSCULAR | Status: DC | PRN
  Administered 2017-08-17: 1 mg via INTRAVENOUS
  Filled 2017-08-17: qty 1

## 2017-08-17 MED ORDER — CHLORHEXIDINE GLUCONATE CLOTH 2 % EX PADS: 6.0000 | MEDICATED_PAD | Freq: Once | CUTANEOUS | Status: DC

## 2017-08-17 MED ORDER — HYDROMORPHONE HCL 1 MG/ML IJ SOLN
INTRAMUSCULAR | Status: AC
  Administered 2017-08-17: 0.5 mg via INTRAVENOUS
  Filled 2017-08-17: qty 2

## 2017-08-17 MED ORDER — GABAPENTIN 300 MG PO CAPS
300.0000 mg | ORAL_CAPSULE | Freq: Two times a day (BID) | ORAL | Status: DC
  Administered 2017-08-17 – 2017-08-18 (×2): 300 mg via ORAL
  Filled 2017-08-17 (×2): qty 1

## 2017-08-17 MED ORDER — MEPERIDINE HCL 50 MG/ML IJ SOLN: 6.2500 mg | INTRAMUSCULAR | Status: DC | PRN

## 2017-08-17 MED ORDER — 0.9 % SODIUM CHLORIDE (POUR BTL) OPTIME
TOPICAL | Status: DC | PRN
  Administered 2017-08-17: 2000 mL

## 2017-08-17 MED ORDER — ALVIMOPAN 12 MG PO CAPS
12.0000 mg | ORAL_CAPSULE | Freq: Two times a day (BID) | ORAL | Status: DC
  Administered 2017-08-18 (×2): 12 mg via ORAL
  Filled 2017-08-17: qty 1

## 2017-08-17 MED ORDER — BUPIVACAINE LIPOSOME 1.3 % IJ SUSP
20.0000 mL | Freq: Once | INTRAMUSCULAR | Status: AC
  Administered 2017-08-17: 20 mL
  Filled 2017-08-17: qty 20

## 2017-08-17 MED ORDER — LIDOCAINE 2% (20 MG/ML) 5 ML SYRINGE
INTRAMUSCULAR | Status: AC
  Filled 2017-08-17: qty 5

## 2017-08-17 MED ORDER — LOSARTAN POTASSIUM 25 MG PO TABS
25.0000 mg | ORAL_TABLET | Freq: Every day | ORAL | Status: DC
  Administered 2017-08-17 – 2017-08-19 (×3): 25 mg via ORAL
  Filled 2017-08-17 (×3): qty 1

## 2017-08-17 MED ORDER — PROPOFOL 10 MG/ML IV BOLUS
INTRAVENOUS | Status: AC
  Filled 2017-08-17: qty 20

## 2017-08-17 MED ORDER — ACETAMINOPHEN 10 MG/ML IV SOLN
INTRAVENOUS | Status: DC | PRN
  Administered 2017-08-17: 1000 mg via INTRAVENOUS

## 2017-08-17 MED ORDER — SUGAMMADEX SODIUM 200 MG/2ML IV SOLN
INTRAVENOUS | Status: AC
  Filled 2017-08-17: qty 2

## 2017-08-17 MED ORDER — LACTATED RINGERS IV SOLN
INTRAVENOUS | Status: DC
  Administered 2017-08-17 (×2): via INTRAVENOUS

## 2017-08-17 MED ORDER — SUGAMMADEX SODIUM 200 MG/2ML IV SOLN
INTRAVENOUS | Status: DC | PRN
  Administered 2017-08-17: 150 mg via INTRAVENOUS

## 2017-08-17 MED ORDER — PHENYLEPHRINE HCL 10 MG/ML IJ SOLN
INTRAMUSCULAR | Status: DC | PRN
  Administered 2017-08-17: 40 ug via INTRAVENOUS

## 2017-08-17 MED ORDER — KETAMINE HCL-SODIUM CHLORIDE 100-0.9 MG/10ML-% IV SOSY
PREFILLED_SYRINGE | INTRAVENOUS | Status: AC
  Filled 2017-08-17: qty 10

## 2017-08-17 MED ORDER — ROCURONIUM BROMIDE 50 MG/5ML IV SOSY
PREFILLED_SYRINGE | INTRAVENOUS | Status: AC
  Filled 2017-08-17: qty 5

## 2017-08-17 MED ORDER — ACETAMINOPHEN 325 MG PO TABS
650.0000 mg | ORAL_TABLET | Freq: Four times a day (QID) | ORAL | Status: DC | PRN
  Administered 2017-08-17 – 2017-08-20 (×5): 650 mg via ORAL
  Filled 2017-08-17 (×5): qty 2

## 2017-08-17 MED ORDER — GABAPENTIN 300 MG PO CAPS
ORAL_CAPSULE | ORAL | Status: AC
  Filled 2017-08-17: qty 1

## 2017-08-17 MED ORDER — ALVIMOPAN 12 MG PO CAPS
12.0000 mg | ORAL_CAPSULE | ORAL | Status: AC
  Administered 2017-08-17: 12 mg via ORAL
  Filled 2017-08-17: qty 1

## 2017-08-17 MED ORDER — ENOXAPARIN SODIUM 40 MG/0.4ML ~~LOC~~ SOLN
40.0000 mg | SUBCUTANEOUS | Status: DC
  Administered 2017-08-18 – 2017-08-20 (×3): 40 mg via SUBCUTANEOUS
  Filled 2017-08-17 (×3): qty 0.4

## 2017-08-17 MED ORDER — LIDOCAINE 2% (20 MG/ML) 5 ML SYRINGE
INTRAMUSCULAR | Status: DC | PRN
  Administered 2017-08-17: 1 mg/kg/h via INTRAVENOUS

## 2017-08-17 MED ORDER — ACETAMINOPHEN 10 MG/ML IV SOLN
INTRAVENOUS | Status: AC
  Filled 2017-08-17: qty 100

## 2017-08-17 MED ORDER — KETAMINE HCL 10 MG/ML IJ SOLN
INTRAMUSCULAR | Status: DC | PRN
  Administered 2017-08-17: 25 mg via INTRAVENOUS

## 2017-08-17 MED ORDER — ONDANSETRON 4 MG PO TBDP: 4.0000 mg | ORAL_TABLET | Freq: Four times a day (QID) | ORAL | Status: DC | PRN

## 2017-08-17 MED ORDER — PROMETHAZINE HCL 25 MG/ML IJ SOLN: 6.2500 mg | INTRAMUSCULAR | Status: DC | PRN

## 2017-08-17 MED ORDER — PROPOFOL 10 MG/ML IV BOLUS
INTRAVENOUS | Status: DC | PRN
  Administered 2017-08-17: 90 mg via INTRAVENOUS
  Administered 2017-08-17: 20 mg via INTRAVENOUS

## 2017-08-17 MED ORDER — LORAZEPAM 0.5 MG PO TABS
0.5000 mg | ORAL_TABLET | Freq: Two times a day (BID) | ORAL | Status: DC | PRN
  Administered 2017-08-20: 0.5 mg via ORAL
  Filled 2017-08-17: qty 1

## 2017-08-17 MED ORDER — ONDANSETRON HCL 4 MG/2ML IJ SOLN
INTRAMUSCULAR | Status: DC | PRN
  Administered 2017-08-17: 4 mg via INTRAVENOUS

## 2017-08-17 MED ORDER — ONDANSETRON HCL 4 MG/2ML IJ SOLN: 4.0000 mg | Freq: Four times a day (QID) | INTRAMUSCULAR | Status: DC | PRN

## 2017-08-17 MED ORDER — DEXAMETHASONE SODIUM PHOSPHATE 10 MG/ML IJ SOLN
INTRAMUSCULAR | Status: AC
  Filled 2017-08-17: qty 1

## 2017-08-17 MED ORDER — ACETAMINOPHEN 650 MG RE SUPP: 650.0000 mg | Freq: Four times a day (QID) | RECTAL | Status: DC | PRN

## 2017-08-17 MED ORDER — PROMETHAZINE HCL 25 MG/ML IJ SOLN
INTRAMUSCULAR | Status: AC
  Filled 2017-08-17: qty 1

## 2017-08-17 MED ORDER — OXYCODONE HCL 5 MG PO TABS
5.0000 mg | ORAL_TABLET | Freq: Once | ORAL | Status: DC | PRN
Start: 2017-08-17 — End: 2017-08-17

## 2017-08-17 MED ORDER — FENTANYL CITRATE (PF) 250 MCG/5ML IJ SOLN
INTRAMUSCULAR | Status: AC
  Filled 2017-08-17: qty 5

## 2017-08-17 MED ORDER — DEXAMETHASONE SODIUM PHOSPHATE 10 MG/ML IJ SOLN
INTRAMUSCULAR | Status: DC | PRN
  Administered 2017-08-17: 5 mg via INTRAVENOUS

## 2017-08-17 MED ORDER — FENTANYL CITRATE (PF) 100 MCG/2ML IJ SOLN
INTRAMUSCULAR | Status: DC | PRN
  Administered 2017-08-17 (×3): 50 ug via INTRAVENOUS

## 2017-08-17 MED ORDER — ONDANSETRON HCL 4 MG/2ML IJ SOLN
INTRAMUSCULAR | Status: AC
  Filled 2017-08-17: qty 2

## 2017-08-17 SURGICAL SUPPLY — 39 items
BLADE HEX COATED 2.75 (ELECTRODE) ×3 IMPLANT
CLIP VESOCCLUDE LG 6/CT (CLIP) IMPLANT
COVER SURGICAL LIGHT HANDLE (MISCELLANEOUS) ×5 IMPLANT
DRSG OPSITE POSTOP 4X6 (GAUZE/BANDAGES/DRESSINGS) ×2 IMPLANT
ELECT REM PT RETURN 15FT ADLT (MISCELLANEOUS) ×3 IMPLANT
EVACUATOR DRAINAGE 10X20 100CC (DRAIN) IMPLANT
EVACUATOR SILICONE 100CC (DRAIN)
GAUZE SPONGE 4X4 12PLY STRL (GAUZE/BANDAGES/DRESSINGS) ×3 IMPLANT
GLOVE BIOGEL PI IND STRL 7.0 (GLOVE) ×1 IMPLANT
GLOVE BIOGEL PI INDICATOR 7.0 (GLOVE) ×2
GLOVE SURG ORTHO 8.0 STRL STRW (GLOVE) ×6 IMPLANT
GOWN STRL REUS W/TWL LRG LVL3 (GOWN DISPOSABLE) ×3 IMPLANT
GOWN STRL REUS W/TWL XL LVL3 (GOWN DISPOSABLE) ×14 IMPLANT
LEGGING LITHOTOMY PAIR STRL (DRAPES) IMPLANT
NS IRRIG 1000ML POUR BTL (IV SOLUTION) ×6 IMPLANT
PACK COLON (CUSTOM PROCEDURE TRAY) ×3 IMPLANT
RELOAD PROXIMATE 75MM BLUE (ENDOMECHANICALS) ×9 IMPLANT
RELOAD STAPLE 75 3.8 BLU REG (ENDOMECHANICALS) IMPLANT
SEALER TISSUE X1 CVD JAW (INSTRUMENTS) ×2 IMPLANT
SHEARS HARMONIC ACE PLUS 36CM (ENDOMECHANICALS) IMPLANT
STAPLER GUN LINEAR PROX 60 (STAPLE) ×2 IMPLANT
STAPLER PROXIMATE 75MM BLUE (STAPLE) ×2 IMPLANT
STAPLER VISISTAT 35W (STAPLE) ×3 IMPLANT
SUT ETHILON 3 0 PS 1 (SUTURE) IMPLANT
SUT MNCRL AB 3-0 PS2 18 (SUTURE) ×2 IMPLANT
SUT NOV 1 T60/GS (SUTURE) IMPLANT
SUT NOVA NAB DX-16 0-1 5-0 T12 (SUTURE) IMPLANT
SUT NOVA T20/GS 25 (SUTURE) IMPLANT
SUT PDS AB 1 CT1 27 (SUTURE) ×4 IMPLANT
SUT SILK 2 0 (SUTURE) ×3
SUT SILK 2 0 SH CR/8 (SUTURE) ×5 IMPLANT
SUT SILK 2 0SH CR/8 30 (SUTURE) IMPLANT
SUT SILK 2-0 18XBRD TIE 12 (SUTURE) ×1 IMPLANT
SUT SILK 2-0 30XBRD TIE 12 (SUTURE) IMPLANT
SUT SILK 3 0 (SUTURE) ×3
SUT SILK 3 0 SH CR/8 (SUTURE) ×3 IMPLANT
SUT SILK 3-0 18XBRD TIE 12 (SUTURE) ×2 IMPLANT
SUT VIC AB 4-0 SH 18 (SUTURE) IMPLANT
YANKAUER SUCT BULB TIP NO VENT (SUCTIONS) ×3 IMPLANT

## 2017-08-17 NOTE — Anesthesia Preprocedure Evaluation (Addendum)
Anesthesia Evaluation  Patient identified by MRN, date of birth, ID band Patient awake    Reviewed: Allergy & Precautions, NPO status , Patient's Chart, lab work & pertinent test results  Airway Mallampati: II  TM Distance: >3 FB Neck ROM: Full    Dental no notable dental hx.    Pulmonary neg pulmonary ROS, former smoker,    Pulmonary exam normal breath sounds clear to auscultation       Cardiovascular hypertension, Normal cardiovascular exam Rhythm:Regular Rate:Normal     Neuro/Psych negative psych ROS   GI/Hepatic negative GI ROS, Neg liver ROS, GERD  ,  Endo/Other  negative endocrine ROS  Renal/GU negative Renal ROS     Musculoskeletal negative musculoskeletal ROS (+) Arthritis ,   Abdominal   Peds  Hematology negative hematology ROS (+)   Anesthesia Other Findings   Reproductive/Obstetrics negative OB ROS                             Anesthesia Physical Anesthesia Plan  ASA: III  Anesthesia Plan: General   Post-op Pain Management:    Induction: Intravenous  PONV Risk Score and Plan: 4 or greater and Ondansetron, Dexamethasone and Treatment may vary due to age or medical condition  Airway Management Planned:   Additional Equipment:   Intra-op Plan:   Post-operative Plan: Extubation in OR  Informed Consent: I have reviewed the patients History and Physical, chart, labs and discussed the procedure including the risks, benefits and alternatives for the proposed anesthesia with the patient or authorized representative who has indicated his/her understanding and acceptance.   Dental advisory given  Plan Discussed with: CRNA  Anesthesia Plan Comments:         Anesthesia Quick Evaluation

## 2017-08-17 NOTE — Anesthesia Postprocedure Evaluation (Signed)
Anesthesia Post Note  Patient: Abigail [name]  Procedure(s) Performed: OPEN ILEOCECETOMY (N/A Abdomen)     Patient location during evaluation: PACU Anesthesia Type: General Level of consciousness: awake and alert and oriented Pain management: pain level controlled Vital Signs Assessment: post-procedure vital signs reviewed and stable Respiratory status: spontaneous breathing, nonlabored ventilation, respiratory function stable and patient connected to nasal cannula oxygen Cardiovascular status: blood pressure returned to baseline and stable Postop Assessment: no apparent nausea or vomiting Anesthetic complications: no    Last Vitals:  Vitals:   08/17/17 1500 08/17/17 1515  BP: (!) 160/72 (!) 163/75  Pulse: (!) 59 67  Resp: 12 19  Temp:  36.6 C  SpO2: 99% 100%    Last Pain:  Vitals:   08/17/17 1515  TempSrc:   PainSc: 4                  Nickalos Petersen A.

## 2017-08-17 NOTE — Brief Op Note (Signed)
08/17/2017  1:36 PM  PATIENT:  Abigail [name]  76 y.o. female  PRE-OPERATIVE DIAGNOSIS:  neoplasm of uncertain behavior of appendix  POST-OPERATIVE DIAGNOSIS:  neoplasm of uncertain behavior of appendix  PROCEDURE:  Procedure(s) with comments: OPEN ILEOCECETOMY (N/A) - ERAS PATHWAY  SURGEON:  Surgeon(s) and Role:    Armandina Gemma, MD - Primary  ANESTHESIA:   general  EBL:  50 mL   BLOOD ADMINISTERED:none  DRAINS: none   LOCAL MEDICATIONS USED:  OTHER  Exparel 20 cc  SPECIMEN:  Excision  DISPOSITION OF SPECIMEN:  PATHOLOGY  COUNTS:  YES  TOURNIQUET:  * No tourniquets in log *  DICTATION: .Other Dictation: Dictation Number [id number]  PLAN OF CARE: Admit to inpatient   PATIENT DISPOSITION:  PACU - hemodynamically stable.   Delay start of Pharmacological VTE agent (>24hrs) due to surgical blood loss or risk of bleeding: yes  Armandina Gemma, MD [facility] Office: [phone]

## 2017-08-17 NOTE — Anesthesia Procedure Notes (Signed)
Procedure Name: Intubation Date/Time: 08/17/2017 12:15 PM Performed by: [place] Pugh Pre-anesthesia Checklist: Patient identified, Emergency Drugs available, Suction available, Patient being monitored and Timeout performed Patient Re-evaluated:Patient Re-evaluated prior to induction Oxygen Delivery Method: Circle system utilized Preoxygenation: Pre-oxygenation with 100% oxygen Induction Type: IV induction Ventilation: Mask ventilation without difficulty Laryngoscope Size: Mac and 3 Grade View: Grade I Tube type: Oral Tube size: 7.5 mm Number of attempts: 1 Airway Equipment and Method: Stylet Placement Confirmation: ETT inserted through vocal cords under direct vision,  positive ETCO2,  CO2 detector and breath sounds checked- equal and bilateral Secured at: 20 cm Tube secured with: Tape Dental Injury: Teeth and Oropharynx as per pre-operative assessment

## 2017-08-17 NOTE — Interval H&P Note (Signed)
History and Physical Interval Note:  08/17/2017 11:51 AM  Abigail [name]  has presented today for surgery, with the diagnosis of neoplasm of uncertain behavior of appendix  The various methods of treatment have been discussed with the patient and family. After consideration of risks, benefits and other options for treatment, the patient has consented to    Procedure(s): [place] (N/A) as a surgical intervention .    The patient's history has been reviewed, patient examined, no change in status, stable for surgery.  I have reviewed the patient's chart and labs.  Questions were answered to the patient's satisfaction.    Armandina Gemma, [place] [facility] Office: [place]

## 2017-08-17 NOTE — Transfer of Care (Signed)
Immediate Anesthesia Transfer of Care Note  Patient: Abigail [name]  Procedure(s) Performed: OPEN ILEOCECETOMY (N/A Abdomen)  Patient Location: PACU  Anesthesia Type:General  Level of Consciousness: oriented, sedated and patient cooperative  Airway & Oxygen Therapy: Patient Spontanous Breathing and Patient connected to face mask  Post-op Assessment: Report given to RN, Post -op Vital signs reviewed and stable and Patient moving all extremities X 4  Post vital signs: Reviewed and stable  Last Vitals:  Vitals:   08/17/17 1344 08/17/17 1345  BP: (!) 170/93 (!) 180/99  Pulse: 79 82  Resp:  16  Temp:  (!) 36.3 C  SpO2: 100% 100%    Last Pain:  Vitals:   08/17/17 1345  TempSrc:   PainSc: 0-No pain      Patients Stated Pain Goal: 3 (20/80/22 3361)  Complications: No apparent anesthesia complications

## 2017-08-18 MED ORDER — TRAMADOL HCL 50 MG PO TABS
50.0000 mg | ORAL_TABLET | Freq: Four times a day (QID) | ORAL | Status: DC | PRN
  Administered 2017-08-18 – 2017-08-19 (×3): 100 mg via ORAL
  Administered 2017-08-19: 50 mg via ORAL
  Filled 2017-08-18: qty 1
  Filled 2017-08-18 (×3): qty 2

## 2017-08-18 NOTE — Op Note (Signed)
NAME:  Abigail [name], Abigail [name]                       ACCOUNT NO.:  MEDICAL RECORD NO.:  [id number]  LOCATION:                                 FACILITY:  PHYSICIAN:  [name], MD           DATE OF BIRTH:  DATE OF PROCEDURE:  08/17/2017                              OPERATIVE REPORT   PREOPERATIVE DIAGNOSIS:  Cystic neoplasm of appendix.  POSTOPERATIVE DIAGNOSIS:  Cystic neoplasm of appendix.  PROCEDURE:  Ileocecectomy.  SURGEON:  [name], MD.  ANESTHESIA:  General.  PREPARATION:  ChloraPrep.  ESTIMATED BLOOD LOSS:  Minimal.  INDICATIONS:  The patient is a 76 year old white female, who underwent screening colonoscopy on July 13, 2017, by Dr. Silvano Rusk.  The patient was found to have an extrinsic mass at the base of the appendix. CT scan of the abdomen and pelvis on July 20, 2017, shows a 2.6 cm cystic mass with mural calcifications situated at the base of the appendix adjacent to the cecum.  This was suspicious for a mucocele. The patient now comes to Surgery for resection for definitive diagnosis and management.  BODY OF REPORT:  Procedure was done in OR #1 at the [facility].  The patient was brought to the operating room, placed in supine position on the operating room table.  Following administration of general anesthesia, the patient was positioned and then prepped and draped in the usual aseptic fashion.  After ascertaining that an adequate level of anesthesia had been achieved, a transverse right mid abdominal incision was made with #10 blade. Dissection was carried through subcutaneous tissues.  Fascia was incised.  Rectus muscle was divided with the electrocautery.  The posterior sheath was elevated and incised and the peritoneal cavity entered cautiously.  Gelpi retractors were placed in the subcutaneous tissues.  The right colon was identified and the cecum was delivered up and through the wound.  The appendix appears grossly  normal.  There was a palpable cystic tight mass present at the base of the appendix and the cecum.  A point in the mid ascending colon was selected.  Mesentery was incised.  Bowel was transected with a GIA stapler.  Mesentery was divided with the Ethicon Enseal.  Terminal ileum was mobilized. Mesentery was incised and the terminal ileum was transected with the GIA stapler.  Remaining mesentery was divided with the Ethicon Enseal.  The right colic artery was divided between [place] clamps with the enseal and then ligated with 2-0 silk ties.  The ileocolonic resection was then passed off the field and submitted fresh to Pathology for review.  Good hemostasis was noted.  Next, a side-to-side functionally end-to-end anastomosis was created between the terminal ileum and the ascending colon.  This was performed with a GIA stapler.  Staple line appears to be hemostatic.  Enterotomy was closed with a TA-60 stapler.  Mesenteric defect was closed with interrupted 2-0 silk sutures.  Gowns and gloves were changed.  Drapes were changed.  Abdomen was then irrigated with warm saline.  Good hemostasis was obtained along the staple lines with 3-0 silk suture ligatures.  Fluid  was evacuated from the abdomen.  Bowel was returned to the abdominal cavity and covered with the remaining omentum.  The posterior fascia was closed with a running #1 PDS suture.  Wound was irrigated with saline.  Anterior sheath was closed with a running #1 PDS suture.  Wound was again irrigated with saline.  Local anesthetic with Exparel was injected in the fascia and the subcutaneous tissues.  Skin was then closed with a running 3-0 Monocryl subcuticular suture.  Wound was washed and dried and Steri-Strips were applied.  Sterile dressings were applied.  The patient was awakened from anesthesia and brought to the recovery room. The patient tolerated the procedure well.   Armandina Gemma, [place] [facility] Office:  [phone]    TMG/MEDQ  D:  08/17/2017  T:  08/17/2017  Job:  [id number]  cc:   Arlyss Repress, MD Fax: [phone]  Gatha Mayer, MD,FACG [facility] [place], [place] [address]  Dr. Gala Lewandowsky office

## 2017-08-18 NOTE — Progress Notes (Signed)
  General Surgery [facility]  Assessment & Plan: POD#1 - status post ileocecectomy for appendiceal mass  Tolerating clear liquid diet - advance to full liquids  OOB, ambulate in halls  Discontinue gabapentin - begin Tramadol  Continue Custer, MD, [facility]       Office: [phone]    Chief Complaint: Appendiceal mass  Subjective: Mild pain.  Husband at bedside.  Passing flatus.  Objective: Vital signs in last 24 hours: Temp:  [97.3 F (36.3 C)-97.9 F (36.6 C)] 97.8 F (36.6 C) (10/26 0941) Pulse Rate:  [53-82] 71 (10/26 0941) Resp:  [8-19] 16 (10/26 0941) BP: (122-180)/(62-100) 157/80 (10/26 0941) SpO2:  [96 %-100 %] 100 % (10/26 0941) Last BM Date: 08/16/17  Intake/Output from previous day: 10/25 0701 - 10/26 0700 In: 3493.8 [P.O.:480; I.V.:3013.8] Out: 1320 [Urine:1270; Blood:50] Intake/Output this shift: Total I/O In: 420 [P.O.:120; I.V.:300] Out: -   Physical Exam: HEENT - sclerae clear, mucous membranes moist Neck - soft Chest - clear bilaterally Cor - RRR Abdomen - soft without distension; BS present; wound dry and intact Ext - no edema, non-tender Neuro - alert & oriented, no focal deficits  Lab Results:   Recent Labs  08/17/17 1748  WBC 12.6*  HGB 14.1  HCT 40.7  PLT 218   BMET  Recent Labs  08/17/17 1748  CREATININE 0.70   PT/INR No results for input(s): LABPROT, INR in the last 72 hours. Comprehensive Metabolic Panel:    Component Value Date/Time   NA 135 08/14/2017 1401   NA 130 (L) 01/15/2016 1850   K 4.0 08/14/2017 1401   K 4.0 01/15/2016 1850   CL 100 (L) 08/14/2017 1401   CL 96 (L) 01/15/2016 1850   CO2 27 08/14/2017 1401   CO2 21 (L) 01/15/2016 1850   BUN 14 08/14/2017 1401   BUN 7 07/13/2017 1140   CREATININE 0.70 08/17/2017 1748   CREATININE 0.81 08/14/2017 1401   GLUCOSE 98 08/14/2017 1401   GLUCOSE 95 01/15/2016 1850   CALCIUM 9.0  08/14/2017 1401   CALCIUM 9.4 01/15/2016 1850    Studies/Results: No results found.    Abigail [name] 08/18/2017  Patient ID: Abigail [name], female   DOB: 1941/04/02, 76 y.o.   MRN: [id number]

## 2017-08-19 NOTE — Progress Notes (Signed)
  General Surgery [facility]  Assessment & Plan: POD#2 - status post ileocecectomy for appendiceal mass             Tolerating full liquid diet - advance to regular             OOB, ambulate in halls             Tramadol for pain             had BM, discontinue Entereg        [name], MD, [facility]       Office: [phone]    Chief Complaint: Appendiceal mass  Subjective: Patient in bed, some RLQ pain.  Ambulating in halls.  Passing flatus and had small BM.  Objective: Vital signs in last 24 hours: Temp:  [97.6 F (36.4 C)-98.1 F (36.7 C)] 97.6 F (36.4 C) (10/27 0422) Pulse Rate:  [64-68] 65 (10/27 0422) Resp:  [16-18] 18 (10/27 0422) BP: (155-181)/(72-81) 181/77 (10/27 0422) SpO2:  [96 %-98 %] 96 % (10/27 0422) Last BM Date: 08/19/17  Intake/Output from previous day: 10/26 0701 - 10/27 0700 In: 2080 [P.O.:860; I.V.:1220] Out: -  Intake/Output this shift: No intake/output data recorded.  Physical Exam: HEENT - sclerae clear, mucous membranes moist Neck - soft Chest - clear bilaterally Cor - RRR Abdomen - soft, mild distension; BS present; wound clear and dry with dressing intact Ext - no edema, non-tender Neuro - alert & oriented, no focal deficits  Lab Results:   Recent Labs  08/17/17 1748  WBC 12.6*  HGB 14.1  HCT 40.7  PLT 218   BMET  Recent Labs  08/17/17 1748  CREATININE 0.70   PT/INR No results for input(s): LABPROT, INR in the last 72 hours. Comprehensive Metabolic Panel:    Component Value Date/Time   NA 135 08/14/2017 1401   NA 130 (L) 01/15/2016 1850   K 4.0 08/14/2017 1401   K 4.0 01/15/2016 1850   CL 100 (L) 08/14/2017 1401   CL 96 (L) 01/15/2016 1850   CO2 27 08/14/2017 1401   CO2 21 (L) 01/15/2016 1850   BUN 14 08/14/2017 1401   BUN 7 07/13/2017 1140   CREATININE 0.70 08/17/2017 1748   CREATININE 0.81 08/14/2017 1401   GLUCOSE 98 08/14/2017 1401   GLUCOSE  95 01/15/2016 1850   CALCIUM 9.0 08/14/2017 1401   CALCIUM 9.4 01/15/2016 1850    Studies/Results: No results found.    Susa Bones M 08/19/2017  Patient ID: Abigail [name], female   DOB: Apr 30, 1941, 76 y.o.   MRN: [id number]

## 2017-08-20 MED ORDER — TRAMADOL HCL 50 MG PO TABS: 50.0000 mg | ORAL_TABLET | Freq: Four times a day (QID) | ORAL | 0 refills | Status: DC | PRN

## 2017-08-20 NOTE — Progress Notes (Signed)
3 Days Post-Op   Subjective/Chief Complaint: Feels better. Having loose bm's   Objective: Vital signs in last 24 hours: Temp:  [97.9 F (36.6 C)-99 F (37.2 C)] 97.9 F (36.6 C) (10/28 0620) Pulse Rate:  [74-81] 79 (10/28 0620) Resp:  [18] 18 (10/28 0620) BP: (157-167)/(86-88) 167/86 (10/27 2135) SpO2:  [96 %-98 %] 98 % (10/28 0620) Last BM Date: 08/19/17  Intake/Output from previous day: 10/27 0701 - 10/28 0700 In: 1760 [P.O.:660; I.V.:1100] Out: -  Intake/Output this shift: No intake/output data recorded.  General appearance: alert and cooperative Resp: clear to auscultation bilaterally Cardio: regular rate and rhythm GI: soft, minimal tenderness. incision good  Lab Results:   Recent Labs  08/17/17 1748  WBC 12.6*  HGB 14.1  HCT 40.7  PLT 218   BMET  Recent Labs  08/17/17 1748  CREATININE 0.70   PT/INR No results for input(s): LABPROT, INR in the last 72 hours. ABG No results for input(s): PHART, HCO3 in the last 72 hours.  Invalid input(s): PCO2, PO2  Studies/Results: No results found.  Anti-infectives: Anti-infectives    Start     Dose/Rate Route Frequency Ordered Stop   08/17/17 1015  ertapenem (INVANZ) 1 g in sodium chloride 0.9 % 50 mL IVPB     1 g 100 mL/hr over 30 Minutes Intravenous  Once 08/17/17 1011 08/17/17 1250   08/16/17 1123  ertapenem (INVANZ) 1 g in sodium chloride 0.9 % 50 mL IVPB  Status:  Discontinued     1 g 100 mL/hr over 30 Minutes Intravenous On call to O.R. 08/16/17 1123 08/17/17 0559      Assessment/Plan: s/p Procedure(s) with comments: OPEN ILEOCECETOMY (N/A) - ERAS PATHWAY Advance diet Discharge  LOS: 3 days    TOTH III,PAUL S 08/20/2017

## 2017-08-23 NOTE — Progress Notes (Signed)
Please contact patient and notify of benign pathology results.  [name] [name], MD, FACS [facility] Office: [phone]   

## 2017-08-23 NOTE — Discharge Summary (Signed)
Physician Discharge Summary [facility]  Patient ID: Abigail [name] MRN: [id number] DOB/AGE: Apr 08, 1941 76 y.o.  Admit date: 08/17/2017 Discharge date: 08/23/2017  Admission Diagnoses:  Cystic neoplasm of appendix  Discharge Diagnoses:  Principal Problem:   Neoplasm of uncertain behavior of appendix   Discharged Condition: good  Hospital Course: Patient was admitted for observation following abdominal surgery.  Post op course was uncomplicated.  Pain was well controlled.  Tolerated diet as advanced. Patient was prepared for discharge home on POD#3.  Consults: None  Treatments: surgery: ileocecectomy (open)  Discharge Exam: Blood pressure (!) 163/92, pulse 79, temperature 97.9 F (36.6 C), temperature source Oral, resp. rate 18, height 5\' 6"  (1.676 m), [name] 54.6 kg (120 lb 6.4 oz), SpO2 98 %.  See progress note day of discharge.  Disposition: Home  Discharge Instructions    Call MD for:  difficulty breathing, headache or visual disturbances    Complete by:  As directed    Call MD for:  extreme fatigue    Complete by:  As directed    Call MD for:  hives    Complete by:  As directed    Call MD for:  persistant dizziness or light-headedness    Complete by:  As directed    Call MD for:  persistant nausea and vomiting    Complete by:  As directed    Call MD for:  redness, tenderness, or signs of infection (pain, swelling, redness, odor or green/yellow discharge around incision site)    Complete by:  As directed    Call MD for:  severe uncontrolled pain    Complete by:  As directed    Call MD for:  temperature >100.4    Complete by:  As directed    Diet - low sodium heart healthy    Complete by:  As directed    Discharge instructions    Complete by:  As directed    May shower. Diet as tolerated. No heavy lifting   Increase activity slowly    Complete by:  As directed    No wound care    Complete by:  As directed      Allergies as of 08/20/2017       Reactions   Statins Diarrhea, Nausea And Vomiting      Medication List    TAKE these medications   acetaminophen 500 MG tablet Commonly known as:  TYLENOL Take 1,000 mg by mouth every 6 (six) hours as needed.   doxycycline 100 MG tablet Commonly known as:  VIBRA-TABS Take 50 mg by mouth 2 (two) times daily.   EXCEDRIN BACK & BODY 250-250 MG tablet Generic drug:  Acetaminophen-Aspirin Buffered Take 0.05 tablets by mouth 2 (two) times daily.   gabapentin 100 MG capsule Commonly known as:  NEURONTIN Take 2 capsules (200 mg total) by mouth at bedtime. What changed:  when to take this  reasons to take this   LORazepam 0.5 MG tablet Commonly known as:  ATIVAN Take 0.5 mg by mouth 2 (two) times daily as needed for anxiety.   losartan 25 MG tablet Commonly known as:  COZAAR Take 25 mg by mouth at bedtime.   metroNIDAZOLE 0.75 % gel Commonly known as:  METROGEL Apply 1 application topically as needed.   traMADol 50 MG tablet Commonly known as:  ULTRAM Take 1-2 tablets (50-100 mg total) by mouth every 6 (six) hours as needed for moderate pain.      Follow-up Information  Armandina Gemma, MD Follow up in 2 week(s).   Specialty:  General Surgery Contact information: [address] [place] [address] [phone]           [name], MD, [facility] Office: [phone]   Signed: [name] 08/23/2017, 9:13 AM

## 2017-11-05 NOTE — Progress Notes (Signed)
[name] [employer], [place] [address] Phone: [phone] Subjective:     CC: Knee pain follow-up  DZH:[id number]  Abigail [name] is a 77 y.o. female coming in with complaint of pain bilaterally.  Found to have a Baker's cyst of the left knee that has needed intermittent aspiration from time to time.  Patient states that 6 months ago she was here and since then she has had an accumulation of fluid on the left knee. She has been having some aching.      Past Medical History:  Diagnosis Date  . Arthritis    oa  . Baker's cyst    behind left knee  . Constipation   . Hemorrhoids   . Hypertension   . MVP (mitral valve prolapse)   . Neoplasm    of appendix  . Rosacea    Past Surgical History:  Procedure Laterality Date  . AUGMENTATION MAMMAPLASTY Bilateral 1990   saline  . CARDIAC CATHETERIZATION  17years ago   small amount of blockage  . CATRACTS Bilateral   . COLONOSCOPY  2006 and oct 2018  . ILEOCECETOMY N/A 08/17/2017   Procedure: OPEN ILEOCECETOMY;  Surgeon: Armandina Gemma, MD;  Location: [facility];  Service: General;  Laterality: N/A;  ERAS PATHWAY   Social History   Socioeconomic History  . Marital status: Married    Spouse name: Not on file  . Number of children: Not on file  . Years of education: Not on file  . Highest education level: Not on file  Social Needs  . Financial resource strain: Not on file  . Food insecurity - worry: Not on file  . Food insecurity - inability: Not on file  . Transportation needs - medical: Not on file  . Transportation needs - non-medical: Not on file  Occupational History  . Not on file  Tobacco Use  . Smoking status: Former Smoker    Packs/day: 0.50    Years: 10.00    Pack years: 5.00    Types: Cigarettes  . Smokeless tobacco: Never Used  . Tobacco comment: quit 35 yrs ago  Substance and Sexual Activity  . Alcohol use: Yes    Alcohol/week: 4.2 oz    Types: 7 Glasses of wine per  week    Comment: 1 glass red wine  per night  . Drug use: No  . Sexual activity: Not on file  Other Topics Concern  . Not on file  Social History Narrative  . Not on file   Allergies  Allergen Reactions  . Statins Diarrhea and Nausea And Vomiting   Family History  Problem Relation Age of Onset  . Colon cancer Cousin        58's maybe     Past medical history, social, surgical and family history all reviewed in electronic medical record.  No pertanent information unless stated regarding to the chief complaint.   Review of Systems:Review of systems updated and as accurate as of 11/06/17  No headache, visual changes, nausea, vomiting, diarrhea, constipation, dizziness, abdominal pain, skin rash, fevers, chills, night sweats, weight loss, swollen lymph nodes, body aches,, chest pain, shortness of breath, mood changes.  Positive joint swelling and muscle aches  Objective  Blood pressure (!) 140/100, height 5\' 6"  (1.676 m), weight 118 lb (53.5 kg). Systems examined below as of 11/06/17   General: No apparent distress alert and oriented x3 mood and affect normal, dressed appropriately.  HEENT: Pupils equal, extraocular  movements intact  Respiratory: Patient's speak in full sentences and does not appear short of breath  Cardiovascular: No lower extremity edema, non tender, no erythema  Skin: Warm dry intact with no signs of infection or rash on extremities or on axial skeleton.  Abdomen: Soft nontender  Neuro: Cranial nerves II through XII are intact, neurovascularly intact in all extremities with 2+ DTRs and 2+ pulses.  Lymph: No lymphadenopathy of posterior or anterior cervical chain or axillae bilaterally.  Gait normal with good balance and coordination.  MSK:  Non tender with full range of motion and good stability and symmetric strength and tone of shoulders, elbows, wrist, hip, knee and ankles bilaterally.  Knee: Left valgus deformity noted.  Abnormal thigh to calf ratio.  Large  Baker's cyst noted Tender to palpation over medial and PF joint line.  ROM full in flexion and extension and lower leg rotation. instability with valgus force.  painful patellar compression. Patellar glide with moderate crepitus. Patellar and quadriceps tendons unremarkable. Hamstring and quadriceps strength is normal. Contralateral knee shows arthritic changes with pain over the medial joint line we will as well as some instability but no Baker's cyst.  Procedure: Real-time Ultrasound Guided Injection of left knee Device: GE Logiq Q7 Ultrasound guided injection is preferred based studies that show increased duration, increased effect, greater accuracy, decreased procedural pain, increased response rate, and decreased cost with ultrasound guided versus blind injection.  Verbal informed consent obtained.  Time-out conducted.  Noted no overlying erythema, induration, or other signs of local infection.  Skin prepped in a sterile fashion.  Local anesthesia: Topical Ethyl chloride.  With sterile technique and under real time ultrasound guidance: With a 22-gauge 2 inch needle patient was injected with 4 cc of 0.5% Marcaine and aspirated 60 cc of straw-colored fluid then injected 1 cc of Kenalog 40 mg/dL. This was from a posterior  approach.  Completed without difficulty  Pain immediately resolved suggesting accurate placement of the medication.  Advised to call if fevers/chills, erythema, induration, drainage, or persistent bleeding.  Images permanently stored and available for review in the ultrasound unit.  Impression: Technically successful ultrasound guided injection.   Impression and Recommendations:     This case required medical decision making of moderate complexity.      Note: This dictation was prepared with Dragon dictation along with smaller phrase technology. Any transcriptional errors that result from this process are unintentional.

## 2017-11-06 ENCOUNTER — Ambulatory Visit (INDEPENDENT_AMBULATORY_CARE_PROVIDER_SITE_OTHER): Payer: Medicare Other | Admitting: Family Medicine

## 2017-11-06 ENCOUNTER — Encounter: Payer: Self-pay | Admitting: Family Medicine

## 2017-11-06 ENCOUNTER — Ambulatory Visit: Payer: Self-pay

## 2017-11-06 VITALS — BP 140/100 | Ht 66.0 in | Wt 118.0 lb

## 2017-11-06 DIAGNOSIS — M1712 Unilateral primary osteoarthritis, left knee: Secondary | ICD-10-CM | POA: Diagnosis not present

## 2017-11-06 DIAGNOSIS — M25562 Pain in left knee: Principal | ICD-10-CM

## 2017-11-06 DIAGNOSIS — G8929 Other chronic pain: Secondary | ICD-10-CM

## 2017-11-06 NOTE — Patient Instructions (Signed)
Good to see you as always You know the drill with the knee.  Ice is your friend.  For the hand lets watch it and if getting bigger we will drain it in 4 weeks Happy New Year!

## 2017-11-06 NOTE — Assessment & Plan Note (Signed)
Aspirated today and tolerated the procedure well.  We discussed icing regimen and home exercise.  We discussed which activities doing which would avoid.  Patient will try to increase activity slowly over the course of next several days.  Follow-up with me again 4 weeks.  Could be a candidate for Visco supplementation

## 2017-11-08 DIAGNOSIS — I10 Essential (primary) hypertension: Secondary | ICD-10-CM | POA: Diagnosis not present

## 2017-11-08 DIAGNOSIS — I2510 Atherosclerotic heart disease of native coronary artery without angina pectoris: Secondary | ICD-10-CM | POA: Diagnosis not present

## 2017-11-08 DIAGNOSIS — E039 Hypothyroidism, unspecified: Secondary | ICD-10-CM | POA: Diagnosis not present

## 2017-11-13 DIAGNOSIS — E039 Hypothyroidism, unspecified: Secondary | ICD-10-CM | POA: Diagnosis not present

## 2017-11-13 DIAGNOSIS — E785 Hyperlipidemia, unspecified: Secondary | ICD-10-CM | POA: Diagnosis not present

## 2017-11-13 DIAGNOSIS — I10 Essential (primary) hypertension: Secondary | ICD-10-CM | POA: Diagnosis not present

## 2018-01-08 DIAGNOSIS — Z961 Presence of intraocular lens: Secondary | ICD-10-CM | POA: Diagnosis not present

## 2018-01-08 DIAGNOSIS — H01004 Unspecified blepharitis left upper eyelid: Secondary | ICD-10-CM | POA: Diagnosis not present

## 2018-01-08 DIAGNOSIS — H01001 Unspecified blepharitis right upper eyelid: Secondary | ICD-10-CM | POA: Diagnosis not present

## 2018-01-08 DIAGNOSIS — H52203 Unspecified astigmatism, bilateral: Secondary | ICD-10-CM | POA: Diagnosis not present

## 2018-03-18 NOTE — Progress Notes (Signed)
[name] [address] [place], [place] [address] Phone: [phone] Subjective:      CC: Left knee pain and swelling  TGG:YIRSWNIOEV  Abigail [name] is a 77 y.o. female coming in with complaint of left knee pain and swelling.  Has known bilateral knee arthritis.  Does not want any surgical intervention.  Has a large Baker's cyst on the left knee reaccumulate from time to time.  Last aspiration was November 06, 2017.  Starting to affect daily activities.  States that it is affecting her gait as well.  Not being as active secondary to the discomfort and pain.     Past Medical History:  Diagnosis Date  . Arthritis    oa  . Baker's cyst    behind left knee  . Constipation   . Hemorrhoids   . Hypertension   . MVP (mitral valve prolapse)   . Neoplasm    of appendix  . Rosacea    Past Surgical History:  Procedure Laterality Date  . AUGMENTATION MAMMAPLASTY Bilateral 1990   saline  . CARDIAC CATHETERIZATION  17years ago   small amount of blockage  . CATRACTS Bilateral   . COLONOSCOPY  2006 and oct 2018  . ILEOCECETOMY N/A 08/17/2017   Procedure: OPEN ILEOCECETOMY;  Surgeon: Armandina Gemma, MD;  Location: [facility];  Service: General;  Laterality: N/A;  [facility]   Social History   Socioeconomic History  . Marital status: Married    Spouse name: Not on file  . Number of children: Not on file  . Years of education: Not on file  . Highest education level: Not on file  Occupational History  . Not on file  Social Needs  . Financial resource strain: Not on file  . Food insecurity:    Worry: Not on file    Inability: Not on file  . Transportation needs:    Medical: Not on file    Non-medical: Not on file  Tobacco Use  . Smoking status: Former Smoker    Packs/day: 0.50    Years: 10.00    Pack years: 5.00    Types: Cigarettes  . Smokeless tobacco: Never Used  . Tobacco comment: quit 35 yrs ago  Substance and Sexual Activity  . Alcohol use:  Yes    Alcohol/week: 4.2 oz    Types: 7 Glasses of wine per week    Comment: 1 glass red wine  per night  . Drug use: No  . Sexual activity: Not on file  Lifestyle  . Physical activity:    Days per week: Not on file    Minutes per session: Not on file  . Stress: Not on file  Relationships  . Social connections:    Talks on phone: Not on file    Gets together: Not on file    Attends religious service: Not on file    Active member of club or organization: Not on file    Attends meetings of clubs or organizations: Not on file    Relationship status: Not on file  Other Topics Concern  . Not on file  Social History Narrative  . Not on file   Allergies  Allergen Reactions  . Statins Diarrhea and Nausea And Vomiting   Family History  Problem Relation Age of Onset  . Colon cancer Cousin        4's maybe     Past medical history, social, surgical and family history all reviewed in  electronic medical record.  No pertanent information unless stated regarding to the chief complaint.   Review of Systems:Review of systems updated and as accurate as of 03/20/18  No headache, visual changes, nausea, vomiting, diarrhea, constipation, dizziness, abdominal pain, skin rash, fevers, chills, night sweats, weight loss, swollen lymph nodes, body aches, joint swelling, muscle aches, chest pain, shortness of breath, mood changes.   Objective  Blood pressure 120/70, pulse 62, height 5\' 6"  (1.676 m), weight 120 lb (54.4 kg), SpO2 95 %. Systems examined below as of 03/20/18   General: No apparent distress alert and oriented x3 mood and affect normal, dressed appropriately.  HEENT: Pupils equal, extraocular movements intact  Respiratory: Patient's speak in full sentences and does not appear short of breath  Cardiovascular: No lower extremity edema, non tender, no erythema  Skin: Warm dry intact with no signs of infection or rash on extremities or on axial skeleton.  Abdomen: Soft nontender    Neuro: Cranial nerves II through XII are intact, neurovascularly intact in all extremities with 2+ DTRs and 2+ pulses.  Lymph: No lymphadenopathy of posterior or anterior cervical chain or axillae bilaterally.  Gait antalgic MSK:  Non tender with full range of motion and good stability and symmetric strength and tone of shoulders, elbows, wrist, hip, and ankles bilaterally.  Arthritic changes of multiple joints Knee: Left valgus deformity noted.  Abnormal thigh to calf ratio.  Tender to palpation over medial and PF joint line.  More pain over the popliteal area. ROM full in flexion and extension and lower leg rotation. instability with valgus force.  painful patellar compression. Patellar glide with moderate crepitus. Patellar and quadriceps tendons unremarkable. Hamstring and quadriceps strength is normal. Contralateral knee shows arthritic changes with some mild instability and pain over the medial joint line   Procedure: Real-time Ultrasound Guided Injection of left knee Device: GE Logiq Q7 Ultrasound guided injection is preferred based studies that show increased duration, increased effect, greater accuracy, decreased procedural pain, increased response rate, and decreased cost with ultrasound guided versus blind injection.  Verbal informed consent obtained.  Time-out conducted.  Noted no overlying erythema, induration, or other signs of local infection.  Skin prepped in a sterile fashion.  Local anesthesia: Topical Ethyl chloride.  With sterile technique and under real time ultrasound guidance: With a 22-gauge 2 inch needle patient was injected with 4 cc of 0.5% Marcaine and aspirated 60 cc of strawlike color fluid then injected 1 cc of Kenalog 40 mg/dL. This was from a posterior  approach.  Completed without difficulty  Pain immediately resolved suggesting accurate placement of the medication.  Advised to call if fevers/chills, erythema, induration, drainage, or persistent bleeding.   Images permanently stored and available for review in the ultrasound unit.  Impression: Technically successful ultrasound guided injection.   Impression and Recommendations:     This case required medical decision making of moderate complexity.      Note: This dictation was prepared with Dragon dictation along with smaller phrase technology. Any transcriptional errors that result from this process are unintentional.

## 2018-03-20 ENCOUNTER — Ambulatory Visit (INDEPENDENT_AMBULATORY_CARE_PROVIDER_SITE_OTHER): Payer: Medicare Other | Admitting: Family Medicine

## 2018-03-20 ENCOUNTER — Ambulatory Visit: Payer: Self-pay

## 2018-03-20 ENCOUNTER — Encounter: Payer: Self-pay | Admitting: Family Medicine

## 2018-03-20 ENCOUNTER — Other Ambulatory Visit: Payer: Medicare Other

## 2018-03-20 VITALS — BP 120/70 | HR 62 | Ht 66.0 in | Wt 120.0 lb

## 2018-03-20 DIAGNOSIS — M25562 Pain in left knee: Principal | ICD-10-CM

## 2018-03-20 DIAGNOSIS — G8929 Other chronic pain: Secondary | ICD-10-CM

## 2018-03-20 DIAGNOSIS — M7122 Synovial cyst of popliteal space [Baker], left knee: Secondary | ICD-10-CM | POA: Diagnosis not present

## 2018-03-20 DIAGNOSIS — M1712 Unilateral primary osteoarthritis, left knee: Secondary | ICD-10-CM | POA: Diagnosis not present

## 2018-03-20 NOTE — Patient Instructions (Signed)
Good to see you  [name] is your friend Stay active You know where I am if you need me [phone] if you need [place]

## 2018-03-20 NOTE — Assessment & Plan Note (Signed)
Patient had aspiration not having another.  Discussed icing regimen and home exercises.  Discussed which activities to doing which wants to avoid.  Topical anti-inflammatories given.  Follow-up again in 4 to 6 weeks

## 2018-03-21 LAB — SYNOVIAL CELL COUNT + DIFF, W/ CRYSTALS: WBC, SYNOVIAL: 100 {cells}/uL (ref <150)

## 2018-04-03 IMAGING — CT CT ABD-PELV W/ CM
2 of 5 series · 16 of 46 positions shown, 18 images · IV contrast (ISOVUE 300)
Comparison: None

CLINICAL DATA: Evaluate mass on the appendix.

EXAM:
CT ABDOMEN AND PELVIS WITH CONTRAST
TECHNIQUE: Multidetector CT imaging of the abdomen and pelvis was performed
using the standard protocol following bolus administration of
intravenous contrast.
CONTRAST:  100mL [id number] IOPAMIDOL (ZX54XH-1VV) INJECTION 61%

[Series 2: abd/pel w · axial · 0.75mm/px · z∈[-392,-72]mm · 13 of 73 slices shown, 15 images]
[im 5/73  soft-tissue]
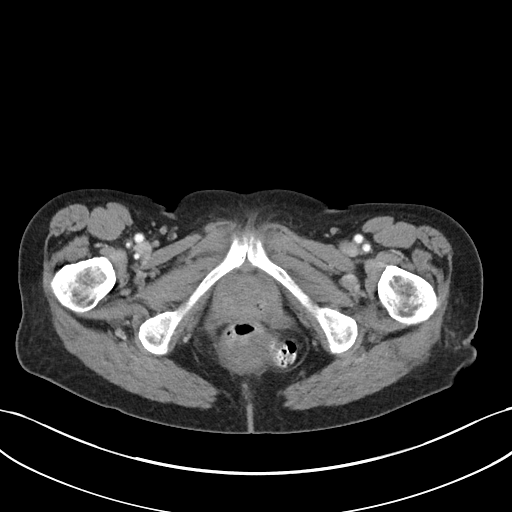
[im 5/73  bone]
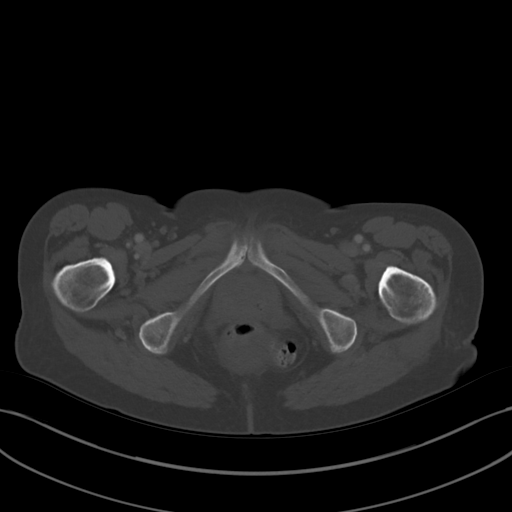
[im 9/73  soft-tissue]
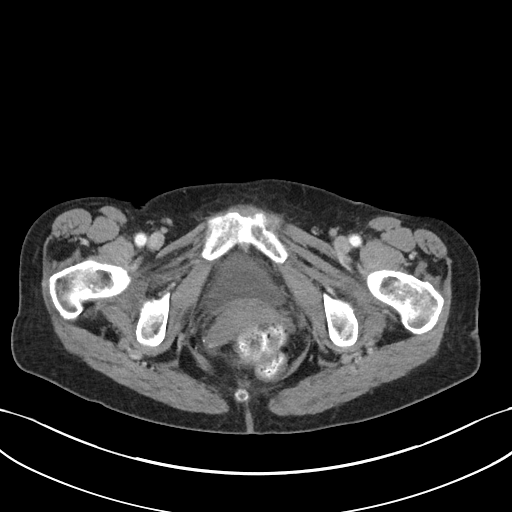
[im 17/73  soft-tissue]
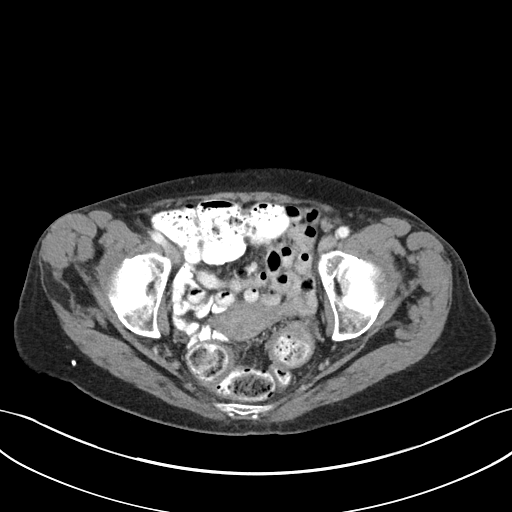
[im 21/73  soft-tissue]
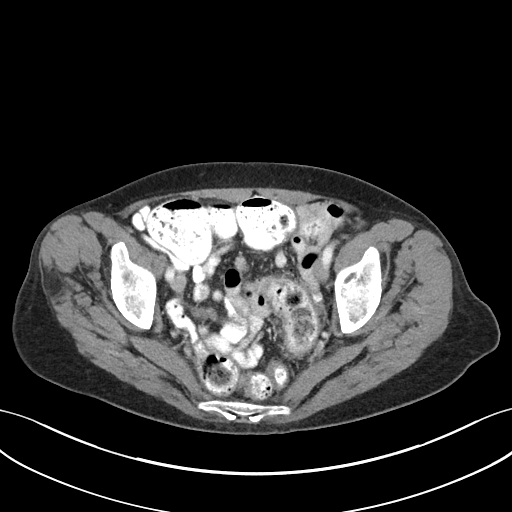
[im 25/73  soft-tissue]
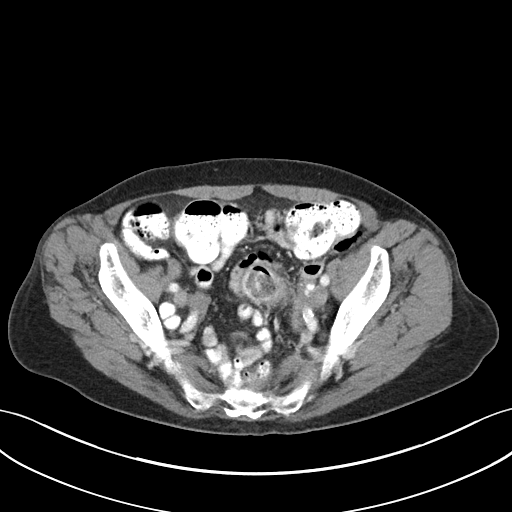
[im 33/73  soft-tissue]
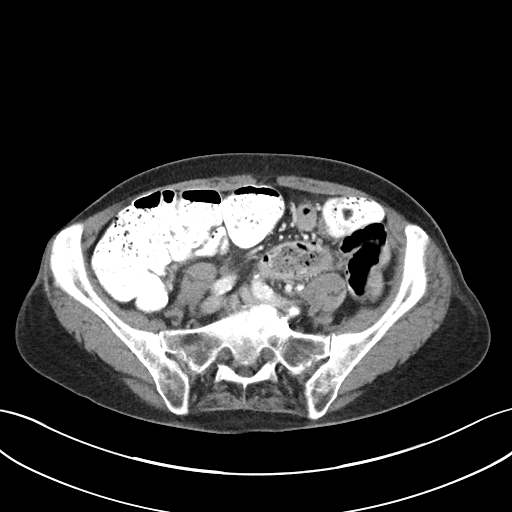
[im 37/73  soft-tissue]
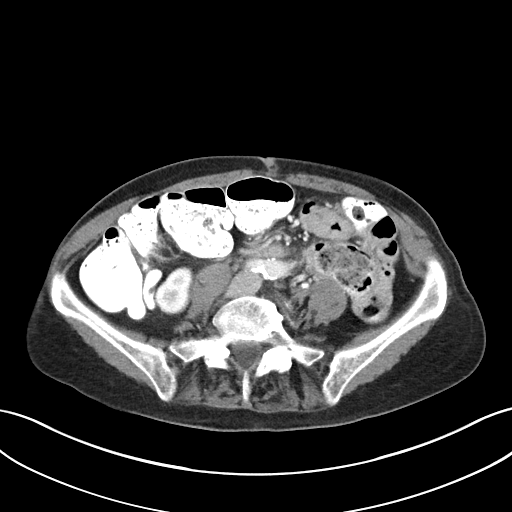
[im 41/73  soft-tissue]
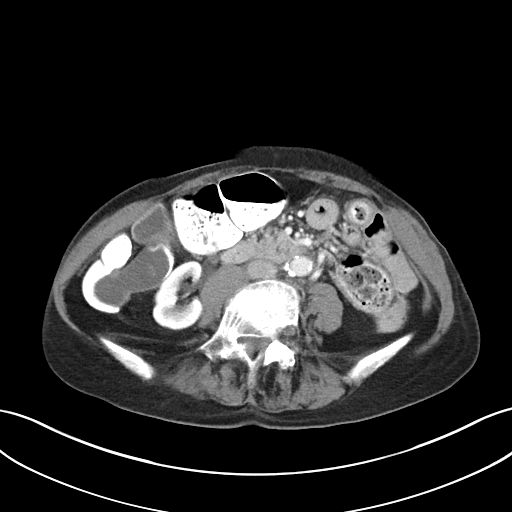
[im 49/73  soft-tissue]
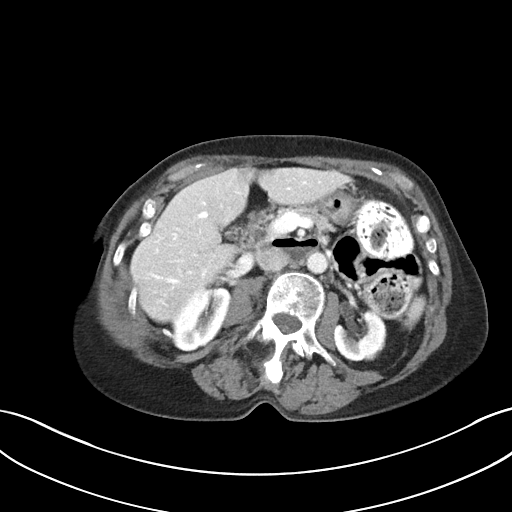
[im 49/73  bone]
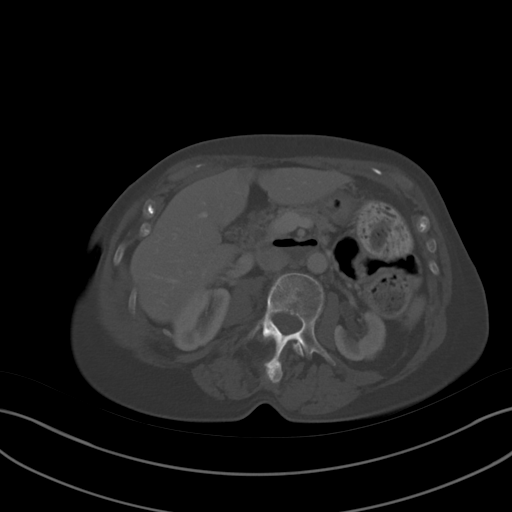
[im 53/73  soft-tissue]
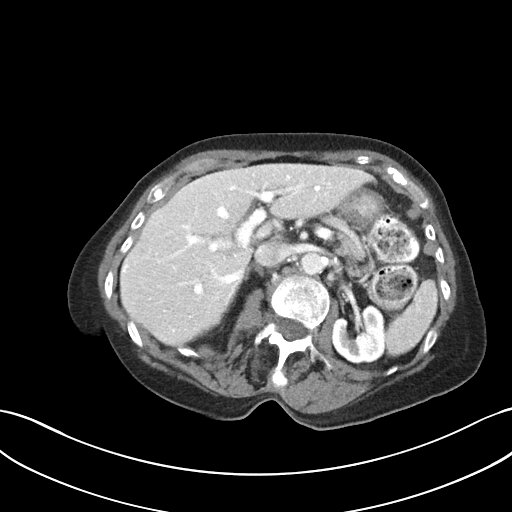
[im 57/73  soft-tissue]
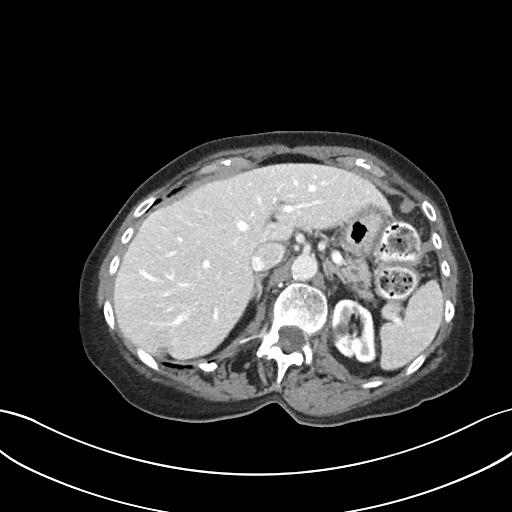
[im 65/73  soft-tissue]
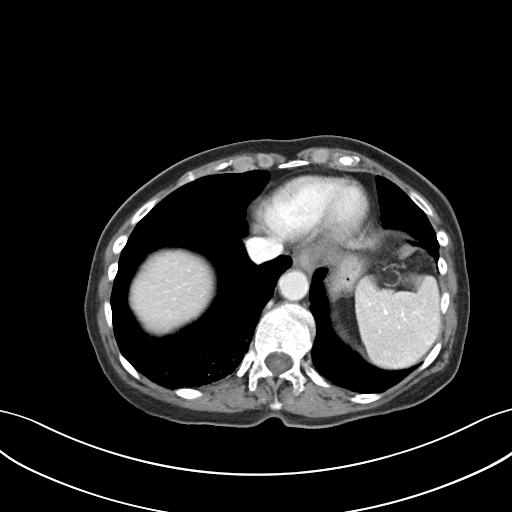
[im 69/73  soft-tissue]
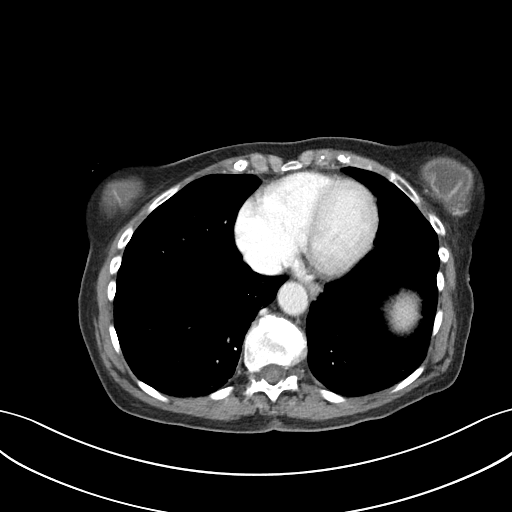

[Series 5: abd/pel w st · coronal · 0.64mm/px · 3 of 73 slices shown]
[im 25/73  soft-tissue]
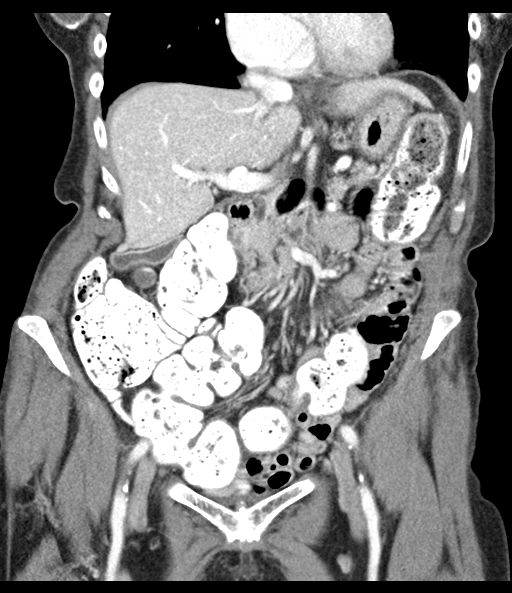
[im 33/73  soft-tissue]
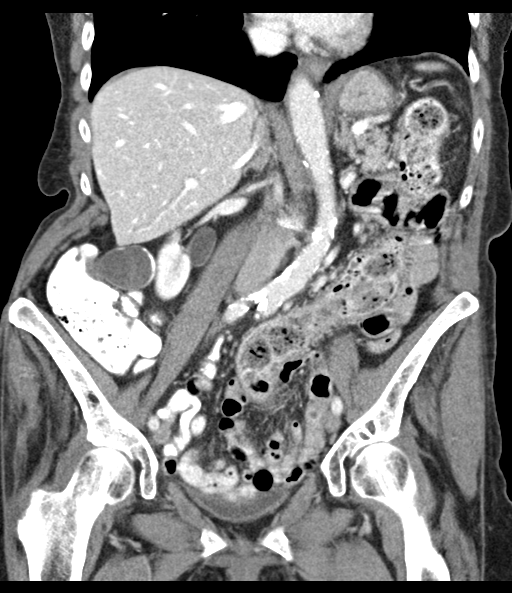
[im 41/73  soft-tissue]
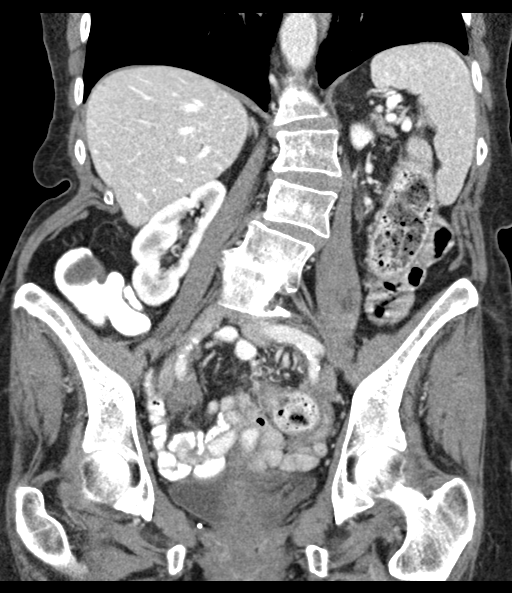

[16 of 46 positions shown; findings below may reference images not displayed]

FINDINGS: Lower chest: The lung bases appear clear.

Hepatobiliary: There is no focal liver abnormality identified.
Previous cholecystectomy. No biliary dilatation.

Pancreas: Unremarkable. No pancreatic ductal dilatation or
surrounding inflammatory changes.

Spleen: Normal appearance of the spleen.

Adrenals/Urinary Tract: The adrenal glands are unremarkable. Normal
appearance of the right kidney. Left kidney cyst measures 1.9 cm,
image 16 of series 2. Urinary bladder appears normal.

Stomach/Bowel: The small bowel loops have a normal course and
caliber. No bowel obstruction. Dilated tubular structure within the
right lower quadrant of the abdomen originating from the cecum has a
maximum diameter of 2.6 cm, areas of mural calcification identified.
This measures 28.6 Hounsfield units internally. No pathologic
dilatation of the colon. A moderate stool burden is identified.

Vascular/Lymphatic: Aortic atherosclerosis. No aneurysm. No upper
abdominal adenopathy. No pelvic or inguinal adenopathy.

Reproductive: The uterus and adnexal structures are unremarkable.

Other: No free fluid or fluid collections identified.

Musculoskeletal: Scoliosis and degenerative disc disease noted.
IMPRESSION: 1. Examination is positive for large low-attenuation tubular
structure in the right lower quadrant of the abdomen associated with
the cecum. Imaging findings are compatible with Mucocele of the
appendix. Mucinous neoplasm of the appendix is not excluded.
2. Left kidney cyst.
3.  Aortic Atherosclerosis ([id number]).

## 2018-04-17 DIAGNOSIS — R0989 Other specified symptoms and signs involving the circulatory and respiratory systems: Secondary | ICD-10-CM | POA: Diagnosis not present

## 2018-04-18 ENCOUNTER — Emergency Department (HOSPITAL_COMMUNITY)
Admission: EM | Admit: 2018-04-18 | Discharge: 2018-04-18 | Disposition: A | Discharge status: Home / Self Care | Payer: Medicare Other | Attending: Emergency Medicine | Admitting: Emergency Medicine

## 2018-04-18 ENCOUNTER — Other Ambulatory Visit: Payer: Self-pay

## 2018-04-18 ENCOUNTER — Encounter (HOSPITAL_COMMUNITY): Payer: Self-pay | Admitting: Emergency Medicine

## 2018-04-18 DIAGNOSIS — Z79899 Other long term (current) drug therapy: Secondary | ICD-10-CM | POA: Diagnosis not present

## 2018-04-18 DIAGNOSIS — Z85038 Personal history of other malignant neoplasm of large intestine: Secondary | ICD-10-CM | POA: Diagnosis not present

## 2018-04-18 DIAGNOSIS — I10 Essential (primary) hypertension: Secondary | ICD-10-CM | POA: Diagnosis not present

## 2018-04-18 DIAGNOSIS — Z87891 Personal history of nicotine dependence: Secondary | ICD-10-CM | POA: Diagnosis not present

## 2018-04-18 LAB — CBC WITH DIFFERENTIAL/PLATELET
Abs Immature Granulocytes: 0 10*3/uL (ref 0.0–0.1)
BASOS PCT: 1 %
Basophils Absolute: 0 10*3/uL (ref 0.0–0.1)
EOS ABS: 0.1 10*3/uL (ref 0.0–0.7)
Eosinophils Relative: 2 %
HEMATOCRIT: 44.3 % (ref 36.0–46.0)
Hemoglobin: 15.1 g/dL — ABNORMAL HIGH (ref 12.0–15.0)
IMMATURE GRANULOCYTES: 0 %
LYMPHS ABS: 1.1 10*3/uL (ref 0.7–4.0)
Lymphocytes Relative: 19 %
MCH: 30.5 pg (ref 26.0–34.0)
MCHC: 34.1 g/dL (ref 30.0–36.0)
MCV: 89.5 fL (ref 78.0–100.0)
MONO ABS: 0.7 10*3/uL (ref 0.1–1.0)
MONOS PCT: 12 %
NEUTROS PCT: 66 %
Neutro Abs: 3.9 10*3/uL (ref 1.7–7.7)
PLATELETS: 324 10*3/uL (ref 150–400)
RBC: 4.95 MIL/uL (ref 3.87–5.11)
RDW: 11.9 % (ref 11.5–15.5)
WBC: 5.8 10*3/uL (ref 4.0–10.5)

## 2018-04-18 LAB — BASIC METABOLIC PANEL
ANION GAP: 9 (ref 5–15)
BUN: 9 mg/dL (ref 8–23)
CO2: 25 mmol/L (ref 22–32)
Calcium: 8.7 mg/dL — ABNORMAL LOW (ref 8.9–10.3)
Chloride: 88 mmol/L — ABNORMAL LOW (ref 98–111)
Creatinine, Ser: 0.85 mg/dL (ref 0.44–1.00)
GFR calc Af Amer: >60 mL/min (ref >60)
GFR calc non Af Amer: >60 mL/min (ref >60)
GLUCOSE: 113 mg/dL — AB (ref 70–99)
POTASSIUM: 4.6 mmol/L (ref 3.5–5.1)
Sodium: 122 mmol/L — ABNORMAL LOW (ref 135–145)

## 2018-04-18 NOTE — ED Notes (Signed)
Pt ambulated back to room with a steady gait.

## 2018-04-18 NOTE — ED Triage Notes (Addendum)
Patient to ED c/o blood pressure spiking the last 5 days. She states she saw her PCP yesterday and was started on propranolol, which she took this morning and reports feeling worse. Patient states her BP has been over 200 off and on this past week. Denies h/a, speech or vision changes, dizziness. Resp e/u, skin warm/dry.

## 2018-04-18 NOTE — Discharge Instructions (Addendum)
Stop Propanolol. Increase Cozaar to 50 mg every AM. Your sodium was low at 122. Increase salt intake.  Call Dr. Maudie Mercury regarding low Sodium to schedule lab recheck. If you become weak, dizzy, light headed, confused, muscle cramps, please recheck here.

## 2018-04-18 NOTE — ED Provider Notes (Signed)
[facility] Provider Note   CSN: [id number] Arrival date & time: 04/18/18  1535     History   Chief Complaint Chief Complaint  Patient presents with  . Hypertension    HPI [name] is a 77 y.o. female.CC: High blood pressure  HPI: 77 year old female.  History of hypertension.  Previously well controlled on low-dose Cozaar.  Follows with Dr. [name].  Last 5 days has had multiple readings rather higher than normal for her.  As high as 210/110.  She does admit that she checks her blood pressure often.  Husband states that she checks it "all the time".  Also admits there is a fair amount of anxiety when her pressures are high.  Seen by the nurse practitioner Dr. Julianne Rice office yesterday.  Was given a prescription for propanolol.  I presume this was with 2 endpoints in mind, of treating her blood pressure and perhaps help limiting some of her anxiety.  Blood pressure is up over 200 again today despite taking the propanolol.  Does admit again that she had anxious.  She took a half dose of her Ativan and symptoms including blood pressure having improved.  Past Medical History:  Diagnosis Date  . Arthritis    oa  . Baker's cyst    behind left knee  . Constipation   . Hemorrhoids   . Hypertension   . MVP (mitral valve prolapse)   . Neoplasm    of appendix  . Rosacea     Patient Active Problem List   Diagnosis Date Noted  . 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 Gastroesophageal reflux disease 06/29/2017  . Hypercholesterolemia 06/29/2017  . Hypertensive disorder 06/29/2017  . Mitral valve disorder 06/29/2017  . Tear of LCL (lateral collateral ligament) of knee, left, subsequent encounter 08/09/2016  . Degenerative arthritis of left knee 07/06/2016  . Degenerative arthritis of right knee 02/25/2016  . Piriformis syndrome of right side 06/24/2015  . Primary localized osteoarthrosis, lower leg 06/13/2014  . Baker's  cyst of knee 05/20/2014    Past Surgical History:  Procedure Laterality Date  . AUGMENTATION MAMMAPLASTY Bilateral 1990   saline  . CARDIAC CATHETERIZATION  17years ago   small amount of blockage  . CATRACTS Bilateral   . COLONOSCOPY  2006 and oct 2018  . ILEOCECETOMY N/A 08/17/2017   Procedure: OPEN ILEOCECETOMY;  Surgeon: Armandina Gemma, MD;  Location: [facility];  Service: General;  Laterality: N/A;  [facility]     OB History   None      Home Medications    Prior to Admission medications   Medication Sig Start Date End Date Taking? Authorizing Provider  acetaminophen (TYLENOL) 500 MG tablet Take 1,000 mg by mouth every 6 (six) hours as needed.    [provider]  Acetaminophen-Aspirin Buffered (EXCEDRIN BACK & BODY) 250-250 MG tablet Take 0.05 tablets by mouth 2 (two) times daily.    [provider]  doxycycline (VIBRA-TABS) 100 MG tablet Take 50 mg by mouth 2 (two) times daily.  07/31/17   [provider]  gabapentin (NEURONTIN) 100 MG capsule Take 2 capsules (200 mg total) by mouth at bedtime. Patient taking differently: Take 200 mg by mouth daily as needed (for pain).  07/06/16   [name], DO  LORazepam (ATIVAN) 0.5 MG tablet Take 0.5 mg by mouth 2 (two) times daily as needed for anxiety. 07/14/17   [provider]  losartan (COZAAR) 25 MG  tablet Take 25 mg by mouth at bedtime.  07/30/14   [provider]  metroNIDAZOLE (METROGEL) 0.75 % gel Apply 1 application topically as needed.  07/31/17   [provider]  traMADol (ULTRAM) 50 MG tablet Take 1-2 tablets (50-100 mg total) by mouth every 6 (six) hours as needed for moderate pain. 08/20/17   Jovita Kussmaul, MD    Family History Family History  Problem Relation Age of Onset  . Colon cancer Cousin        33's maybe    Social History Social History   Tobacco Use  . Smoking status: Former Smoker    Packs/day: 0.50    Years: 10.00    Pack years: 5.00    Types:  Cigarettes  . Smokeless tobacco: Never Used  . Tobacco comment: quit 35 yrs ago  Substance Use Topics  . Alcohol use: Yes    Alcohol/week: 4.2 oz    Types: 7 Glasses of wine per week    Comment: 1 glass red wine  per night  . Drug use: No     Allergies   Statins   Review of Systems Review of Systems  Constitutional: Negative for appetite change, chills, diaphoresis, fatigue and fever.  HENT: Negative for mouth sores, sore throat and trouble swallowing.   Eyes: Negative for visual disturbance.  Respiratory: Negative for cough, chest tightness, shortness of breath and wheezing.   Cardiovascular: Negative for chest pain.  Gastrointestinal: Negative for abdominal distention, abdominal pain, diarrhea, nausea and vomiting.  Endocrine: Negative for polydipsia, polyphagia and polyuria.  Genitourinary: Negative for dysuria, frequency and hematuria.  Musculoskeletal: Negative for gait problem.  Skin: Negative for color change, pallor and rash.  Neurological: Negative for dizziness, syncope, light-headedness and headaches.  Hematological: Does not bruise/bleed easily.  Psychiatric/Behavioral: Negative for behavioral problems and confusion. The patient is nervous/anxious.      Physical Exam Updated Vital Signs BP (!) 159/94   Pulse (!) 54   Temp (!) 97.3 F (36.3 C) (Oral)   Resp 15   SpO2 100%   Physical Exam  Constitutional: She is oriented to person, place, and time. She appears well-developed and well-nourished. No distress.  HENT:  Head: Normocephalic.  Eyes: Pupils are equal, round, and reactive to light. Conjunctivae are normal. No scleral icterus.  Neck: Normal range of motion. Neck supple. No thyromegaly present.  Cardiovascular: Normal rate and regular rhythm. Exam reveals no gallop and no friction rub.  No murmur heard. Pulmonary/Chest: Effort normal and breath sounds normal. No respiratory distress. She has no wheezes. She has no rales.  Abdominal: Soft. Bowel  sounds are normal. She exhibits no distension. There is no tenderness. There is no rebound.  Musculoskeletal: Normal range of motion.  Neurological: She is alert and oriented to person, place, and time.  Skin: Skin is warm and dry. No rash noted.  Psychiatric: She has a normal mood and affect. Her behavior is normal.     ED Treatments / Results  Labs (all labs ordered are listed, but only abnormal results are displayed) Labs Reviewed  CBC WITH DIFFERENTIAL/PLATELET - Abnormal; Notable for the following components:      Result Value   Hemoglobin 15.1 (*)    All other components within normal limits  BASIC METABOLIC PANEL - Abnormal; Notable for the following components:   Sodium 122 (*)    Chloride 88 (*)    Glucose, Bld 113 (*)    Calcium 8.7 (*)    All other  components within normal limits    EKG None  Radiology No results found.  Procedures Procedures (including critical care time)  Medications Ordered in ED Medications - No data to display   Initial Impression / Assessment and Plan / ED Course  I have reviewed the triage vital signs and the nursing notes.  Pertinent labs & imaging results that were available during my care of the patient were reviewed by me and considered in my medical decision making (see chart for details).    Recheck pressures here down to 169/94.  I have asked her to discontinue her propanolol.  We will increase her Cozaar to 50/day.  Renal function is normal.  Her sodium and chloride are slightly low.  This is perhaps secondary to her ARB.  I have asked her to recheck if she becomes symptomatic including dizziness weakness confusion muscle aches or cramps.  I asked her to call Dr. Maudie Mercury to discuss the changes have suggested tonight.  She will take 50 mg of Cozaar every morning.  Call to reschedule lab draw to recheck sodium.  Final Clinical Impressions(s) / ED Diagnoses   Final diagnoses:  Hypertension, unspecified type    ED Discharge Orders     None       Tanna Furry, MD 04/18/18 [phone]

## 2018-04-18 NOTE — ED Provider Notes (Signed)
Patient placed in Quick Look pathway, seen and evaluated   Chief Complaint: HTN  HPI:   Patient with hx of htn. Blood pressure has been going up and systolic pressure >200 lst night. Took propranolol swhich made her "feel terrible" and lorazepam .  ROS: htn  (one)  Physical Exam:   Gen: No distress  Neuro: Awake and Alert  Skin: Warm    Focused Exam: RRR   Initiation of care has begun. The patient has been counseled on the process, plan, and necessity for staying for the completion/evaluation, and the remainder of the medical screening examination    , , PA-C 04/18/18 1616    Mesner, Jason, MD 04/19/18 1629  

## 2018-04-18 NOTE — ED Notes (Signed)
Signature pad unavailable at time of pt discharge, pt denied any further requests.

## 2018-04-25 DIAGNOSIS — I10 Essential (primary) hypertension: Secondary | ICD-10-CM | POA: Diagnosis not present

## 2018-04-25 DIAGNOSIS — E039 Hypothyroidism, unspecified: Secondary | ICD-10-CM | POA: Diagnosis not present

## 2018-05-14 DIAGNOSIS — E785 Hyperlipidemia, unspecified: Secondary | ICD-10-CM | POA: Diagnosis not present

## 2018-05-14 DIAGNOSIS — E039 Hypothyroidism, unspecified: Secondary | ICD-10-CM | POA: Diagnosis not present

## 2018-05-14 DIAGNOSIS — I700 Atherosclerosis of aorta: Secondary | ICD-10-CM | POA: Diagnosis not present

## 2018-05-14 DIAGNOSIS — I10 Essential (primary) hypertension: Secondary | ICD-10-CM | POA: Diagnosis not present

## 2018-06-04 DIAGNOSIS — X32XXXD Exposure to sunlight, subsequent encounter: Secondary | ICD-10-CM | POA: Diagnosis not present

## 2018-06-04 DIAGNOSIS — L570 Actinic keratosis: Secondary | ICD-10-CM | POA: Diagnosis not present

## 2018-06-04 DIAGNOSIS — L820 Inflamed seborrheic keratosis: Secondary | ICD-10-CM | POA: Diagnosis not present

## 2018-08-06 DIAGNOSIS — E785 Hyperlipidemia, unspecified: Secondary | ICD-10-CM | POA: Diagnosis not present

## 2018-08-10 DIAGNOSIS — I10 Essential (primary) hypertension: Secondary | ICD-10-CM | POA: Diagnosis not present

## 2018-08-10 DIAGNOSIS — I2510 Atherosclerotic heart disease of native coronary artery without angina pectoris: Secondary | ICD-10-CM | POA: Diagnosis not present

## 2018-08-10 DIAGNOSIS — E7800 Pure hypercholesterolemia, unspecified: Secondary | ICD-10-CM | POA: Diagnosis not present

## 2018-08-10 DIAGNOSIS — Z23 Encounter for immunization: Secondary | ICD-10-CM | POA: Diagnosis not present

## 2018-08-22 ENCOUNTER — Other Ambulatory Visit: Payer: Self-pay | Admitting: Obstetrics and Gynecology

## 2018-08-22 DIAGNOSIS — Z1231 Encounter for screening mammogram for malignant neoplasm of breast: Secondary | ICD-10-CM

## 2018-09-16 NOTE — Progress Notes (Signed)
[name] [address], [name] [address] Phone: [phone] Subjective:    I Abigail [name] am serving as a Education administrator for Dr. Hulan [name].     CC: Knee pain and swelling follow-up  [id number]  Abigail [name] is a 77 y.o. female coming in with complaint of left knee pain. Knee needs to be drained. Toes on the left foot are going numb. Feels better in the morning. Worse before bed at night. Sometimes has lower back pain with flexion. She is a side sleeper. She is concerned that it is secondary to more the left knee.  May be compensating.  Known arthritic changes of multiple joints     Past Medical History:  Diagnosis Date  . Arthritis    oa  . Baker's cyst    behind left knee  . Constipation   . Hemorrhoids   . Hypertension   . MVP (mitral valve prolapse)   . Neoplasm    of appendix  . Rosacea    Past Surgical History:  Procedure Laterality Date  . AUGMENTATION MAMMAPLASTY Bilateral 1990   saline  . CARDIAC CATHETERIZATION  17years ago   small amount of blockage  . CATRACTS Bilateral   . COLONOSCOPY  2006 and oct 2018  . ILEOCECETOMY N/A 08/17/2017   Procedure: OPEN ILEOCECETOMY;  Surgeon: [name] Gemma, MD;  Location: [facility];  Service: General;  Laterality: N/A;  ERAS PATHWAY   Social History   Socioeconomic History  . Marital status: Married    Spouse name: Not on file  . Number of children: Not on file  . Years of education: Not on file  . Highest education level: Not on file  Occupational History  . Not on file  Social Needs  . Financial resource strain: Not on file  . Food insecurity:    Worry: Not on file    Inability: Not on file  . Transportation needs:    Medical: Not on file    Non-medical: Not on file  Tobacco Use  . Smoking status: Former Smoker    Packs/day: 0.50    Years: 10.00    Pack years: 5.00    Types: Cigarettes  . Smokeless tobacco: Never Used  . Tobacco comment: quit 35 yrs ago  Substance  and Sexual Activity  . Alcohol use: Yes    Alcohol/week: 7.0 standard drinks    Types: 7 Glasses of wine per week    Comment: 1 glass red wine  per night  . Drug use: No  . Sexual activity: Not on file  Lifestyle  . Physical activity:    Days per week: Not on file    Minutes per session: Not on file  . Stress: Not on file  Relationships  . Social connections:    Talks on phone: Not on file    Gets together: Not on file    Attends religious service: Not on file    Active member of club or organization: Not on file    Attends meetings of clubs or organizations: Not on file    Relationship status: Not on file  Other Topics Concern  . Not on file  Social History Narrative  . Not on file   Allergies  Allergen Reactions  . Statins Diarrhea and Nausea And Vomiting   Family History  Problem Relation Age of Onset  . Colon cancer Cousin        60's maybe     Current Outpatient  Medications (Cardiovascular):  .  losartan (COZAAR) 25 MG tablet, Take 25 mg by mouth at bedtime.    Current Outpatient Medications (Analgesics):  .  acetaminophen (TYLENOL) 500 MG tablet, Take 1,000 mg by mouth every 6 (six) hours as needed. .  Acetaminophen-Aspirin Buffered (EXCEDRIN BACK & BODY) 250-250 MG tablet, Take 0.05 tablets by mouth 2 (two) times daily. .  traMADol (ULTRAM) 50 MG tablet, Take 1-2 tablets (50-100 mg total) by mouth every 6 (six) hours as needed for moderate pain.   Current Outpatient Medications (Other):  .  doxycycline (VIBRA-TABS) 100 MG tablet, Take 50 mg by mouth 2 (two) times daily.  Marland Kitchen  gabapentin (NEURONTIN) 100 MG capsule, Take 2 capsules (200 mg total) by mouth at bedtime. (Patient taking differently: Take 200 mg by mouth daily as needed (for pain). ) .  LORazepam (ATIVAN) 0.5 MG tablet, Take 0.5 mg by mouth 2 (two) times daily as needed for anxiety. .  metroNIDAZOLE (METROGEL) 0.75 % gel, Apply 1 application topically as needed.     Past medical history, social,  surgical and family history all reviewed in electronic medical record.  No pertanent information unless stated regarding to the chief complaint.   Review of Systems:  No headache, visual changes, nausea, vomiting, diarrhea, constipation, dizziness, abdominal pain, skin rash, fevers, chills, night sweats, weight loss, swollen lymph nodes, body aches, chest pain, shortness of breath, mood changes.  Positive muscle aches and joint swelling  Objective  Blood pressure 104/80, pulse 69, height 5\' 6"  (1.676 m), weight 117 lb (53.1 kg), SpO2 95 %.   General: No apparent distress alert and oriented x3 mood and affect normal, dressed appropriately.  HEENT: Pupils equal, extraocular movements intact  Respiratory: Patient's speak in full sentences and does not appear short of breath  Cardiovascular: Trace lower extremity edema, non tender, no erythema  Skin: Warm dry intact with no signs of infection or rash on extremities or on axial skeleton.  Abdomen: Soft nontender  Neuro: Cranial nerves II through XII are intact, neurovascularly intact in all extremities with 2+ DTRs and 2+ pulses.  Lymph: No lymphadenopathy of posterior or anterior cervical chain or axillae bilaterally.  Gait antalgic MSK:  tender with full range of motion and good stability and symmetric strength and tone of shoulders, elbows, wrist, hip, and ankles bilaterally.  Patient does show that there is significant fullness in the popliteal area again.  Mild instability.  Varus deformity noted.  Patient does have crepitus with range of motion. Contralateral knee also shows arthritic changes but no instability  Procedure: Real-time Ultrasound Guided Injection of left knee Device: GE Logiq Q7 Ultrasound guided injection is preferred based studies that show increased duration, increased effect, greater accuracy, decreased procedural pain, increased response rate, and decreased cost with ultrasound guided versus blind injection.  Verbal  informed consent obtained.  Time-out conducted.  Noted no overlying erythema, induration, or other signs of local infection.  Skin prepped in a sterile fashion.  Local anesthesia: Topical Ethyl chloride.  With sterile technique and under real time ultrasound guidance: With a 22-gauge 2 inch needle patient was injected with 4 cc of 0.5% Marcaine a and then aspirated 50 cc of straw-colored fluid from the popliteal fossa then injected nd 1 cc of Kenalog 40 mg/dL. This was from a posterior approach.  Completed without difficulty  Pain immediately resolved suggesting accurate placement of the medication.  Advised to call if fevers/chills, erythema, induration, drainage, or persistent bleeding.  Images permanently stored and available  for review in the ultrasound unit.  Impression: Technically successful ultrasound guided injection.   Impression and Recommendations:     This case required medical decision making of moderate complexity. The above documentation has been reviewed and is accurate and complete [name], DO       Note: This dictation was prepared with Dragon dictation along with smaller phrase technology. Any transcriptional errors that result from this process are unintentional.

## 2018-09-17 ENCOUNTER — Ambulatory Visit: Payer: Self-pay

## 2018-09-17 ENCOUNTER — Ambulatory Visit: Payer: Medicare Other | Admitting: Family Medicine

## 2018-09-17 ENCOUNTER — Encounter: Payer: Self-pay | Admitting: Family Medicine

## 2018-09-17 VITALS — BP 104/80 | HR 69 | Ht 66.0 in | Wt 117.0 lb

## 2018-09-17 DIAGNOSIS — M25562 Pain in left knee: Secondary | ICD-10-CM | POA: Diagnosis not present

## 2018-09-17 DIAGNOSIS — M1711 Unilateral primary osteoarthritis, right knee: Secondary | ICD-10-CM | POA: Diagnosis not present

## 2018-09-17 DIAGNOSIS — G8929 Other chronic pain: Secondary | ICD-10-CM | POA: Diagnosis not present

## 2018-09-17 DIAGNOSIS — Z96652 Presence of left artificial knee joint: Secondary | ICD-10-CM | POA: Diagnosis not present

## 2018-09-17 DIAGNOSIS — M1712 Unilateral primary osteoarthritis, left knee: Secondary | ICD-10-CM | POA: Diagnosis not present

## 2018-09-17 DIAGNOSIS — M7122 Synovial cyst of popliteal space [Baker], left knee: Secondary | ICD-10-CM

## 2018-09-17 NOTE — Patient Instructions (Signed)
You are great  Stay active Ice is your friend You know the drill  See me when you need me

## 2018-09-17 NOTE — Assessment & Plan Note (Signed)
Aspirated the knee again today.  Discussed icing regimen and home exercises.  Discussed which activities to do which wants to avoid.  Discussed topical anti-inflammatories.  Discussed compression.  May need repeat again.  Follow-up again in 4 to 8 weeks

## 2018-09-28 ENCOUNTER — Other Ambulatory Visit: Payer: Self-pay | Admitting: Family Medicine

## 2018-10-03 ENCOUNTER — Ambulatory Visit
Admission: RE | Admit: 2018-10-03 | Discharge: 2018-10-03 | Disposition: A | Discharge status: Home / Self Care | Payer: Medicare Other | Source: Ambulatory Visit | Attending: Obstetrics and Gynecology | Admitting: Obstetrics and Gynecology

## 2018-10-03 DIAGNOSIS — Z1231 Encounter for screening mammogram for malignant neoplasm of breast: Secondary | ICD-10-CM

## 2018-10-07 ENCOUNTER — Encounter

## 2018-11-20 DIAGNOSIS — E039 Hypothyroidism, unspecified: Secondary | ICD-10-CM | POA: Diagnosis not present

## 2018-11-20 DIAGNOSIS — E7800 Pure hypercholesterolemia, unspecified: Secondary | ICD-10-CM | POA: Diagnosis not present

## 2018-11-20 DIAGNOSIS — I10 Essential (primary) hypertension: Secondary | ICD-10-CM | POA: Diagnosis not present

## 2018-11-27 DIAGNOSIS — I10 Essential (primary) hypertension: Secondary | ICD-10-CM | POA: Diagnosis not present

## 2018-11-27 DIAGNOSIS — I2510 Atherosclerotic heart disease of native coronary artery without angina pectoris: Secondary | ICD-10-CM | POA: Diagnosis not present

## 2018-11-27 DIAGNOSIS — Z0000 Encounter for general adult medical examination without abnormal findings: Secondary | ICD-10-CM | POA: Diagnosis not present

## 2018-11-27 DIAGNOSIS — R946 Abnormal results of thyroid function studies: Secondary | ICD-10-CM | POA: Diagnosis not present

## 2019-01-11 DIAGNOSIS — H0012 Chalazion right lower eyelid: Secondary | ICD-10-CM | POA: Diagnosis not present

## 2019-01-11 DIAGNOSIS — H52201 Unspecified astigmatism, right eye: Secondary | ICD-10-CM | POA: Diagnosis not present

## 2019-01-11 DIAGNOSIS — Z961 Presence of intraocular lens: Secondary | ICD-10-CM | POA: Diagnosis not present

## 2019-01-29 ENCOUNTER — Ambulatory Visit: Payer: PRIVATE HEALTH INSURANCE | Admitting: Family Medicine

## 2019-04-08 ENCOUNTER — Ambulatory Visit (INDEPENDENT_AMBULATORY_CARE_PROVIDER_SITE_OTHER): Payer: Medicare Other | Admitting: Family Medicine

## 2019-04-08 ENCOUNTER — Encounter: Payer: Self-pay | Admitting: Family Medicine

## 2019-04-08 ENCOUNTER — Other Ambulatory Visit: Payer: Self-pay

## 2019-04-08 ENCOUNTER — Ambulatory Visit: Payer: Self-pay

## 2019-04-08 VITALS — BP 140/66 | HR 65 | Ht 66.0 in | Wt 124.0 lb

## 2019-04-08 DIAGNOSIS — M25562 Pain in left knee: Secondary | ICD-10-CM | POA: Diagnosis not present

## 2019-04-08 DIAGNOSIS — M1712 Unilateral primary osteoarthritis, left knee: Secondary | ICD-10-CM | POA: Diagnosis not present

## 2019-04-08 DIAGNOSIS — G8929 Other chronic pain: Secondary | ICD-10-CM

## 2019-04-08 DIAGNOSIS — M1711 Unilateral primary osteoarthritis, right knee: Secondary | ICD-10-CM

## 2019-04-08 NOTE — Progress Notes (Signed)
[name] [place], [place] [address] Phone: [phone] Subjective:   I [name] am serving as a Education administrator for Dr. Hulan Saas.   CC: Knee pain follow-up  [id number]  Abigail [name] is a 78 y.o. female coming in with complaint of knee pain. Draining baker cyst.  Patient has had significant arthritic changes.  Patient has not been swelling recently.  Patient is also having unfortunately with the right knee giving more trouble.  Feels like it is more unstable.      Past Medical History:  Diagnosis Date  . Arthritis    oa  . Baker's cyst    behind left knee  . Constipation   . Hemorrhoids   . Hypertension   . MVP (mitral valve prolapse)   . Neoplasm    of appendix  . Rosacea    Past Surgical History:  Procedure Laterality Date  . AUGMENTATION MAMMAPLASTY Bilateral 1990   saline  . CARDIAC CATHETERIZATION  17years ago   small amount of blockage  . CATRACTS Bilateral   . COLONOSCOPY  2006 and oct 2018  . ILEOCECETOMY N/A 08/17/2017   Procedure: OPEN ILEOCECETOMY;  Surgeon: Armandina Gemma, MD;  Location: [facility];  Service: General;  Laterality: N/A;  ERAS PATHWAY   Social History   Socioeconomic History  . Marital status: Married    Spouse name: Not on file  . Number of children: Not on file  . Years of education: Not on file  . Highest education level: Not on file  Occupational History  . Not on file  Social Needs  . Financial resource strain: Not on file  . Food insecurity    Worry: Not on file    Inability: Not on file  . Transportation needs    Medical: Not on file    Non-medical: Not on file  Tobacco Use  . Smoking status: Former Smoker    Packs/day: 0.50    Years: 10.00    Pack years: 5.00    Types: Cigarettes  . Smokeless tobacco: Never Used  . Tobacco comment: quit 35 yrs ago  Substance and Sexual Activity  . Alcohol use: Yes    Alcohol/week: 7.0 standard drinks    Types: 7 Glasses of wine per week     Comment: 1 glass red wine  per night  . Drug use: No  . Sexual activity: Not on file  Lifestyle  . Physical activity    Days per week: Not on file    Minutes per session: Not on file  . Stress: Not on file  Relationships  . Social Herbalist on phone: Not on file    Gets together: Not on file    Attends religious service: Not on file    Active member of club or organization: Not on file    Attends meetings of clubs or organizations: Not on file    Relationship status: Not on file  Other Topics Concern  . Not on file  Social History Narrative  . Not on file   Allergies  Allergen Reactions  . Statins Diarrhea and Nausea And Vomiting   Family History  Problem Relation Age of Onset  . Colon cancer Cousin        60's maybe     Current Outpatient Medications (Cardiovascular):  .  losartan (COZAAR) 25 MG tablet, Take 25 mg by mouth at bedtime.    Current Outpatient Medications (Analgesics):  .  acetaminophen (TYLENOL) 500 MG tablet, Take 1,000 mg by mouth every 6 (six) hours as needed. .  Acetaminophen-Aspirin Buffered (EXCEDRIN BACK & BODY) 250-250 MG tablet, Take 0.05 tablets by mouth 2 (two) times daily. .  traMADol (ULTRAM) 50 MG tablet, Take 1-2 tablets (50-100 mg total) by mouth every 6 (six) hours as needed for moderate pain.   Current Outpatient Medications (Other):  .  doxycycline (VIBRA-TABS) 100 MG tablet, Take 50 mg by mouth 2 (two) times daily.  Marland Kitchen  gabapentin (NEURONTIN) 100 MG capsule, TAKE 2 CAPSULES AT BEDTIME. .  LORazepam (ATIVAN) 0.5 MG tablet, Take 0.5 mg by mouth 2 (two) times daily as needed for anxiety. .  metroNIDAZOLE (METROGEL) 0.75 % gel, Apply 1 application topically as needed.     Past medical history, social, surgical and family history all reviewed in electronic medical record.  No pertanent information unless stated regarding to the chief complaint.   Review of Systems:  No headache, visual changes, nausea, vomiting,  diarrhea, constipation, dizziness, abdominal pain, skin rash, fevers, chills, night sweats, weight loss, swollen lymph nodes, body aches, joint swelling,  chest pain, shortness of breath, mood changes.  Positive muscle aches  Objective  Blood pressure 140/66, pulse 65, height 5\' 6"  (1.676 m), weight 124 lb (56.2 kg).    General: No apparent distress alert and oriented x3 mood and affect normal, dressed appropriately.  HEENT: Pupils equal, extraocular movements intact  Respiratory: Patient's speak in full sentences and does not appear short of breath  Cardiovascular: No lower extremity edema, non tender, no erythema  Skin: Warm dry intact with no signs of infection or rash on extremities or on axial skeleton.  Abdomen: Soft nontender  Neuro: Cranial nerves II through XII are intact, neurovascularly intact in all extremities with 2+ DTRs and 2+ pulses.  Lymph: No lymphadenopathy of posterior or anterior cervical chain or axillae bilaterally.  Gait antalgic MSK:  tender with full range of motion and good stability and symmetric strength and tone of shoulders, elbows, wrist, hip, and ankles bilaterally.  Left knee exam shows significant arthritic changes.  Patient does have a large Baker's cyst on the posterior aspect of the line.  Mild instability noted as well.  Patient has also on the right knee.  No instability noted.  Varus deformity noted.  Patient does have abnormal thigh to calf ratio of the right knee.  Near full range of motion.  Procedure: Real-time Ultrasound Guided Injection of left knee Device: GE Logiq Q7 Ultrasound guided injection is preferred based studies that show increased duration, increased effect, greater accuracy, decreased procedural pain, increased response rate, and decreased cost with ultrasound guided versus blind injection.  Verbal informed consent obtained.  Time-out conducted.  Noted no overlying erythema, induration, or other signs of local infection.  Skin  prepped in a sterile fashion.  Local anesthesia: Topical Ethyl chloride.  With sterile technique and under real time ultrasound guidance: With a 22-gauge 2 inch needle patient was injected with 4 cc of 0.5% Marcaine and 1 cc of Kenalog 40 mg/dL. This was from a posterior approach.  Completed without difficulty  Pain immediately resolved suggesting accurate placement of the medication.  Advised to call if fevers/chills, erythema, induration, drainage, or persistent bleeding.  Images permanently stored and available for review in the ultrasound unit.  Impression: Technically successful ultrasound guided injection.  After informed written and verbal consent, patient was seated on exam table. Right knee was prepped with alcohol swab and utilizing anterolateral approach, patient's  right knee space was injected with 4:1  marcaine 0.5%: Kenalog 40mg /dL. Patient tolerated the procedure well without immediate complications.   Impression and Recommendations:     This case required medical decision making of moderate complexity. The above documentation has been reviewed and is accurate and complete [name], DO       Note: This dictation was prepared with Dragon dictation along with smaller phrase technology. Any transcriptional errors that result from this process are unintentional.

## 2019-04-08 NOTE — Patient Instructions (Signed)
You know the drill  Ice is your friend Possible compression sleeve daily for 2 weeks.  See me again in 10 weeks if need me or can see me earlier if we need the gel injections

## 2019-04-08 NOTE — Assessment & Plan Note (Signed)
Patient given injection today.  Last one was nearly 3 years ago.  Likely will do well.  Discussed that abnormal thigh to calf ratio and the instability of the knee patient continues in a custom brace.  Patient will consider it.  Follow-up again noted weeks

## 2019-04-08 NOTE — Assessment & Plan Note (Signed)
Aspirated again today.  Tolerated procedure well.  Discussed compression and home exercises.  Discussed the possibility of viscosupplementation.  Patient will consider this follow-up again in 4 to 8 weeks

## 2019-06-17 ENCOUNTER — Ambulatory Visit: Payer: PRIVATE HEALTH INSURANCE | Admitting: Family Medicine

## 2019-07-02 DIAGNOSIS — E039 Hypothyroidism, unspecified: Secondary | ICD-10-CM | POA: Diagnosis not present

## 2019-07-02 DIAGNOSIS — E7800 Pure hypercholesterolemia, unspecified: Secondary | ICD-10-CM | POA: Diagnosis not present

## 2019-07-02 DIAGNOSIS — I10 Essential (primary) hypertension: Secondary | ICD-10-CM | POA: Diagnosis not present

## 2019-07-09 DIAGNOSIS — I10 Essential (primary) hypertension: Secondary | ICD-10-CM | POA: Diagnosis not present

## 2019-07-09 DIAGNOSIS — E039 Hypothyroidism, unspecified: Secondary | ICD-10-CM | POA: Diagnosis not present

## 2019-07-09 DIAGNOSIS — E785 Hyperlipidemia, unspecified: Secondary | ICD-10-CM | POA: Diagnosis not present

## 2019-07-28 ENCOUNTER — Other Ambulatory Visit: Payer: Self-pay | Admitting: Family Medicine

## 2019-08-14 ENCOUNTER — Other Ambulatory Visit: Payer: Self-pay

## 2019-08-14 NOTE — Patient Outreach (Signed)
[facility]) Care Management  08/14/2019  [facility] HOST [birth year] [id number]   Medication Adherence call to Mrs. Sweta Maida HIPPA Compliant Voice message left with a call back number. Mrs. Petroff is showing past due on Pravastatin 10 mg under [facility].   [place] Management Direct Dial [phone]  Fax [phone] [email or web address]

## 2019-10-01 ENCOUNTER — Ambulatory Visit: Payer: Self-pay

## 2019-10-01 ENCOUNTER — Ambulatory Visit (INDEPENDENT_AMBULATORY_CARE_PROVIDER_SITE_OTHER): Payer: PRIVATE HEALTH INSURANCE | Admitting: Family Medicine

## 2019-10-01 ENCOUNTER — Encounter: Payer: Self-pay | Admitting: Family Medicine

## 2019-10-01 ENCOUNTER — Other Ambulatory Visit: Payer: Self-pay

## 2019-10-01 VITALS — BP 140/70 | HR 64 | Ht 66.0 in | Wt 125.0 lb

## 2019-10-01 DIAGNOSIS — M25562 Pain in left knee: Secondary | ICD-10-CM | POA: Diagnosis not present

## 2019-10-01 DIAGNOSIS — G8929 Other chronic pain: Secondary | ICD-10-CM | POA: Diagnosis not present

## 2019-10-01 DIAGNOSIS — M7122 Synovial cyst of popliteal space [Baker], left knee: Secondary | ICD-10-CM | POA: Diagnosis not present

## 2019-10-01 MED ORDER — GABAPENTIN 100 MG PO CAPS: 200.0000 mg | ORAL_CAPSULE | Freq: Every day | ORAL | 3 refills | Status: DC

## 2019-10-01 NOTE — Assessment & Plan Note (Signed)
Aspiration done today.  Discussed icing regimen and home exercise, discussed which activities to do which was to avoid.  Discussed posture and ergonomics, discussed avoiding certain activities.  Patient will have aspiration done again when needed.

## 2019-10-01 NOTE — Patient Instructions (Addendum)
  [address] 1st floor [place], [place] [address] Phone [phone]  Good to see you Happy Holidays! Gabapentin sent in to pharmacy Tart cherry extract 1200mg  at night 65 mg Iron with 500 mg vitamin C Call us if you need [place]

## 2019-10-01 NOTE — Progress Notes (Signed)
[name] [place], [place] [address] Phone: [phone] Subjective:   I Abigail [name] am serving as a Education administrator for Dr. Hulan Saas.  This visit occurred during the SARS-CoV-2 public health emergency.  Safety protocols were in place, including screening questions prior to the visit, additional usage of staff PPE, and extensive cleaning of exam room while observing appropriate contact time as indicated for disinfecting solutions.     CC: Left knee pain  [id number]   04/08/2019 Patient given injection today.  Last one was nearly 3 years ago.  Likely will do well.  Discussed that abnormal thigh to calf ratio and the instability of the knee patient continues in a custom brace.  Patient will consider it.  Follow-up again noted weeks  Aspirated again today.  Tolerated procedure well.  Discussed compression and home exercises.  Discussed the possibility of viscosupplementation.  Patient will consider this follow-up again in 4 to 8 weeks  10/01/2019 Abigail [name] is a 78 y.o. female coming in with complaint of left knee pain. Patient is here to get baker cyst drained. Piriformis on the right side painful as well. Gabapentin refill.      Past Medical History:  Diagnosis Date   Arthritis    oa   Baker's cyst    behind left knee   Constipation    Hemorrhoids    Hypertension    MVP (mitral valve prolapse)    Neoplasm    of appendix   Rosacea    Past Surgical History:  Procedure Laterality Date   AUGMENTATION MAMMAPLASTY Bilateral 1990   saline   CARDIAC CATHETERIZATION  17years ago   small amount of blockage   CATRACTS Bilateral    COLONOSCOPY  2006 and oct 2018   ILEOCECETOMY N/A 08/17/2017   Procedure: OPEN ILEOCECETOMY;  Surgeon: Armandina Gemma, MD;  Location: [facility];  Service: General;  Laterality: N/A;  ERAS PATHWAY   Social History   Socioeconomic History   Marital status: Married    Spouse name: Not on file    Number of children: Not on file   Years of education: Not on file   Highest education level: Not on file  Occupational History   Not on file  Social Needs   Financial resource strain: Not on file   Food insecurity    Worry: Not on file    Inability: Not on file   Transportation needs    Medical: Not on file    Non-medical: Not on file  Tobacco Use   Smoking status: Former Smoker    Packs/day: 0.50    Years: 10.00    Pack years: 5.00    Types: Cigarettes   Smokeless tobacco: Never Used   Tobacco comment: quit 35 yrs ago  Substance and Sexual Activity   Alcohol use: Yes    Alcohol/week: 7.0 standard drinks    Types: 7 Glasses of wine per week    Comment: 1 glass red wine  per night   Drug use: No   Sexual activity: Not on file  Lifestyle   Physical activity    Days per week: Not on file    Minutes per session: Not on file   Stress: Not on file  Relationships   Social connections    Talks on phone: Not on file    Gets together: Not on file    Attends religious service: Not on file    Active member of club or organization:  Not on file    Attends meetings of clubs or organizations: Not on file    Relationship status: Not on file  Other Topics Concern   Not on file  Social History Narrative   Not on file   Allergies  Allergen Reactions   Statins Diarrhea and Nausea And Vomiting   Family History  Problem Relation Age of Onset   Colon cancer Cousin        24's maybe     Current Outpatient Medications (Cardiovascular):    losartan (COZAAR) 25 MG tablet, Take 25 mg by mouth at bedtime.    Current Outpatient Medications (Analgesics):    acetaminophen (TYLENOL) 500 MG tablet, Take 1,000 mg by mouth every 6 (six) hours as needed.   Acetaminophen-Aspirin Buffered (EXCEDRIN BACK & BODY) 250-250 MG tablet, Take 0.05 tablets by mouth 2 (two) times daily.   traMADol (ULTRAM) 50 MG tablet, Take 1-2 tablets (50-100 mg total) by mouth every 6 (six)  hours as needed for moderate pain.   Current Outpatient Medications (Other):    doxycycline (VIBRA-TABS) 100 MG tablet, Take 50 mg by mouth 2 (two) times daily.    gabapentin (NEURONTIN) 100 MG capsule, TAKE 2 CAPSULES AT BEDTIME.   LORazepam (ATIVAN) 0.5 MG tablet, Take 0.5 mg by mouth 2 (two) times daily as needed for anxiety.   metroNIDAZOLE (METROGEL) 0.75 % gel, Apply 1 application topically as needed.    gabapentin (NEURONTIN) 100 MG capsule, Take 2 capsules (200 mg total) by mouth at bedtime.    Past medical history, social, surgical and family history all reviewed in electronic medical record.  No pertanent information unless stated regarding to the chief complaint.   Review of Systems:  No headache, visual changes, nausea, vomiting, diarrhea, constipation, dizziness, abdominal pain, skin rash, fevers, chills, night sweats, weight loss, swollen lymph nodes,, chest pain, shortness of breath, mood changes.  Positive muscle aches, joint swelling  Objective  Blood pressure 140/70, pulse 64, height 5\' 6"  (1.676 m), weight 125 lb (56.7 kg), SpO2 96 %.    General: No apparent distress alert and oriented x3 mood and affect normal, dressed appropriately.  HEENT: Pupils equal, extraocular movements intact  Respiratory: Patient's speak in full sentences and does not appear short of breath  Cardiovascular: No lower extremity edema, non tender, no erythema  Skin: Warm dry intact with no signs of infection or rash on extremities or on axial skeleton.  Abdomen: Soft nontender  Neuro: Cranial nerves II through XII are intact, neurovascularly intact in all extremities with 2+ DTRs and 2+ pulses.  Lymph: No lymphadenopathy of posterior or anterior cervical chain or axillae bilaterally.  Gait normal with good balance and coordination.  MSK:  tender with limited range of motion and good stability and symmetric strength and tone of shoulders, elbows, wrist, hip, and ankles bilaterally.   Arthritic changes of multiple joints significant scoliosis knee exam does show some abnormal thigh to calf ratio.  Significant swelling in the popliteal area with a Baker's cyst.  Lacks last 10 degrees of flexion.  Mild instability with valgus and varus force.   Procedure: Real-time Ultrasound Guided Injection of left knee Device: GE Logiq Q7 Ultrasound guided injection is preferred based studies that show increased duration, increased effect, greater accuracy, decreased procedural pain, increased response rate, and decreased cost with ultrasound guided versus blind injection.  Verbal informed consent obtained.  Time-out conducted.  Noted no overlying erythema, induration, or other signs of local infection.  Skin prepped in a  sterile fashion.  Local anesthesia: Topical Ethyl chloride.  With sterile technique and under real time ultrasound guidance: With a 22-gauge 2 inch needle patient was injected with 4 cc of 0.5% Marcaine and aspirated 65 cc of straw-colored fluid then injected 1 cc of Kenalog 40 mg/dL. This was from a posterior approach.  Completed without difficulty  Pain immediately resolved suggesting accurate placement of the medication.  Advised to call if fevers/chills, erythema, induration, drainage, or persistent bleeding.  Images permanently stored and available for review in the ultrasound unit.  Impression: Technically successful ultrasound guided injection.    Impression and Recommendations:     This case required medical decision making of moderate complexity. The above documentation has been reviewed and is accurate and complete [name], DO       Note: This dictation was prepared with Dragon dictation along with smaller phrase technology. Any transcriptional errors that result from this process are unintentional.

## 2019-11-20 ENCOUNTER — Ambulatory Visit: Payer: PRIVATE HEALTH INSURANCE

## 2019-11-29 ENCOUNTER — Ambulatory Visit: Payer: Medicare Other | Attending: Internal Medicine

## 2019-11-29 DIAGNOSIS — Z23 Encounter for immunization: Secondary | ICD-10-CM | POA: Insufficient documentation

## 2019-11-29 NOTE — Progress Notes (Signed)
° °  Covid-19 Vaccination Clinic  Name:  Abigail [name]    MRN: [id number] DOB: October 04, 1941  11/29/2019  Abigail [name] was observed post Covid-19 immunization for 15 minutes without incidence. She was provided with Vaccine Information Sheet and instruction to access the V-Safe system.   Abigail [name] was instructed to call 911 with any severe reactions post vaccine:  Difficulty breathing   Swelling of your face and throat   A fast heartbeat   A bad rash all over your body   Dizziness and weakness    Immunizations Administered    Name Date Dose VIS Date Route   Pfizer COVID-19 Vaccine 11/29/2019 10:10 AM 0.3 mL 10/04/2019 Intramuscular   Manufacturer: [place]   Lot: [id number]   [place]: [id number]

## 2019-12-07 ENCOUNTER — Ambulatory Visit: Payer: PRIVATE HEALTH INSURANCE

## 2019-12-24 ENCOUNTER — Ambulatory Visit: Payer: Medicare Other | Attending: Internal Medicine

## 2019-12-24 DIAGNOSIS — Z23 Encounter for immunization: Secondary | ICD-10-CM

## 2019-12-24 NOTE — Progress Notes (Signed)
   Covid-19 Vaccination Clinic  Name:  [name]    MRN: [id number] DOB: 10/31/1940  12/24/2019  [name] was observed post Covid-19 immunization for 15 minutes without incident. She was provided with Vaccine Information Sheet and instruction to access the V-Safe system.   [name] was instructed to call 911 with any severe reactions post vaccine: Marland Kitchen Difficulty breathing  . Swelling of face and throat  . A fast heartbeat  . A bad rash all over body  . Dizziness and weakness   Immunizations Administered    Name Date Dose VIS Date Route   Pfizer COVID-19 Vaccine 12/24/2019 10:32 AM 0.3 mL 10/04/2019 Intramuscular   Manufacturer: [place]   Lot: [id number]   [place]: [id number]

## 2020-01-06 ENCOUNTER — Other Ambulatory Visit: Payer: Self-pay

## 2020-01-06 NOTE — Patient Outreach (Signed)
[facility]) Care Management  01/06/2020  JACKELINNE PRUIETT May 17, 1941 [id number]   Medication Adherence call to Mrs. Aliaa Kennemer Hippa Identifiers Verify spoke with patient she is past due on Pravastatin 10 mg ,patient explain she has stop this medication because she was having side effects. Mrs. Gelardi is showing past due under [place].   [place] Management Direct Dial [phone]  Fax [phone] [email or web address]

## 2020-01-22 DIAGNOSIS — Z961 Presence of intraocular lens: Secondary | ICD-10-CM | POA: Diagnosis not present

## 2020-01-22 DIAGNOSIS — H5211 Myopia, right eye: Secondary | ICD-10-CM | POA: Diagnosis not present

## 2020-01-27 ENCOUNTER — Encounter: Payer: Self-pay | Admitting: Family Medicine

## 2020-01-27 ENCOUNTER — Ambulatory Visit (INDEPENDENT_AMBULATORY_CARE_PROVIDER_SITE_OTHER): Payer: Medicare Other

## 2020-01-27 ENCOUNTER — Ambulatory Visit (INDEPENDENT_AMBULATORY_CARE_PROVIDER_SITE_OTHER): Payer: Medicare Other | Admitting: Family Medicine

## 2020-01-27 ENCOUNTER — Other Ambulatory Visit: Payer: Self-pay

## 2020-01-27 VITALS — BP 140/82 | HR 63 | Ht 66.0 in | Wt 126.0 lb

## 2020-01-27 DIAGNOSIS — M7122 Synovial cyst of popliteal space [Baker], left knee: Secondary | ICD-10-CM | POA: Diagnosis not present

## 2020-01-27 DIAGNOSIS — M1712 Unilateral primary osteoarthritis, left knee: Secondary | ICD-10-CM

## 2020-01-27 DIAGNOSIS — M1711 Unilateral primary osteoarthritis, right knee: Secondary | ICD-10-CM

## 2020-01-27 NOTE — Patient Instructions (Addendum)
Good to see you.  Ice 20 minutes 2 times daily. Usually after activity and before bed. Exercises 3 times a week.  pennsaid pinkie amount topically 2 times daily as needed.  I will be thinking of your guys next week  See me again in 6 weeks

## 2020-01-27 NOTE — Assessment & Plan Note (Addendum)
Patient continues to have pain.  Feels like another injection in the sling would not be helpful though would consider the possibility of viscosupplementation.  Also considering just possibly having surgical intervention.  Patient will consider about repeating formal physical therapy as well.  Follow-up again in 6 weeks once again social determinants of health includes patient's husband undergoing a general aortic valve replacement but likely contributing to her not being able to do the exercises regularly or do any type of surgical intervention

## 2020-01-27 NOTE — Assessment & Plan Note (Signed)
Problem with exacerbation.  Underlying arthritic changes of the leg.  This likely contributes to some aching with recent swelling.  Discussed medication management including gabapentin as well as tramadol for breakthrough pain.  Has not been taking tramadol time.  Compression, icing regimen, patient wants to avoid surgical intervention.  Follow-up again in 4 to 8 weeks could be a candidate for viscosupplementation

## 2020-01-27 NOTE — Progress Notes (Signed)
[facility] [address] [place] Phone: [phone] Subjective:   I Abigail [name] am serving as a Education administrator for Dr. Hulan Saas.  This visit occurred during the SARS-CoV-2 public health emergency.  Safety protocols were in place, including screening questions prior to the visit, additional usage of staff PPE, and extensive cleaning of exam room while observing appropriate contact time as indicated for disinfecting solutions.   I'm seeing this patient by the request  of:  [name], MD  CC: Left knee pain, right knee pain  [id number]   10/01/2019 Aspiration done today.  Discussed icing regimen and home exercise, discussed which activities to do which was to avoid.  Discussed posture and ergonomics, discussed avoiding certain activities.  Patient will have aspiration done again when needed.  01/27/2020 Abigail [name] is a 79 y.o. female coming in with complaint of left knee pain. Patient states the knee is doing well. Cyst is not as bad. Right knee is painful due to compensation. States she needs to exercise and stretch more. Would like to start walking again. Using compression sleeve and states it did not help for the right knee.  Patient has had more pain recently.  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     Past Medical History:  Diagnosis Date   Arthritis    oa   Baker's cyst    behind left knee   Constipation    Hemorrhoids    Hypertension    MVP (mitral valve prolapse)    Neoplasm    of appendix   Rosacea    Past Surgical History:  Procedure Laterality Date   AUGMENTATION MAMMAPLASTY Bilateral 1990   saline   CARDIAC CATHETERIZATION  17years ago   small amount of blockage   CATRACTS Bilateral    COLONOSCOPY  2006 and oct 2018   ILEOCECETOMY N/A 08/17/2017   Procedure: OPEN ILEOCECETOMY;  Surgeon: Armandina Gemma, MD;  Location: [facility];  Service:  General;  Laterality: N/A;  ERAS PATHWAY   Social History   Socioeconomic History   Marital status: Married    Spouse name: Not on file   Number of children: Not on file   Years of education: Not on file   Highest education level: Not on file  Occupational History   Not on file  Tobacco Use   Smoking status: Former Smoker    Packs/day: 0.50    Years: 10.00    Pack years: 5.00    Types: Cigarettes   Smokeless tobacco: Never Used   Tobacco comment: quit 35 yrs ago  Substance and Sexual Activity   Alcohol use: Yes    Alcohol/week: 7.0 standard drinks    Types: 7 Glasses of wine per week    Comment: 1 glass red wine  per night   Drug use: No   Sexual activity: Not on file  Other Topics Concern   Not on file  Social History Narrative   Not on file   Social Determinants of Health   Financial Resource Strain:    Difficulty of Paying Living Expenses:   Food Insecurity:    Worried About Charity fundraiser in the Last Year:    Arboriculturist in the Last Year:   Transportation Needs:    Film/video editor (Medical):    Lack of Transportation (Non-Medical):   Physical Activity:    Days of Exercise  per Week:    Minutes of Exercise per Session:   Stress:    Feeling of Stress :   Social Connections:    Frequency of Communication with Friends and Family:    Frequency of Social Gatherings with Friends and Family:    Attends Religious Services:    Active Member of Clubs or Organizations:    Attends Music therapist:    Marital Status:    Allergies  Allergen Reactions   Statins Diarrhea and Nausea And Vomiting   Family History  Problem Relation Age of Onset   Colon cancer Cousin        13's maybe     Current Outpatient Medications (Cardiovascular):    losartan (COZAAR) 25 MG tablet, Take 25 mg by mouth at bedtime.    Current Outpatient Medications (Analgesics):    Acetaminophen-Aspirin Buffered (EXCEDRIN BACK &  BODY) 250-250 MG tablet, Take 0.05 tablets by mouth 2 (two) times daily.   Current Outpatient Medications (Other):    gabapentin (NEURONTIN) 100 MG capsule, TAKE 2 CAPSULES AT BEDTIME.   gabapentin (NEURONTIN) 100 MG capsule, Take 2 capsules (200 mg total) by mouth at bedtime.   LORazepam (ATIVAN) 0.5 MG tablet, Take 0.5 mg by mouth 2 (two) times daily as needed for anxiety.   metroNIDAZOLE (METROGEL) 0.75 % gel, Apply 1 application topically as needed.    Reviewed prior external information including notes and imaging from  primary care provider As well as notes that were available from care everywhere and other healthcare systems.  Past medical history, social, surgical and family history all reviewed in electronic medical record.  No pertanent information unless stated regarding to the chief complaint.   Review of Systems:  No headache, visual changes, nausea, vomiting, diarrhea, constipation, dizziness, abdominal pain, skin rash, fevers, chills, night sweats, weight loss, swollen lymph nodes, body aches, joint swelling, chest pain, shortness of breath, mood changes. POSITIVE muscle aches  Objective  Blood pressure 140/82, pulse 63, height 5\' 6"  (1.676 m), weight 126 lb (57.2 kg), SpO2 98 %.   General: No apparent distress alert and oriented x3 mood and affect normal, dressed appropriately.  HEENT: Pupils equal, extraocular movements intact  Respiratory: Patient's speak in full sentences and does not appear short of breath  Cardiovascular: No lower extremity edema, non tender, no erythema  Neuro: Cranial nerves II through XII are intact, neurovascularly intact in all extremities with 2+ DTRs and 2+ pulses.  Gait antalgic gait.  Arthritic changes of multiple joints.  Knee: Left valgus deformity noted. Large thigh to calf ratio.  Tender to palpation over medial and PF joint line.  Large Baker's cyst noted in the popliteal area ROM full in flexion and extension and lower leg  rotation. instability with valgus force.  painful patellar compression. Patellar glide with moderate crepitus. Patellar and quadriceps tendons unremarkable. Hamstring and quadriceps strength is normal. Contralateral knee shows significant arthritic changes with instability as well.   Procedure: Real-time Ultrasound Guided Injection of left knee and aspiration of Baker's cyst Device: GE Logiq Q7 Ultrasound guided injection is preferred based studies that show increased duration, increased effect, greater accuracy, decreased procedural pain, increased response rate, and decreased cost with ultrasound guided versus blind injection.  Verbal informed consent obtained.  Time-out conducted.  Noted no overlying erythema, induration, or other signs of local infection.  Skin prepped in a sterile fashion.  Local anesthesia: Topical Ethyl chloride.  With sterile technique and under real time ultrasound guidance: With a 22-gauge 2  inch needle patient was injected with 4 cc of 0.5% Marcaine and aspirated 60 cc of straw-colored fluid then injected 1 cc of Kenalog 40 mg/dL. This was from a posterior approach Completed without difficulty  Pain immediately resolved suggesting accurate placement of the medication.  Advised to call if fevers/chills, erythema, induration, drainage, or persistent bleeding.  Images permanently stored and available for review in the ultrasound unit.  Impression: Technically successful ultrasound guided injection.    Impression and Recommendations:     This case required medical decision making of moderate complexity. The above documentation has been reviewed and is accurate and complete Abigail Pulley, DO       Note: This dictation was prepared with Dragon dictation along with smaller phrase technology. Any transcriptional errors that result from this process are unintentional.

## 2020-03-09 ENCOUNTER — Ambulatory Visit: Payer: Medicare Other | Admitting: Family Medicine

## 2020-05-05 DIAGNOSIS — I10 Essential (primary) hypertension: Secondary | ICD-10-CM | POA: Diagnosis not present

## 2020-05-05 DIAGNOSIS — E7800 Pure hypercholesterolemia, unspecified: Secondary | ICD-10-CM | POA: Diagnosis not present

## 2020-05-05 DIAGNOSIS — I2510 Atherosclerotic heart disease of native coronary artery without angina pectoris: Secondary | ICD-10-CM | POA: Diagnosis not present

## 2020-06-17 ENCOUNTER — Ambulatory Visit (INDEPENDENT_AMBULATORY_CARE_PROVIDER_SITE_OTHER): Payer: Medicare Other | Admitting: Cardiovascular Disease

## 2020-06-17 ENCOUNTER — Encounter: Payer: Self-pay | Admitting: Cardiovascular Disease

## 2020-06-17 ENCOUNTER — Other Ambulatory Visit: Payer: Self-pay

## 2020-06-17 VITALS — BP 188/87 | HR 71 | Ht 66.0 in | Wt 123.2 lb

## 2020-06-17 DIAGNOSIS — E7800 Pure hypercholesterolemia, unspecified: Secondary | ICD-10-CM

## 2020-06-17 DIAGNOSIS — I10 Essential (primary) hypertension: Secondary | ICD-10-CM | POA: Diagnosis not present

## 2020-06-17 DIAGNOSIS — I341 Nonrheumatic mitral (valve) prolapse: Secondary | ICD-10-CM | POA: Diagnosis not present

## 2020-06-17 NOTE — Patient Instructions (Signed)
Medication Instructions:  No changes *If you need a refill on your cardiac medications before your next appointment, please call your pharmacy*   Lab Work: None ordered If you have labs (blood work) drawn today and your tests are completely normal, you will receive your results only by: Marland Kitchen MyChart Message (if you have MyChart) OR . A paper copy in the mail If you have any lab test that is abnormal or we need to change your treatment, we will call you to review the results.   Testing/Procedures: Dr. Sallyanne Kuster has ordered a CT coronary calcium score. This test is done at [address]. This is $150 out of pocket.   Coronary CalciumScan A coronary calcium scan is an imaging test used to look for deposits of calcium and other fatty materials (plaques) in the inner lining of the blood vessels of the heart (coronary arteries). These deposits of calcium and plaques can partly clog and narrow the coronary arteries without producing any symptoms or warning signs. This puts a person at risk for a heart attack. This test can detect these deposits before symptoms develop. Tell a health care provider about:  Any allergies you have.  All medicines you are taking, including vitamins, herbs, eye drops, creams, and over-the-counter medicines.  Any problems you or family members have had with anesthetic medicines.  Any blood disorders you have.  Any surgeries you have had.  Any medical conditions you have.  Whether you are pregnant or may be pregnant. What are the risks? Generally, this is a safe procedure. However, problems may occur, including:  Harm to a pregnant woman and her unborn baby. This test involves the use of radiation. Radiation exposure can be dangerous to a pregnant woman and her unborn baby. If you are pregnant, you generally should not have this procedure done.  Slight increase in the risk of cancer. This is because of the radiation involved in the test. What  happens before the procedure? No preparation is needed for this procedure. What happens during the procedure?  You will undress and remove any jewelry around your neck or chest.  You will put on a hospital gown.  Sticky electrodes will be placed on your chest. The electrodes will be connected to an electrocardiogram (ECG) machine to record a tracing of the electrical activity of your heart.  A CT scanner will take pictures of your heart. During this time, you will be asked to lie still and hold your breath for 2-3 seconds while a picture of your heart is being taken. The procedure may vary among health care providers and hospitals. What happens after the procedure?  You can get dressed.  You can return to your normal activities.  It is up to you to get the results of your test. Ask your health care provider, or the department that is doing the test, when your results will be ready. Summary  A coronary calcium scan is an imaging test used to look for deposits of calcium and other fatty materials (plaques) in the inner lining of the blood vessels of the heart (coronary arteries).  Generally, this is a safe procedure. Tell your health care provider if you are pregnant or may be pregnant.  No preparation is needed for this procedure.  A CT scanner will take pictures of your heart.  You can return to your normal activities after the scan is done. This information is not intended to replace advice given to you by your health care  provider. Make sure you discuss any questions you have with your health care provider. Document Released: 04/07/2008 Document Revised: 08/29/2016 Document Reviewed: 08/29/2016 Elsevier Interactive Patient Education  2017 [place]: At [facility], you and your health needs are our priority.  As part of our continuing mission to provide you with exceptional heart care, we have created designated Provider Care Teams.  These Care Teams  include your primary Cardiologist (physician) and Advanced Practice Providers (APPs -  Physician Assistants and Nurse Practitioners) who all work together to provide you with the care you need, when you need it.  We recommend signing up for the patient portal called "MyChart".  Sign up information is provided on this After Visit Summary.  MyChart is used to connect with patients for Virtual Visits (Telemedicine).  Patients are able to view lab/test results, encounter notes, upcoming appointments, etc.  Non-urgent messages can be sent to your provider as well.   To learn more about what you can do with MyChart, go to [email or web address].    Your next appointment:   Follow up as needed with Dr. Sallyanne Kuster

## 2020-06-18 ENCOUNTER — Encounter: Payer: Self-pay | Admitting: Cardiovascular Disease

## 2020-06-18 NOTE — Progress Notes (Signed)
Cardiology New Patient Office Note:    Date:  06/18/2020   ID:  Abigail [name], DOB 04/07/1941, MRN [id number]  PCP:  [name], MD  [facility] Cardiologist:  No primary care provider on file.  [facility] Electrophysiologist:  None   Referring MD: [name], MD   No chief complaint on file. New patient visit  History of Present Illness:    Abigail [name] is a 79 y.o. female with a hx of remarkably good health.  Her husband Quillian Quince is also my patient.  She would like to know recommendations regarding treatment of her moderate hypercholesterolemia.  She has well treated systemic hypertension, although her blood pressure is unusually high today.  On July 13 her blood pressure was documented 134/80 at a physician visit.  She is very lean and fit.  Most recent labs showed total cholesterol 219, HDL 77 and 80 LDL 125.  She does not have a history of coronary disease and actually underwent a cardiac catheterization about 20 years ago for what she describes as "heartburn" with near normal results.  She does not have known PAD and denies claudication, angina, dyspnea at rest or with activity, palpitations, dizziness, syncope, focal neurological events, lower extremity edema.  Although she has occasional ectopic beats on her exam, she is not aware of these.  She was diagnosed with "mitral valve prolapse" during her last pregnancy in the early 70s, but does not have a click or murmur on exam today.  Mitral valve prolapse was confirmed during left ventriculography in 2007.  There was no mitral regurgitation.  The cardiac catheterization showed "smooth 40% segmental proximal LAD disease with mild calcification and 30% eccentric mid RCA disease".  She has not been receiving statin therapy.  Multiple attempts to treat her hypercholesterolemia the past have been unsuccessful.  She tried both atorvastatin and rosuvastatin but these induced visual changes.  She has recently been prescribed pravastatin but  after taking this for just a few doses she began to develop heartburn and stopped it with resolution of symptoms.  Past Medical History:  Diagnosis Date  . Arthritis    oa  . Baker's cyst    behind left knee  . Constipation   . Hemorrhoids   . Hypertension   . MVP (mitral valve prolapse)   . Neoplasm    of appendix  . Rosacea     Past Surgical History:  Procedure Laterality Date  . AUGMENTATION MAMMAPLASTY Bilateral 1990   saline  . CARDIAC CATHETERIZATION  17years ago   small amount of blockage  . CATRACTS Bilateral   . COLONOSCOPY  2006 and oct 2018  . ILEOCECETOMY N/A 08/17/2017   Procedure: OPEN ILEOCECETOMY;  Surgeon: Armandina Gemma, MD;  Location: [facility];  Service: General;  Laterality: N/A;  ERAS PATHWAY    Current Medications: Current Meds  Medication Sig  . Acetaminophen-Aspirin Buffered (EXCEDRIN BACK & BODY) 250-250 MG tablet Take 0.05 tablets by mouth 2 (two) times daily.  . famotidine-calcium carbonate-magnesium hydroxide (PEPCID COMPLETE) 10-800-165 MG chewable tablet Chew 1 tablet by mouth daily as needed.  . gabapentin (NEURONTIN) 100 MG capsule Take 2 capsules (200 mg total) by mouth at bedtime.  Marland Kitchen LORazepam (ATIVAN) 0.5 MG tablet Take 0.5 mg by mouth 2 (two) times daily as needed for anxiety.  Marland Kitchen losartan (COZAAR) 100 MG tablet Take 100 mg by mouth daily.  Marland Kitchen losartan (COZAAR) 25 MG tablet Take 25 mg by mouth at bedtime.   . pravastatin (PRAVACHOL) 10  MG tablet Take by mouth.     Allergies:   Statins   Social History   Socioeconomic History  . Marital status: Married    Spouse name: Not on file  . Number of children: Not on file  . Years of education: Not on file  . Highest education level: Not on file  Occupational History  . Not on file  Tobacco Use  . Smoking status: Former Smoker    Packs/day: 0.50    Years: 10.00    Pack years: 5.00    Types: Cigarettes  . Smokeless tobacco: Never Used  . Tobacco comment: quit 35 yrs ago  Vaping Use  .  Vaping Use: Never used  Substance and Sexual Activity  . Alcohol use: Yes    Alcohol/week: 7.0 standard drinks    Types: 7 Glasses of wine per week    Comment: 1 glass red wine  per night  . Drug use: No  . Sexual activity: Not on file  Other Topics Concern  . Not on file  Social History Narrative  . Not on file   Social Determinants of Health   Financial Resource Strain:   . Difficulty of Paying Living Expenses: Not on file  Food Insecurity:   . Worried About Charity fundraiser in the Last Year: Not on file  . Ran Out of Food in the Last Year: Not on file  Transportation Needs:   . Lack of Transportation (Medical): Not on file  . Lack of Transportation (Non-Medical): Not on file  Physical Activity:   . Days of Exercise per Week: Not on file  . Minutes of Exercise per Session: Not on file  Stress:   . Feeling of Stress : Not on file  Social Connections:   . Frequency of Communication with Friends and Family: Not on file  . Frequency of Social Gatherings with Friends and Family: Not on file  . Attends Religious Services: Not on file  . Active Member of Clubs or Organizations: Not on file  . Attends Archivist Meetings: Not on file  . Marital Status: Not on file     Family History: The patient's family history includes Colon cancer in her cousin.  ROS:   Please see the history of present illness.     All other systems reviewed and are negative.  EKGs/Labs/Other Studies Reviewed:    The following studies were reviewed today: Cardiac catheterization report from 2007, images not available.  EKG:  EKG is ordered today.  The ekg ordered today demonstrates normal sinus rhythm, completely normal tracing, QTC 412 ms  Recent Labs: No results found for requested labs within last 8760 hours.  Recent Lipid Panel No results found for: CHOL, TRIG, HDL, Janene Harvey [facility], LDLDIRECT   Labs August 2021 [facility] medical Hemoglobin 15, creatinine 0.88, TSH 1.11,  normal liver function tests Total cholesterol 219, LDL 77, triglycerides 98, LDL 125  Physical Exam:    VS:  BP (!) 188/87   Pulse 71   Ht 5\' 6"  (1.676 m)   Wt 123 lb 3.2 oz (55.9 kg)   SpO2 100%   BMI 19.89 kg/m     Wt Readings from Last 3 Encounters:  06/17/20 123 lb 3.2 oz (55.9 kg)  01/27/20 126 lb (57.2 kg)  10/01/19 125 lb (56.7 kg)     GEN:  Well nourished, well developed in no acute distress HEENT: Normal NECK: No JVD; No carotid bruits LYMPHATICS: No lymphadenopathy CARDIAC: RRR with rare  ectopy, no murmurs, rubs, gallops I do not hear a systolic click or murmur either at rest or with provocative maneuvers. RESPIRATORY:  Clear to auscultation without rales, wheezing or rhonchi  ABDOMEN: Soft, non-tender, non-distended MUSCULOSKELETAL:  No edema; No deformity  SKIN: Warm and dry NEUROLOGIC:  Alert and oriented x 3 PSYCHIATRIC:  Normal affect   ASSESSMENT:    1. Hyperlipidemia, unspecified hyperlipidemia type    PLAN:    In order of problems listed above:  1. HLP: Would like to review her previous angiography and will keep trying to get the films.  However, she is almost 79 years old without any symptoms of coronary disease or any serious vascular issues.  Will recommend a coronary calcium score.  If this is high, she should receive treatment with PCSK9 inhibitors, since she has been intolerant of multiple statins.  If her coronary calcium score places her at low risk, would continue more conservative management.  We could try ezetimibe. 2. MVP: Described in 2007 on LV angiogram, but without audible click or murmur. 3.  HTN: Usually very well controlled.  Probably situational hypertension today.   Medication Adjustments/Labs and Tests Ordered: Current medicines are reviewed at length with the patient today.  Concerns regarding medicines are outlined above.  Orders Placed This Encounter  Procedures  . CT CARDIAC SCORING   No orders of the defined types were  placed in this encounter.   Patient Instructions  Medication Instructions:  No changes *If you need a refill on your cardiac medications before your next appointment, please call your pharmacy*   Lab Work: None ordered If you have labs (blood work) drawn today and your tests are completely normal, you will receive your results only by: Marland Kitchen MyChart Message (if you have MyChart) OR . A paper copy in the mail If you have any lab test that is abnormal or we need to change your treatment, we will call you to review the results.   Testing/Procedures: Dr. Sallyanne Kuster has ordered a CT coronary calcium score. This test is done at [address]. This is $150 out of pocket.   Coronary CalciumScan A coronary calcium scan is an imaging test used to look for deposits of calcium and other fatty materials (plaques) in the inner lining of the blood vessels of the heart (coronary arteries). These deposits of calcium and plaques can partly clog and narrow the coronary arteries without producing any symptoms or warning signs. This puts a person at risk for a heart attack. This test can detect these deposits before symptoms develop. Tell a health care provider about:  Any allergies you have.  All medicines you are taking, including vitamins, herbs, eye drops, creams, and over-the-counter medicines.  Any problems you or family members have had with anesthetic medicines.  Any blood disorders you have.  Any surgeries you have had.  Any medical conditions you have.  Whether you are pregnant or may be pregnant. What are the risks? Generally, this is a safe procedure. However, problems may occur, including:  Harm to a pregnant woman and her unborn baby. This test involves the use of radiation. Radiation exposure can be dangerous to a pregnant woman and her unborn baby. If you are pregnant, you generally should not have this procedure done.  Slight increase in the risk of cancer. This is  because of the radiation involved in the test. What happens before the procedure? No preparation is needed for this procedure. What happens during the procedure?  You will undress and remove any jewelry around your neck or chest.  You will put on a hospital gown.  Sticky electrodes will be placed on your chest. The electrodes will be connected to an electrocardiogram (ECG) machine to record a tracing of the electrical activity of your heart.  A CT scanner will take pictures of your heart. During this time, you will be asked to lie still and hold your breath for 2-3 seconds while a picture of your heart is being taken. The procedure may vary among health care providers and hospitals. What happens after the procedure?  You can get dressed.  You can return to your normal activities.  It is up to you to get the results of your test. Ask your health care provider, or the department that is doing the test, when your results will be ready. Summary  A coronary calcium scan is an imaging test used to look for deposits of calcium and other fatty materials (plaques) in the inner lining of the blood vessels of the heart (coronary arteries).  Generally, this is a safe procedure. Tell your health care provider if you are pregnant or may be pregnant.  No preparation is needed for this procedure.  A CT scanner will take pictures of your heart.  You can return to your normal activities after the scan is done. This information is not intended to replace advice given to you by your health care provider. Make sure you discuss any questions you have with your health care provider. Document Released: 04/07/2008 Document Revised: 08/29/2016 Document Reviewed: 08/29/2016 Elsevier Interactive Patient Education  2017 [place]: At [facility], you and your health needs are our priority.  As part of our continuing mission to provide you with exceptional heart care, we have created  designated Provider Care Teams.  These Care Teams include your primary Cardiologist (physician) and Advanced Practice Providers (APPs -  Physician Assistants and Nurse Practitioners) who all work together to provide you with the care you need, when you need it.  We recommend signing up for the patient portal called "MyChart".  Sign up information is provided on this After Visit Summary.  MyChart is used to connect with patients for Virtual Visits (Telemedicine).  Patients are able to view lab/test results, encounter notes, upcoming appointments, etc.  Non-urgent messages can be sent to your provider as well.   To learn more about what you can do with MyChart, go to [email or web address].    Your next appointment:   Follow up as needed with Dr. Sallyanne Kuster    Signed, Sanda Klein, MD  06/18/2020 3:18 PM    [place]

## 2020-06-25 ENCOUNTER — Other Ambulatory Visit: Payer: Self-pay

## 2020-06-25 ENCOUNTER — Ambulatory Visit (INDEPENDENT_AMBULATORY_CARE_PROVIDER_SITE_OTHER)
Admission: RE | Admit: 2020-06-25 | Discharge: 2020-06-25 | Disposition: A | Discharge status: Home / Self Care | Payer: Self-pay | Source: Ambulatory Visit | Attending: Cardiovascular Disease | Admitting: Cardiovascular Disease

## 2020-06-25 DIAGNOSIS — I700 Atherosclerosis of aorta: Secondary | ICD-10-CM | POA: Diagnosis not present

## 2020-06-25 DIAGNOSIS — E7800 Pure hypercholesterolemia, unspecified: Secondary | ICD-10-CM

## 2020-06-25 DIAGNOSIS — Z9882 Breast implant status: Secondary | ICD-10-CM | POA: Diagnosis not present

## 2020-06-26 ENCOUNTER — Telehealth: Payer: Self-pay | Admitting: *Deleted

## 2020-06-26 DIAGNOSIS — E7800 Pure hypercholesterolemia, unspecified: Secondary | ICD-10-CM

## 2020-06-26 MED ORDER — EZETIMIBE 10 MG PO TABS: 10.0000 mg | ORAL_TABLET | Freq: Every day | ORAL | 11 refills | Status: DC

## 2020-06-26 NOTE — Telephone Encounter (Signed)
Patient made aware of results and verbalized understanding.  Zetia has been sent in for her.

## 2020-06-26 NOTE — Telephone Encounter (Signed)
Patient's husband states the patient can be reached at her cell phone [phone]

## 2020-06-26 NOTE — Telephone Encounter (Signed)
-----   Message from Sanda Klein, MD sent at 06/25/2020  3:31 PM EDT ----- Calcium score is slightly higher tan average (55th percentile). Places the "10-year risk of an adverse event" at 6%" - higher than desirable, but not terrible.  I would suggest trying ezetimibe 10 mg once daily and recheck lipids in 3 months. If we can get the LDL under 100 (recently was 125) I would be satisfied, at least for now.

## 2020-07-06 DIAGNOSIS — I10 Essential (primary) hypertension: Secondary | ICD-10-CM | POA: Diagnosis not present

## 2020-07-06 DIAGNOSIS — E7800 Pure hypercholesterolemia, unspecified: Secondary | ICD-10-CM | POA: Diagnosis not present

## 2020-07-06 DIAGNOSIS — Z0000 Encounter for general adult medical examination without abnormal findings: Secondary | ICD-10-CM | POA: Diagnosis not present

## 2020-07-06 DIAGNOSIS — E039 Hypothyroidism, unspecified: Secondary | ICD-10-CM | POA: Diagnosis not present

## 2020-07-06 DIAGNOSIS — E785 Hyperlipidemia, unspecified: Secondary | ICD-10-CM | POA: Diagnosis not present

## 2020-07-08 ENCOUNTER — Other Ambulatory Visit: Payer: Self-pay | Admitting: Internal Medicine

## 2020-07-08 DIAGNOSIS — Z1231 Encounter for screening mammogram for malignant neoplasm of breast: Secondary | ICD-10-CM

## 2020-07-09 DIAGNOSIS — I10 Essential (primary) hypertension: Secondary | ICD-10-CM | POA: Diagnosis not present

## 2020-07-09 DIAGNOSIS — E871 Hypo-osmolality and hyponatremia: Secondary | ICD-10-CM | POA: Diagnosis not present

## 2020-07-09 DIAGNOSIS — E7800 Pure hypercholesterolemia, unspecified: Secondary | ICD-10-CM | POA: Diagnosis not present

## 2020-07-09 DIAGNOSIS — Z23 Encounter for immunization: Secondary | ICD-10-CM | POA: Diagnosis not present

## 2020-07-09 DIAGNOSIS — Z0000 Encounter for general adult medical examination without abnormal findings: Secondary | ICD-10-CM | POA: Diagnosis not present

## 2020-07-09 DIAGNOSIS — I2510 Atherosclerotic heart disease of native coronary artery without angina pectoris: Secondary | ICD-10-CM | POA: Diagnosis not present

## 2020-07-23 ENCOUNTER — Ambulatory Visit: Payer: Medicare Other

## 2020-08-07 ENCOUNTER — Ambulatory Visit: Payer: Medicare Other

## 2020-09-02 ENCOUNTER — Ambulatory Visit
Admission: RE | Admit: 2020-09-02 | Discharge: 2020-09-02 | Disposition: A | Discharge status: Home / Self Care | Payer: Medicare Other | Source: Ambulatory Visit | Attending: Internal Medicine | Admitting: Internal Medicine

## 2020-09-02 ENCOUNTER — Other Ambulatory Visit: Payer: Self-pay

## 2020-09-02 DIAGNOSIS — Z1231 Encounter for screening mammogram for malignant neoplasm of breast: Secondary | ICD-10-CM | POA: Diagnosis not present

## 2020-11-04 ENCOUNTER — Ambulatory Visit: Payer: Self-pay

## 2020-11-04 ENCOUNTER — Encounter: Payer: Self-pay | Admitting: Family Medicine

## 2020-11-04 ENCOUNTER — Other Ambulatory Visit: Payer: Self-pay

## 2020-11-04 ENCOUNTER — Ambulatory Visit (INDEPENDENT_AMBULATORY_CARE_PROVIDER_SITE_OTHER): Payer: Medicare Other | Admitting: Family Medicine

## 2020-11-04 VITALS — BP 128/90 | HR 74 | Ht 66.0 in | Wt 121.0 lb

## 2020-11-04 DIAGNOSIS — G8929 Other chronic pain: Secondary | ICD-10-CM

## 2020-11-04 DIAGNOSIS — M7062 Trochanteric bursitis, left hip: Secondary | ICD-10-CM

## 2020-11-04 DIAGNOSIS — M7122 Synovial cyst of popliteal space [Baker], left knee: Secondary | ICD-10-CM | POA: Diagnosis not present

## 2020-11-04 DIAGNOSIS — M25562 Pain in left knee: Secondary | ICD-10-CM

## 2020-11-04 NOTE — Progress Notes (Signed)
[facility] [place] Phone: [phone] Subjective:   [name], am serving as a scribe for Dr. Hulan Saas. This visit occurred during the SARS-CoV-2 public health emergency.  Safety protocols were in place, including screening questions prior to the visit, additional usage of staff PPE, and extensive cleaning of exam room while observing appropriate contact time as indicated for disinfecting solutions.   I'm seeing this patient by the request  of:  [name], MD  CC: Knee pain and swelling  ELF:[id number]   01/27/2020 Patient continues to have pain.  Feels like another injection in the sling would not be helpful though would consider the possibility of viscosupplementation.  Also considering just possibly having surgical intervention.  Patient will consider about repeating formal physical therapy as well.  Follow-up again in 6 weeks once again social determinants of health includes patient's husband undergoing a general aortic valve replacement but likely contributing to her not being able to do the exercises regularly or do any type of surgical intervention   Update 11/04/2020 MAJESTY STEHLIN is a 80 y.o. female coming in with complaint of left knee pain. Patient states that she has fullness in back of knee with entire leg achiness intermittently. Also complaining of left hip pain when she lies on her side that radiates down into her knee.   Patient states it does seem to get better with activity.     Past Medical History:  Diagnosis Date  . Arthritis    oa  . Baker's cyst    behind left knee  . Constipation   . Hemorrhoids   . Hypertension   . MVP (mitral valve prolapse)   . Neoplasm    of appendix  . Rosacea    Past Surgical History:  Procedure Laterality Date  . AUGMENTATION MAMMAPLASTY Bilateral 1990   saline  . CARDIAC CATHETERIZATION  17years ago   small amount of blockage  . CATRACTS Bilateral   .  COLONOSCOPY  2006 and oct 2018  . ILEOCECETOMY N/A 08/17/2017   Procedure: OPEN ILEOCECETOMY;  Surgeon: Armandina Gemma, MD;  Location: [facility];  Service: General;  Laterality: N/A;  ERAS PATHWAY   Social History   Socioeconomic History  . Marital status: Married    Spouse name: Not on file  . Number of children: Not on file  . Years of education: Not on file  . Highest education level: Not on file  Occupational History  . Not on file  Tobacco Use  . Smoking status: Former Smoker    Packs/day: 0.50    Years: 10.00    Pack years: 5.00    Types: Cigarettes  . Smokeless tobacco: Never Used  . Tobacco comment: quit 35 yrs ago  Vaping Use  . Vaping Use: Never used  Substance and Sexual Activity  . Alcohol use: Yes    Alcohol/week: 7.0 standard drinks    Types: 7 Glasses of wine per week    Comment: 1 glass red wine  per night  . Drug use: No  . Sexual activity: Not on file  Other Topics Concern  . Not on file  Social History Narrative  . Not on file   Social Determinants of Health   Financial Resource Strain: Not on file  Food Insecurity: Not on file  Transportation Needs: Not on file  Physical Activity: Not on file  Stress: Not on file  Social Connections: Not on file   Allergies  Allergen Reactions  . Statins Diarrhea and Nausea And Vomiting   Family History  Problem Relation Age of Onset  . Colon cancer Cousin        60's maybe     Current Outpatient Medications (Cardiovascular):  .  ezetimibe (ZETIA) 10 MG tablet, Take 1 tablet (10 mg total) by mouth daily. Marland Kitchen  losartan (COZAAR) 100 MG tablet, Take 100 mg by mouth daily. Marland Kitchen  losartan (COZAAR) 25 MG tablet, Take 25 mg by mouth at bedtime.  .  pravastatin (PRAVACHOL) 10 MG tablet, Take by mouth.   Current Outpatient Medications (Analgesics):  Marland Kitchen  Acetaminophen-Aspirin Buffered 250-250 MG tablet, Take 0.05 tablets by mouth 2 (two) times daily.   Current Outpatient Medications (Other):  .  famotidine-calcium  carbonate-magnesium hydroxide (PEPCID COMPLETE) 10-800-165 MG chewable tablet, Chew 1 tablet by mouth daily as needed. .  gabapentin (NEURONTIN) 100 MG capsule, Take 2 capsules (200 mg total) by mouth at bedtime. Marland Kitchen  LORazepam (ATIVAN) 0.5 MG tablet, Take 0.5 mg by mouth 2 (two) times daily as needed for anxiety.   Reviewed prior external information including notes and imaging from  primary care provider As well as notes that were available from care everywhere and other healthcare systems.  Past medical history, social, surgical and family history all reviewed in electronic medical record.  No pertanent information unless stated regarding to the chief complaint.   Review of Systems:  No headache, visual changes, nausea, vomiting, diarrhea, constipation, dizziness, abdominal pain, skin rash, fevers, chills, night sweats, weight loss, swollen lymph nodes, body aches, joint swelling, chest pain, shortness of breath, mood changes. POSITIVE muscle aches  Objective  Blood pressure 128/90, pulse 74, height 5\' 6"  (1.676 m), weight 121 lb (54.9 kg), SpO2 99 %.   General: No apparent distress alert and oriented x3 mood and affect normal, dressed appropriately.  HEENT: Pupils equal, extraocular movements intact  Respiratory: Patient's speak in full sentences and does not appear short of breath  Cardiovascular: No lower extremity edema, non tender, no erythema  Gait antalgic  MSK: Left knee exam does have instability with valgus and varus force.  Patient does have fullness noted in the popliteal area.  Lacks last 10 degrees of flexion. Left hip exam shows the patient has relatively good range of motion.  He is severely tender over the greater trochanteric area on the lateral aspect.  Negative straight leg test.  Minimal discomfort of the left sacroiliac joint  Procedure: Real-time Ultrasound Guided Injection and aspiration of left Baker's cyst Device: GE Logiq Q7 Ultrasound guided injection is  preferred based studies that show increased duration, increased effect, greater accuracy, decreased procedural pain, increased response rate, and decreased cost with ultrasound guided versus blind injection.  Verbal informed consent obtained.  Time-out conducted.  Noted no overlying erythema, induration, or other signs of local infection.  Skin prepped in a sterile fashion.  Local anesthesia: Topical Ethyl chloride.  With sterile technique and under real time ultrasound guidance: With a 22-gauge 2 inch needle patient was injected with 4 cc of 0.5% Marcaine and 1 cc of Kenalog 40 mg/dL. This was from a posterior approach Completed without difficulty  Pain immediately resolved suggesting accurate placement of the medication.  Advised to call if fevers/chills, erythema, induration, drainage, or persistent bleeding.  Impression: Technically successful ultrasound guided injection.    Impression and Recommendations:     The above documentation has been reviewed and is accurate and complete [name], DO

## 2020-11-04 NOTE — Patient Instructions (Signed)
Exercises 3x a week pennsaid 2x a day Ice 20 min 2x a day See me in 6 weeks

## 2020-11-04 NOTE — Assessment & Plan Note (Signed)
Chronic problem with worsening symptoms.  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  Did have accumulation of the Baker's cyst again.  Improvement in symptoms immediately after the aspiration.  Discussed compression sleeve follow-up again in 6 to 8 weeks to further evaluate again.

## 2020-11-04 NOTE — Assessment & Plan Note (Signed)
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  Patient does have significant scoliosis of the back that could be contributing as well.  Patient was tender to palpation over the lateral aspect of the hip that does get better with activity.  Given home exercises and work with Product/process development scientist.  Worsening pain consider injection and formal physical therapy at follow-up

## 2020-12-09 DIAGNOSIS — E7800 Pure hypercholesterolemia, unspecified: Secondary | ICD-10-CM | POA: Diagnosis not present

## 2020-12-09 DIAGNOSIS — I10 Essential (primary) hypertension: Secondary | ICD-10-CM | POA: Diagnosis not present

## 2020-12-14 NOTE — Progress Notes (Deleted)
[place] Phone: [phone] Subjective:    I'm seeing this patient by the request  of:  [name], MD  CC:   [id number]   11/04/2020 Mild overall causing more of an IT band syndrome.  Patient does have significant scoliosis of the back that could be contributing as well.  Patient was tender to palpation over the lateral aspect of the hip that does get better with activity.  Given home exercises and work with Product/process development scientist.  Worsening pain consider injection and formal physical therapy at follow-up  Chronic problem with worsening symptoms.  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  Did have accumulation of the Baker's cyst again.  Improvement in symptoms immediately after the aspiration.  Discussed compression sleeve follow-up again in 6 to 8 weeks to further evaluate again  Update 12/17/2020 Abigail [name] is a 80 y.o. female coming in with complaint of left knee pain. Patient states        Past Medical History:  Diagnosis Date  . Arthritis    oa  . Baker's cyst    behind left knee  . Constipation   . Hemorrhoids   . Hypertension   . MVP (mitral valve prolapse)   . Neoplasm    of appendix  . Rosacea    Past Surgical History:  Procedure Laterality Date  . AUGMENTATION MAMMAPLASTY Bilateral 1990   saline  . CARDIAC CATHETERIZATION  17years ago   small amount of blockage  . CATRACTS Bilateral   . COLONOSCOPY  2006 and oct 2018  . ILEOCECETOMY N/A 08/17/2017   Procedure: OPEN ILEOCECETOMY;  Surgeon: Armandina Gemma, MD;  Location: [facility];  Service: General;  Laterality: N/A;  [facility]AS PATHWAY   Social History   Socioeconomic History  . Marital status: Married    Spouse name: Not on file  . Number of children: Not on file  . Years of education: Not on file  . Highest education level: Not on file  Occupational History  . Not on file  Tobacco Use   . Smoking status: Former Smoker    Packs/day: 0.50    Years: 10.00    Pack years: 5.00    Types: Cigarettes  . Smokeless tobacco: Never Used  . Tobacco comment: quit 35 yrs ago  Vaping Use  . Vaping Use: Never used  Substance and Sexual Activity  . Alcohol use: Yes    Alcohol/week: 7.0 standard drinks    Types: 7 Glasses of wine per week    Comment: 1 glass red wine  per night  . Drug use: No  . Sexual activity: Not on file  Other Topics Concern  . Not on file  Social History Narrative  . Not on file   Social Determinants of Health   Financial Resource Strain: Not on file  Food Insecurity: Not on file  Transportation Needs: Not on file  Physical Activity: Not on file  Stress: Not on file  Social Connections: Not on file   Allergies  Allergen Reactions  . Statins Diarrhea and Nausea And Vomiting   Family History  Problem Relation Age of Onset  . Colon cancer Cousin        60's maybe     Current Outpatient Medications (Cardiovascular):  .  ezetimibe (ZETIA) 10 MG tablet, Take 1 tablet (10 mg total) by mouth daily. Marland Kitchen  losartan (COZAAR) 100 MG tablet,  Take 100 mg by mouth daily. Marland Kitchen  losartan (COZAAR) 25 MG tablet, Take 25 mg by mouth at bedtime.    Current Outpatient Medications (Analgesics):  Marland Kitchen  Acetaminophen-Aspirin Buffered 250-250 MG tablet, Take 0.05 tablets by mouth 2 (two) times daily.   Current Outpatient Medications (Other):  .  famotidine-calcium carbonate-magnesium hydroxide (PEPCID COMPLETE) 10-800-165 MG chewable tablet, Chew 1 tablet by mouth daily as needed. .  gabapentin (NEURONTIN) 100 MG capsule, Take 2 capsules (200 mg total) by mouth at bedtime. Marland Kitchen  LORazepam (ATIVAN) 0.5 MG tablet, Take 0.5 mg by mouth 2 (two) times daily as needed for anxiety.   Reviewed prior external information including notes and imaging from  primary care provider As well as notes that were available from care everywhere and other healthcare systems.  Past medical  history, social, surgical and family history all reviewed in electronic medical record.  No pertanent information unless stated regarding to the chief complaint.   Review of Systems:  No headache, visual changes, nausea, vomiting, diarrhea, constipation, dizziness, abdominal pain, skin rash, fevers, chills, night sweats, weight loss, swollen lymph nodes, body aches, joint swelling, chest pain, shortness of breath, mood changes. POSITIVE muscle aches  Objective  There were no vitals taken for this visit.   General: No apparent distress alert and oriented x3 mood and affect normal, dressed appropriately.  HEENT: Pupils equal, extraocular movements intact  Respiratory: Patient's speak in full sentences and does not appear short of breath  Cardiovascular: No lower extremity edema, non tender, no erythema  Gait normal with good balance and coordination.  MSK:  Non tender with full range of motion and good stability and symmetric strength and tone of shoulders, elbows, wrist, hip, knee and ankles bilaterally.     Impression and Recommendations:     The above documentation has been reviewed and is accurate and complete Jacqualin Combes

## 2020-12-15 DIAGNOSIS — I10 Essential (primary) hypertension: Secondary | ICD-10-CM | POA: Diagnosis not present

## 2020-12-15 DIAGNOSIS — I2510 Atherosclerotic heart disease of native coronary artery without angina pectoris: Secondary | ICD-10-CM | POA: Diagnosis not present

## 2020-12-15 DIAGNOSIS — E7800 Pure hypercholesterolemia, unspecified: Secondary | ICD-10-CM | POA: Diagnosis not present

## 2020-12-17 ENCOUNTER — Ambulatory Visit: Payer: Medicare Other | Admitting: Family Medicine

## 2020-12-21 ENCOUNTER — Ambulatory Visit: Payer: Medicare Other | Admitting: Physician Assistant

## 2021-01-27 DIAGNOSIS — H52203 Unspecified astigmatism, bilateral: Secondary | ICD-10-CM | POA: Diagnosis not present

## 2021-01-27 DIAGNOSIS — Z961 Presence of intraocular lens: Secondary | ICD-10-CM | POA: Diagnosis not present

## 2021-02-16 ENCOUNTER — Other Ambulatory Visit (HOSPITAL_BASED_OUTPATIENT_CLINIC_OR_DEPARTMENT_OTHER): Payer: Self-pay

## 2021-02-16 ENCOUNTER — Other Ambulatory Visit: Payer: Self-pay

## 2021-02-16 ENCOUNTER — Ambulatory Visit: Payer: Medicare Other | Attending: Internal Medicine

## 2021-02-16 DIAGNOSIS — Z23 Encounter for immunization: Secondary | ICD-10-CM

## 2021-02-16 MED ORDER — PFIZER-BIONT COVID-19 VAC-TRIS 30 MCG/0.3ML IM SUSP
INTRAMUSCULAR | 0 refills | Status: AC
  Filled 2021-02-16: qty 0.3, 1d supply, fill #0

## 2021-02-16 NOTE — Progress Notes (Signed)
   Covid-19 Vaccination Clinic  Name:  CAMALA TALWAR    MRN: [id number] DOB: 06-09-1941  02/16/2021  Ms. Menta was observed post Covid-19 immunization for 15 minutes without incident. She was provided with Vaccine Information Sheet and instruction to access the V-Safe system.   Ms. Valenti was instructed to call 911 with any severe reactions post vaccine: Marland Kitchen Difficulty breathing  . Swelling of face and throat  . A fast heartbeat  . A bad rash all over body  . Dizziness and weakness   Immunizations Administered    Name Date Dose VIS Date Route   PFIZER Comrnaty(Gray TOP) Covid-19 Vaccine 02/16/2021 12:40 PM 0.3 mL 10/01/2020 Intramuscular   Manufacturer: [employer]   Lot: [id number]   NDC: [phone]

## 2021-03-16 ENCOUNTER — Ambulatory Visit (INDEPENDENT_AMBULATORY_CARE_PROVIDER_SITE_OTHER): Payer: Medicare Other | Admitting: Family Medicine

## 2021-03-16 ENCOUNTER — Ambulatory Visit (INDEPENDENT_AMBULATORY_CARE_PROVIDER_SITE_OTHER): Payer: Medicare Other

## 2021-03-16 ENCOUNTER — Ambulatory Visit: Payer: Self-pay

## 2021-03-16 ENCOUNTER — Other Ambulatory Visit: Payer: Self-pay

## 2021-03-16 ENCOUNTER — Encounter: Payer: Self-pay | Admitting: Family Medicine

## 2021-03-16 VITALS — BP 172/108 | HR 81 | Ht 66.0 in | Wt 124.4 lb

## 2021-03-16 DIAGNOSIS — R0781 Pleurodynia: Secondary | ICD-10-CM

## 2021-03-16 DIAGNOSIS — M7122 Synovial cyst of popliteal space [Baker], left knee: Secondary | ICD-10-CM | POA: Diagnosis not present

## 2021-03-16 DIAGNOSIS — G8929 Other chronic pain: Secondary | ICD-10-CM

## 2021-03-16 DIAGNOSIS — M25562 Pain in left knee: Secondary | ICD-10-CM

## 2021-03-16 DIAGNOSIS — S2242XA Multiple fractures of ribs, left side, initial encounter for closed fracture: Secondary | ICD-10-CM | POA: Diagnosis not present

## 2021-03-16 NOTE — Patient Instructions (Signed)
Thank you for coming in today.  Call or go to the ER if you develop a large red swollen joint with extreme pain or oozing puss.   Please get an Xray today before you leave  Recheck as needed.    Rib Contusion A rib contusion is a deep bruise on the rib area. Contusions are the result of a blunt trauma that causes bleeding and injury to the tissues under the skin. A rib contusion may involve bruising of the ribs and of the skin and muscles in the area. The skin over the contusion may turn blue, purple, or yellow. Minor injuries result in a painless contusion. More severe contusions may be painful and swollen for a few weeks. What are the causes? This condition is usually caused by a hard, direct hit to an area of the body. This often occurs while playing contact sports. What are the signs or symptoms? Symptoms of this condition include:  Swelling and redness of the injured area.  Discoloration of the injured area.  Tenderness and soreness of the injured area.  Pain with or without movement.  Pain when breathing in. How is this diagnosed? This condition may be diagnosed based on:  Your symptoms and medical history.  A physical exam.  Imaging tests--such as an X-ray, CT scan, or MRI--to determine if there were internal injuries or broken bones (fractures). How is this treated? This condition may be treated with:  Rest. This is often the best treatment for a rib contusion.  Ice packs. This reduces swelling and inflammation.  Deep-breathing exercises. These may be recommended to reduce the risk for lung collapse and pneumonia.  Medicines. Over-the-counter or prescription medicines may be given to control pain.  Injection of a numbing medicine around the nerve near your injury (nerve block). Follow these instructions at home: Medicines  Take over-the-counter and prescription medicines only as told by your health care provider.  Ask your health care provider if the medicine  prescribed to you: ? Requires you to avoid driving or using machinery. ? Can cause constipation. You may need to take these actions to prevent or treat constipation:  Drink enough fluid to keep your urine pale yellow.  Take over-the-counter or prescription medicines.  Eat foods that are high in fiber, such as beans, whole grains, and fresh fruits and vegetables.  Limit foods that are high in fat and processed sugars, such as fried or sweet foods. Managing pain, stiffness, and swelling If directed, put ice on the injured area. To do this:  Put ice in a plastic bag.  Place a towel between your skin and the bag.  Leave the ice on for 20 minutes, 2-3 times a day.  Remove the ice if your skin turns bright red. This is very important. If you cannot feel pain, heat, or cold, you have a greater risk of damage to the area.   Activity  Rest the injured area.  Avoid strenuous activity and any activities or movements that cause pain. Be careful during activities, and avoid bumping the injured area.  Do not lift anything that is heavier than 5 lb (2.3 kg), or the limit that you are told, until your health care provider says that it is safe. General instructions  Do not use any products that contain nicotine or tobacco, such as cigarettes, e-cigarettes, and chewing tobacco. These can delay healing. If you need help quitting, ask your health care provider.  Do deep-breathing exercises as told by your health care provider.  If you were given an incentive spirometer, use it every 1-2 hours while you are awake, or as recommended by your health care provider. This device measures how well you are filling your lungs with each breath.  Keep all follow-up visits. This is important.   Contact a health care provider if you have:  Increased bruising or swelling.  Pain that is not controlled with treatment.  A fever. Get help right away if you:  Have difficulty breathing or shortness of  breath.  Develop a continual cough, or you cough up thick or bloody mucus from your lungs (sputum).  Feel nauseous or you vomit.  Have pain in your abdomen. These symptoms may represent a serious problem that is an emergency. Do not wait to see if the symptoms will go away. Get medical help right away. Call your local emergency services (911 in the [place] Do not drive yourself to the hospital. Summary  A rib contusion is a deep bruise on your rib area. Contusions are the result of a blunt trauma that causes bleeding and injury to the tissues under the skin.  The skin over the contusion may turn blue, purple, or yellow. Minor injuries may cause a painless contusion. More severe contusions may be painful and swollen for a few weeks.  Rest the injured area. Avoid strenuous activity and any activities or movements that cause pain. This information is not intended to replace advice given to you by your health care provider. Make sure you discuss any questions you have with your health care provider. Document Revised: 01/15/2020 Document Reviewed: 01/15/2020 Elsevier Patient Education  [place].
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
## 2021-03-18 NOTE — Progress Notes (Signed)
Radiology is concerned that there may be a fracture at the left posterior sixth rib.  This will take a little while to longer to get better than a bruised rib but is managed the same way.  I recommend rechecking with me or Dr. Tamala Julian in about a month.
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
## 2021-04-20 ENCOUNTER — Encounter: Payer: Self-pay | Admitting: Family Medicine

## 2021-04-20 ENCOUNTER — Other Ambulatory Visit: Payer: Self-pay

## 2021-04-20 ENCOUNTER — Ambulatory Visit (INDEPENDENT_AMBULATORY_CARE_PROVIDER_SITE_OTHER): Payer: Medicare Other | Admitting: Family Medicine

## 2021-04-20 ENCOUNTER — Ambulatory Visit: Payer: Self-pay

## 2021-04-20 VITALS — BP 142/108 | HR 53 | Ht 66.0 in | Wt 124.0 lb

## 2021-04-20 DIAGNOSIS — M25562 Pain in left knee: Secondary | ICD-10-CM

## 2021-04-20 DIAGNOSIS — M1712 Unilateral primary osteoarthritis, left knee: Secondary | ICD-10-CM

## 2021-04-20 DIAGNOSIS — G8929 Other chronic pain: Secondary | ICD-10-CM

## 2021-04-20 NOTE — Patient Instructions (Signed)
Good to see you Ribs look great Watch knee  Dr. Pricilla Holm Have appt in 6 weeks

## 2021-04-20 NOTE — Assessment & Plan Note (Signed)
Known arthritic changes.  We will continue to monitor.  Patient wants to avoid any type of surgical intervention.  Discussed icing regimen and home exercises in which activities to avoid.  Follow-up again in 6 to 8 weeks

## 2021-05-25 DIAGNOSIS — L218 Other seborrheic dermatitis: Secondary | ICD-10-CM | POA: Diagnosis not present

## 2021-05-25 DIAGNOSIS — L820 Inflamed seborrheic keratosis: Secondary | ICD-10-CM | POA: Diagnosis not present

## 2021-05-25 DIAGNOSIS — L308 Other specified dermatitis: Secondary | ICD-10-CM | POA: Diagnosis not present

## 2021-05-25 DIAGNOSIS — L255 Unspecified contact dermatitis due to plants, except food: Secondary | ICD-10-CM | POA: Diagnosis not present

## 2021-05-31 DIAGNOSIS — L255 Unspecified contact dermatitis due to plants, except food: Secondary | ICD-10-CM | POA: Diagnosis not present

## 2021-06-01 ENCOUNTER — Ambulatory Visit: Payer: Medicare Other | Admitting: Family Medicine

## 2021-06-23 ENCOUNTER — Ambulatory Visit: Payer: Self-pay

## 2021-06-23 ENCOUNTER — Ambulatory Visit (INDEPENDENT_AMBULATORY_CARE_PROVIDER_SITE_OTHER): Payer: Medicare Other | Admitting: Family Medicine

## 2021-06-23 ENCOUNTER — Encounter: Payer: Self-pay | Admitting: Family Medicine

## 2021-06-23 ENCOUNTER — Other Ambulatory Visit: Payer: Self-pay

## 2021-06-23 VITALS — BP 120/92 | HR 73 | Ht 66.0 in | Wt 123.0 lb

## 2021-06-23 DIAGNOSIS — M1712 Unilateral primary osteoarthritis, left knee: Secondary | ICD-10-CM

## 2021-06-23 DIAGNOSIS — M25562 Pain in left knee: Secondary | ICD-10-CM | POA: Diagnosis not present

## 2021-06-23 DIAGNOSIS — G8929 Other chronic pain: Secondary | ICD-10-CM

## 2021-06-23 NOTE — Assessment & Plan Note (Signed)
Baker's cyst aspirated.  Discussed with patient about icing regimen and home exercises.  Patient has significant instability noted of the knee.  We discussed with patient about the possibility of surgical intervention.  Follow-up with me again otherwise in 3 months if necessary.  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

## 2021-06-23 NOTE — Progress Notes (Signed)
[place] Phone: [phone] Subjective:   Abigail [name], am serving as a scribe for Dr. Hulan Saas.  This visit occurred during the SARS-CoV-2 public health emergency.  Safety protocols were in place, including screening questions prior to the visit, additional usage of staff PPE, and extensive cleaning of exam room while observing appropriate contact time as indicated for disinfecting solutions.   I'm seeing this patient by the request  of:  Abigail Gravel, MD  CC: Left knee pain  [id number]  04/20/2021 Known arthritic changes.  We will continue to monitor.  Patient wants to avoid any type of surgical intervention.  Discussed icing regimen and home exercises in which activities to avoid.  Follow-up again in 6 to 8 weeks  Update 06/23/2021 Abigail [name] is a 80 y.o. female coming in with complaint of L knee pain. Patient states that L knee is pain is worse than R. Is here to get Baker's cyst drained. Pain is intermittent.  Patient has known severe bilateral degenerative arthritic changes.     Past Medical History:  Diagnosis Date   Arthritis    oa   Baker's cyst    behind left knee   Constipation    Hemorrhoids    Hypertension    MVP (mitral valve prolapse)    Neoplasm    of appendix   Rosacea    Past Surgical History:  Procedure Laterality Date   AUGMENTATION MAMMAPLASTY Bilateral 1990   saline   CARDIAC CATHETERIZATION  17years ago   small amount of blockage   CATRACTS Bilateral    COLONOSCOPY  2006 and oct 2018   ILEOCECETOMY N/A 08/17/2017   Procedure: OPEN ILEOCECETOMY;  Surgeon: Armandina Gemma, MD;  Location: [facility];  Service: General;  Laterality: N/A;  ERAS PATHWAY   Social History   Socioeconomic History   Marital status: Married    Spouse name: Not on file   Number of children: Not on file   Years of education: Not on file   Highest education level: Not on file  Occupational  History   Not on file  Tobacco Use   Smoking status: Former    Packs/day: 0.50    Years: 10.00    Pack years: 5.00    Types: Cigarettes   Smokeless tobacco: Never   Tobacco comments:    quit 35 yrs ago  Vaping Use   Vaping Use: Never used  Substance and Sexual Activity   Alcohol use: Yes    Alcohol/week: 7.0 standard drinks    Types: 7 Glasses of wine per week    Comment: 1 glass red wine  per night   Drug use: [name]   Sexual activity: Not on file  Other Topics Concern   Not on file  Social History Narrative   Not on file   Social Determinants of Health   Financial Resource Strain: Not on file  Food Insecurity: Not on file  Transportation Needs: Not on file  Physical Activity: Not on file  Stress: Not on file  Social Connections: Not on file   Allergies  Allergen Reactions   Statins Diarrhea and Nausea And Vomiting   Family History  Problem Relation Age of Onset   Colon cancer Cousin        69's maybe     Current Outpatient Medications (Cardiovascular):    ezetimibe (ZETIA) 10 MG tablet, Take 1 tablet (10 mg total) by mouth daily.  losartan (COZAAR) 100 MG tablet, Take 100 mg by mouth daily.   losartan (COZAAR) 25 MG tablet, Take 25 mg by mouth at bedtime.    Current Outpatient Medications (Analgesics):    Acetaminophen-Aspirin Buffered 250-250 MG tablet, Take 0.05 tablets by mouth 2 (two) times daily.   Current Outpatient Medications (Other):    COVID-19 mRNA Vac-TriS, Pfizer, (PFIZER-BIONT COVID-19 VAC-TRIS) SUSP injection, Inject into the muscle.   famotidine-calcium carbonate-magnesium hydroxide (PEPCID COMPLETE) 10-800-165 MG chewable tablet, Chew 1 tablet by mouth daily as needed.   gabapentin (NEURONTIN) 100 MG capsule, Take 2 capsules (200 mg total) by mouth at bedtime.   LORazepam (ATIVAN) 0.5 MG tablet, Take 0.5 mg by mouth 2 (two) times daily as needed for anxiety.   Reviewed prior external information including notes and imaging from  primary  care provider As well as notes that were available from care everywhere and other healthcare systems.  Past medical history, social, surgical and family history all reviewed in electronic medical record.  [name] pertanent information unless stated regarding to the chief complaint.   Review of Systems:  [name] headache, visual changes, nausea, vomiting, diarrhea, constipation, dizziness, abdominal pain, skin rash, fevers, chills, night sweats, weight loss, swollen lymph nodes, body aches, joint swelling, chest pain, shortness of breath, mood changes. POSITIVE muscle aches  Objective  Blood pressure (!) 120/92, pulse 73, height '5\' 6"'$  (1.676 m), weight 123 lb (55.8 kg), SpO2 99 %.   General: [name] apparent distress alert and oriented x3 mood and affect normal, dressed appropriately.  HEENT: Pupils equal, extraocular movements intact  Respiratory: Patient's speak in full sentences and does not appear short of breath  Cardiovascular: [name] lower extremity edema, non tender, [name] erythema  Gait severely antalgic Left knee exam shows significant instability noted with valgus and varus force.  Patient has complete gapping on the medial aspect of the knee.  Significant crepitus noted.  Patient does have significant fullness noted of the popliteal area.  Procedure: Real-time Ultrasound Guided Injection of left Baker's cyst aspiration Device: GE Logiq Q7 Ultrasound guided injection is preferred based studies that show increased duration, increased effect, greater accuracy, decreased procedural pain, increased response rate, and decreased cost with ultrasound guided versus blind injection.  Verbal informed consent obtained.  Time-out conducted.  Noted [name] overlying erythema, induration, or other signs of local infection.  Skin prepped in a sterile fashion.  Local anesthesia: Topical Ethyl chloride.  With sterile technique and under real time ultrasound guidance: With a 22-gauge 2 inch needle patient was injected with 4  cc of 0.5% Marcaine and then aspirated 55 cc of straw light-colored fluid then injected 1 cc of Kenalog 40 mg/mL from posterior approach. Completed without difficulty  Pain immediately resolved suggesting accurate placement of the medication.  Continue to have significant instability Advised to call if fevers/chills, erythema, induration, drainage, or persistent bleeding.  Impression: Technically successful ultrasound guided injection.   Impression and Recommendations:     The above documentation has been reviewed and is accurate and complete [name], DO

## 2021-06-23 NOTE — Patient Instructions (Addendum)
Drained knee today Good to see you Do think we are at the time for replacement Can refer when you want We are here when you need [place]

## 2021-07-09 DIAGNOSIS — E785 Hyperlipidemia, unspecified: Secondary | ICD-10-CM | POA: Diagnosis not present

## 2021-07-09 DIAGNOSIS — Z0000 Encounter for general adult medical examination without abnormal findings: Secondary | ICD-10-CM | POA: Diagnosis not present

## 2021-07-09 DIAGNOSIS — E039 Hypothyroidism, unspecified: Secondary | ICD-10-CM | POA: Diagnosis not present

## 2021-07-12 ENCOUNTER — Other Ambulatory Visit (HOSPITAL_BASED_OUTPATIENT_CLINIC_OR_DEPARTMENT_OTHER): Payer: Self-pay

## 2021-07-14 DIAGNOSIS — E871 Hypo-osmolality and hyponatremia: Secondary | ICD-10-CM | POA: Diagnosis not present

## 2021-07-14 DIAGNOSIS — Z0000 Encounter for general adult medical examination without abnormal findings: Secondary | ICD-10-CM | POA: Diagnosis not present

## 2021-07-14 DIAGNOSIS — I10 Essential (primary) hypertension: Secondary | ICD-10-CM | POA: Diagnosis not present

## 2021-07-14 DIAGNOSIS — M8588 Other specified disorders of bone density and structure, other site: Secondary | ICD-10-CM | POA: Diagnosis not present

## 2021-07-14 DIAGNOSIS — E7800 Pure hypercholesterolemia, unspecified: Secondary | ICD-10-CM | POA: Diagnosis not present

## 2021-07-14 DIAGNOSIS — M810 Age-related osteoporosis without current pathological fracture: Secondary | ICD-10-CM | POA: Diagnosis not present

## 2021-07-14 DIAGNOSIS — I2510 Atherosclerotic heart disease of native coronary artery without angina pectoris: Secondary | ICD-10-CM | POA: Diagnosis not present

## 2021-07-14 DIAGNOSIS — E038 Other specified hypothyroidism: Secondary | ICD-10-CM | POA: Diagnosis not present

## 2021-07-21 ENCOUNTER — Ambulatory Visit: Payer: Self-pay | Attending: Internal Medicine

## 2021-07-21 ENCOUNTER — Ambulatory Visit: Payer: Medicare Other | Admitting: Family Medicine

## 2021-07-21 ENCOUNTER — Other Ambulatory Visit: Payer: Self-pay

## 2021-07-21 ENCOUNTER — Other Ambulatory Visit (HOSPITAL_BASED_OUTPATIENT_CLINIC_OR_DEPARTMENT_OTHER): Payer: Self-pay

## 2021-07-21 DIAGNOSIS — Z23 Encounter for immunization: Secondary | ICD-10-CM

## 2021-07-21 MED ORDER — FLUAD QUADRIVALENT 0.5 ML IM PRSY
PREFILLED_SYRINGE | INTRAMUSCULAR | 0 refills | Status: AC
  Filled 2021-07-21: qty 0.5, 1d supply, fill #0

## 2021-07-21 MED ORDER — PFIZER COVID-19 VAC BIVALENT 30 MCG/0.3ML IM SUSP
INTRAMUSCULAR | 0 refills | Status: AC
  Filled 2021-07-21: qty 0.3, 1d supply, fill #0

## 2021-07-21 NOTE — Progress Notes (Signed)
   Covid-19 Vaccination Clinic  Name:  Abigail [name]    MRN: [id number] DOB: 12-20-1940  07/21/2021  Abigail [name] was observed post Covid-19 immunization for 15 minutes without incident. She was provided with Vaccine Information Sheet and instruction to access the V-Safe system.   Abigail [name] was instructed to call 911 with any severe reactions post vaccine: Difficulty breathing  Swelling of face and throat  A fast heartbeat  A bad rash all over body  Dizziness and weakness

## 2021-08-04 DIAGNOSIS — E559 Vitamin D deficiency, unspecified: Secondary | ICD-10-CM | POA: Diagnosis not present

## 2021-08-04 DIAGNOSIS — I700 Atherosclerosis of aorta: Secondary | ICD-10-CM | POA: Diagnosis not present

## 2021-08-04 DIAGNOSIS — M791 Myalgia, unspecified site: Secondary | ICD-10-CM | POA: Diagnosis not present

## 2021-08-04 DIAGNOSIS — G4700 Insomnia, unspecified: Secondary | ICD-10-CM | POA: Diagnosis not present

## 2021-08-04 DIAGNOSIS — I2510 Atherosclerotic heart disease of native coronary artery without angina pectoris: Secondary | ICD-10-CM | POA: Diagnosis not present

## 2021-08-04 DIAGNOSIS — M810 Age-related osteoporosis without current pathological fracture: Secondary | ICD-10-CM | POA: Diagnosis not present

## 2021-08-04 DIAGNOSIS — T466X5A Adverse effect of antihyperlipidemic and antiarteriosclerotic drugs, initial encounter: Secondary | ICD-10-CM | POA: Diagnosis not present

## 2021-09-21 NOTE — Progress Notes (Signed)
[place] Phone: [phone] Subjective:   [name], am serving as a scribe for Dr. Hulan Saas.  This visit occurred during the SARS-CoV-2 public health emergency.  Safety protocols were in place, including screening questions prior to the visit, additional usage of staff PPE, and extensive cleaning of exam room while observing appropriate contact time as indicated for disinfecting solutions.    I'm seeing this patient by the request  of:  [name], MD  CC: Left knee pain and swelling  [id number]  06/23/2021 Baker's cyst aspirated.  Discussed with patient about icing regimen and home exercises.  Patient has significant instability noted of the knee.  We discussed with patient about the possibility of surgical intervention.  Follow-up with me again otherwise in 3 months if necessary.  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  Update 09/22/2021 ANALENA GAMA is a 80 y.o. female coming in with complaint of B knee pain.  Patient has had recurrent accumulation of Baker's cyst.  The last time was aspirated 3 months ago.  Patient states that L knee has been achy.   Also complains of chronic R knee pain.    Was found to have low Vit D and had a bone density scan. Using Rx strength Vit D. Was recommended that she get Prolia injections.   Past Medical History:  Diagnosis Date   Arthritis    oa   Baker's cyst    behind left knee   Constipation    Hemorrhoids    Hypertension    MVP (mitral valve prolapse)    Neoplasm    of appendix   Rosacea    Past Surgical History:  Procedure Laterality Date   AUGMENTATION MAMMAPLASTY Bilateral 1990   saline   CARDIAC CATHETERIZATION  17years ago   small amount of blockage   CATRACTS Bilateral    COLONOSCOPY  2006 and oct 2018   ILEOCECETOMY N/A 08/17/2017   Procedure: OPEN ILEOCECETOMY;  Surgeon: Armandina Gemma, MD;   Location: [facility];  Service: General;  Laterality: N/A;  ERAS PATHWAY   Social History   Socioeconomic History   Marital status: Married    Spouse name: Not on file   Number of children: Not on file   Years of education: Not on file   Highest education level: Not on file  Occupational History   Not on file  Tobacco Use   Smoking status: Former    Packs/day: 0.50    Years: 10.00    Pack years: 5.00    Types: Cigarettes   Smokeless tobacco: Never   Tobacco comments:    quit 35 yrs ago  Vaping Use   Vaping Use: Never used  Substance and Sexual Activity   Alcohol use: Yes    Alcohol/week: 7.0 standard drinks    Types: 7 Glasses of wine per week    Comment: 1 glass red wine  per night   Drug use: No   Sexual activity: Not on file  Other Topics Concern   Not on file  Social History Narrative   Not on file   Social Determinants of Health   Financial Resource Strain: Not on file  Food Insecurity: Not on file  Transportation Needs: Not on file  Physical Activity: Not on file  Stress: Not on file  Social Connections: Not on file   Allergies  Allergen Reactions   Statins Diarrhea  and Nausea And Vomiting   Family History  Problem Relation Age of Onset   Colon cancer Cousin        64's maybe     Current Outpatient Medications (Cardiovascular):    ezetimibe (ZETIA) 10 MG tablet, Take 1 tablet (10 mg total) by mouth daily.   losartan (COZAAR) 100 MG tablet, Take 100 mg by mouth daily.   losartan (COZAAR) 25 MG tablet, Take 25 mg by mouth at bedtime.    Current Outpatient Medications (Analgesics):    Acetaminophen-Aspirin Buffered 250-250 MG tablet, Take 0.05 tablets by mouth 2 (two) times daily.   Current Outpatient Medications (Other):    COVID-19 mRNA bivalent vaccine, Pfizer, (PFIZER COVID-19 VAC BIVALENT) injection, Inject into the muscle.   COVID-19 mRNA Vac-TriS, Pfizer, (PFIZER-BIONT COVID-19 VAC-TRIS) SUSP injection, Inject into the muscle.    famotidine-calcium carbonate-magnesium hydroxide (PEPCID COMPLETE) 10-800-165 MG chewable tablet, Chew 1 tablet by mouth daily as needed.   gabapentin (NEURONTIN) 100 MG capsule, Take 2 capsules (200 mg total) by mouth at bedtime.   influenza vaccine adjuvanted (FLUAD QUADRIVALENT) 0.5 ML injection, Inject into the muscle.   LORazepam (ATIVAN) 0.5 MG tablet, Take 0.5 mg by mouth 2 (two) times daily as needed for anxiety.   Vitamin D, Ergocalciferol, (DRISDOL) 1.25 MG (50000 UNIT) CAPS capsule, Take 50,000 Units by mouth every 7 (seven) days.   Reviewed prior external information including notes and imaging from  primary care provider As well as notes that were available from care everywhere and other healthcare systems.  Past medical history, social, surgical and family history all reviewed in electronic medical record.  No pertanent information unless stated regarding to the chief complaint.   Review of Systems:  No headache, visual changes, nausea, vomiting, diarrhea, constipation, dizziness, abdominal pain, skin rash, fevers, chills, night sweats, weight loss, swollen lymph nodes, body aches, chest pain, shortness of breath, mood changes. POSITIVE muscle aches, joint swelling  Objective  Blood pressure 132/84, pulse 79, height 5\' 6"  (1.676 m), weight 123 lb (55.8 kg), SpO2 97 %.   General: No apparent distress alert and oriented x3 mood and affect normal, dressed appropriately.  HEENT: Pupils equal, extraocular movements intact  Respiratory: Patient's speak in full sentences and does not appear short of breath  Cardiovascular: No lower extremity edema, non tender, no erythema  Gait antalgic MSK: Significant scoliosis noted at baseline Left knee exam does have significant swelling of the Baker's cyst noted.  Patient is tender to palpation of knee diffusely.  Procedure: Real-time Ultrasound Guided Injection of left knee Baker's cyst Device: GE Logiq Q7 Ultrasound guided injection is  preferred based studies that show increased duration, increased effect, greater accuracy, decreased procedural pain, increased response rate, and decreased cost with ultrasound guided versus blind injection.  Verbal informed consent obtained.  Time-out conducted.  Noted no overlying erythema, induration, or other signs of local infection.  Skin prepped in a sterile fashion.  Local anesthesia: Topical Ethyl chloride.  With sterile technique and under real time ultrasound guidance: With a 22-gauge 2 inch needle patient was injected with 4 cc of 0.5% Marcaine and aspirated 50 cc of straw light-colored fluid then injected 1 cc of Kenalog 40 mg/mL into the knee from a posterior approach. Completed without difficulty  Pain immediately resolved suggesting accurate placement of the medication.  Advised to call if fevers/chills, erythema, induration, drainage, or persistent bleeding.    Impression: Technically successful ultrasound guided injection.   Impression and Recommendations:     The  above documentation has been reviewed and is accurate and complete [name], DO

## 2021-09-22 ENCOUNTER — Encounter: Payer: Self-pay | Admitting: Family Medicine

## 2021-09-22 ENCOUNTER — Other Ambulatory Visit: Payer: Self-pay | Admitting: Family Medicine

## 2021-09-22 ENCOUNTER — Ambulatory Visit: Payer: Self-pay

## 2021-09-22 ENCOUNTER — Ambulatory Visit (INDEPENDENT_AMBULATORY_CARE_PROVIDER_SITE_OTHER): Payer: Medicare Other | Admitting: Family Medicine

## 2021-09-22 ENCOUNTER — Other Ambulatory Visit: Payer: Self-pay

## 2021-09-22 ENCOUNTER — Ambulatory Visit: Payer: Medicare Other

## 2021-09-22 VITALS — BP 132/84 | HR 79 | Ht 66.0 in | Wt 123.0 lb

## 2021-09-22 DIAGNOSIS — G8929 Other chronic pain: Secondary | ICD-10-CM

## 2021-09-22 DIAGNOSIS — M25562 Pain in left knee: Secondary | ICD-10-CM

## 2021-09-22 DIAGNOSIS — M810 Age-related osteoporosis without current pathological fracture: Secondary | ICD-10-CM | POA: Insufficient documentation

## 2021-09-22 DIAGNOSIS — M7122 Synovial cyst of popliteal space [Baker], left knee: Secondary | ICD-10-CM | POA: Diagnosis not present

## 2021-09-22 DIAGNOSIS — M816 Localized osteoporosis [Lequesne]: Secondary | ICD-10-CM | POA: Diagnosis not present

## 2021-09-22 DIAGNOSIS — M1711 Unilateral primary osteoarthritis, right knee: Secondary | ICD-10-CM

## 2021-09-22 DIAGNOSIS — M1712 Unilateral primary osteoarthritis, left knee: Secondary | ICD-10-CM

## 2021-09-22 NOTE — Patient Instructions (Signed)
PT [facility] Consider Reclast if you do not do Prolia Calcium would consider 600mg  at day and consider Tums if it doesn't taste bad to you Vit D for 12 weeks and goal around 50 on labs See me in 3 months

## 2021-09-22 NOTE — Assessment & Plan Note (Signed)
Patient does have osteoporosis and appears on her last bone scan.  Do recommend with patient having a low vitamin D to continue the vitamin D supplementation that she was given per her primary care provider.  In addition to this patient did bring up Prolia.  I do think that this would be a good medicine for her but she does not want to be on the medication lifelong.  We did give her the name Reclast as a possibility of another medication.  Patient is going to talk to the pharmacist and her provider about this.  Discussed the importance of the calcium supplementation as well.  Patient should follow-up with other providers for this problem.

## 2021-09-22 NOTE — Assessment & Plan Note (Signed)
Aspiration done today, tolerated the procedure well, discussed icing regimen and home exercises, discussed which activities to do which wants to avoid.  Increase activity slowly.  Follow-up again in 8 to 12 weeks.  Patient still wants to avoid any surgical intervention if possible.

## 2021-10-05 DIAGNOSIS — I700 Atherosclerosis of aorta: Secondary | ICD-10-CM | POA: Diagnosis not present

## 2021-10-05 DIAGNOSIS — M810 Age-related osteoporosis without current pathological fracture: Secondary | ICD-10-CM | POA: Diagnosis not present

## 2021-10-05 DIAGNOSIS — I2510 Atherosclerotic heart disease of native coronary artery without angina pectoris: Secondary | ICD-10-CM | POA: Diagnosis not present

## 2021-10-05 DIAGNOSIS — E871 Hypo-osmolality and hyponatremia: Secondary | ICD-10-CM | POA: Diagnosis not present

## 2021-10-05 DIAGNOSIS — M791 Myalgia, unspecified site: Secondary | ICD-10-CM | POA: Diagnosis not present

## 2021-10-05 DIAGNOSIS — E038 Other specified hypothyroidism: Secondary | ICD-10-CM | POA: Diagnosis not present

## 2021-10-05 DIAGNOSIS — R7989 Other specified abnormal findings of blood chemistry: Secondary | ICD-10-CM | POA: Diagnosis not present

## 2021-10-05 DIAGNOSIS — T466X5A Adverse effect of antihyperlipidemic and antiarteriosclerotic drugs, initial encounter: Secondary | ICD-10-CM | POA: Diagnosis not present

## 2021-10-05 DIAGNOSIS — Z79899 Other long term (current) drug therapy: Secondary | ICD-10-CM | POA: Diagnosis not present

## 2021-10-05 DIAGNOSIS — G4700 Insomnia, unspecified: Secondary | ICD-10-CM | POA: Diagnosis not present

## 2021-10-05 DIAGNOSIS — E559 Vitamin D deficiency, unspecified: Secondary | ICD-10-CM | POA: Diagnosis not present

## 2021-12-20 NOTE — Progress Notes (Signed)
Abigail [name] [address] [place] Phone: [phone] Subjective:   Abigail [name], am serving as a scribe for Dr. Hulan Saas. This visit occurred during the SARS-CoV-2 public health emergency.  Safety protocols were in place, including screening questions prior to the visit, additional usage of staff PPE, and extensive cleaning of exam room while observing appropriate contact time as indicated for disinfecting solutions.   I'm seeing this patient by the request  of:  [name], MD  CC: bilateral knee   [id number]  09/22/2021 Patient does have osteoporosis and appears on her last bone scan.  Do recommend with patient having a low vitamin D to continue the vitamin D supplementation that she was given per her primary care provider.  In addition to this patient did bring up Prolia.  I do think that this would be a good medicine for her but she does not want to be on the medication lifelong.  We did give her the name Reclast as a possibility of another medication.  Patient is going to talk to the pharmacist and her provider about this.  Discussed the importance of the calcium supplementation as well.  Patient should follow-up with other providers for this problem.  Aspiration done today, tolerated the procedure well, discussed icing regimen and home exercises, discussed which activities to do which wants to avoid.  Increase activity slowly.  Follow-up again in 8 to 12 weeks.  Patient still wants to avoid any surgical intervention if possible.   Update 12/21/2021 Abigail [name] is a 81 y.o. female coming in with complaint of B knee pain. Was suppose to begin PT through [facility]. Patient states pain is about the same. Taking Vit D in the morning feels like she has more aches.      Past Medical History:  Diagnosis Date   Arthritis    oa   Baker's cyst    behind left knee   Constipation    Hemorrhoids    Hypertension    MVP (mitral valve  prolapse)    Neoplasm    of appendix   Rosacea    Past Surgical History:  Procedure Laterality Date   AUGMENTATION MAMMAPLASTY Bilateral 1990   saline   CARDIAC CATHETERIZATION  17years ago   small amount of blockage   CATRACTS Bilateral    COLONOSCOPY  2006 and oct 2018   ILEOCECETOMY N/A 08/17/2017   Procedure: OPEN ILEOCECETOMY;  Surgeon: Armandina Gemma, MD;  Location: [facility];  Service: General;  Laterality: N/A;  ERAS PATHWAY   Social History   Socioeconomic History   Marital status: Married    Spouse name: Not on file   Number of children: Not on file   Years of education: Not on file   Highest education level: Not on file  Occupational History   Not on file  Tobacco Use   Smoking status: Former    Packs/day: 0.50    Years: 10.00    Pack years: 5.00    Types: Cigarettes   Smokeless tobacco: Never   Tobacco comments:    quit 35 yrs ago  Vaping Use   Vaping Use: Never used  Substance and Sexual Activity   Alcohol use: Yes    Alcohol/week: 7.0 standard drinks    Types: 7 Glasses of wine per week    Comment: 1 glass red wine  per night   Drug use: No   Sexual activity: Not on file  Other Topics  Concern   Not on file  Social History Narrative   Not on file   Social Determinants of Health   Financial Resource Strain: Not on file  Food Insecurity: Not on file  Transportation Needs: Not on file  Physical Activity: Not on file  Stress: Not on file  Social Connections: Not on file   Allergies  Allergen Reactions   Statins Diarrhea and Nausea And Vomiting   Family History  Problem Relation Age of Onset   Colon cancer Cousin        41's maybe     Current Outpatient Medications (Cardiovascular):    ezetimibe (ZETIA) 10 MG tablet, Take 1 tablet (10 mg total) by mouth daily.   losartan (COZAAR) 100 MG tablet, Take 100 mg by mouth daily.   losartan (COZAAR) 25 MG tablet, Take 25 mg by mouth at bedtime.    Current Outpatient Medications (Analgesics):     Acetaminophen-Aspirin Buffered 250-250 MG tablet, Take 0.05 tablets by mouth 2 (two) times daily.   Current Outpatient Medications (Other):    COVID-19 mRNA bivalent vaccine, Pfizer, (PFIZER COVID-19 VAC BIVALENT) injection, Inject into the muscle.   COVID-19 mRNA Vac-TriS, Pfizer, (PFIZER-BIONT COVID-19 VAC-TRIS) SUSP injection, Inject into the muscle.   famotidine-calcium carbonate-magnesium hydroxide (PEPCID COMPLETE) 10-800-165 MG chewable tablet, Chew 1 tablet by mouth daily as needed.   gabapentin (NEURONTIN) 100 MG capsule, Take 2 capsules (200 mg total) by mouth at bedtime.   influenza vaccine adjuvanted (FLUAD QUADRIVALENT) 0.5 ML injection, Inject into the muscle.   LORazepam (ATIVAN) 0.5 MG tablet, Take 0.5 mg by mouth 2 (two) times daily as needed for anxiety.   Vitamin D, Ergocalciferol, (DRISDOL) 1.25 MG (50000 UNIT) CAPS capsule, Take 50,000 Units by mouth every 7 (seven) days.   Reviewed prior external information including notes and imaging from  primary care provider As well as notes that were available from care everywhere and other healthcare systems.  Past medical history, social, surgical and family history all reviewed in electronic medical record.  No pertanent information unless stated regarding to the chief complaint.   Review of Systems:  No headache, visual changes, nausea, vomiting, diarrhea, constipation, dizziness, abdominal pain, skin rash, fevers, chills, night sweats, weight loss, swollen lymph nodes, body aches, joint swelling, chest pain, shortness of breath, mood changes. POSITIVE muscle aches  Objective  Blood pressure 124/86, pulse 67, height 5\' 6"  (1.676 m), weight 125 lb (56.7 kg), SpO2 99 %.   General: No apparent distress alert and oriented x3 mood and affect normal, dressed appropriately.  HEENT: Pupils equal, extraocular movements intact  Respiratory: Patient's speak in full sentences and does not appear short of breath  Cardiovascular: No  lower extremity edema, non tender, no erythema  Gait antalgic gait noted. Left knee instability with varus and valgus   Procedure: Real-time Ultrasound Guided Injection of left knee Baker's cyst Device: GE Logiq Q7 Ultrasound guided injection is preferred based studies that show increased duration, increased effect, greater accuracy, decreased procedural pain, increased response rate, and decreased cost with ultrasound guided versus blind injection.  Verbal informed consent obtained.  Time-out conducted.  Noted no overlying erythema, induration, or other signs of local infection.  Skin prepped in a sterile fashion.  Local anesthesia: Topical Ethyl chloride.  With sterile technique and under real time ultrasound guidance: With a 22-gauge 2 inch needle patient was injected with 4 cc of 0.5% Marcaine and aspirated and greater than 60 cc of straw like colored fluid and then injected 1  cc of Kenalog 40 mg/dL. This was from a posterior  approach.  Completed without difficulty  Pain immediately resolved suggesting accurate placement of the medication.  Advised to call if fevers/chills, erythema, induration, drainage, or persistent bleeding.  Impression: Technically successful ultrasound guided injection.     Impression and Recommendations:     The above documentation has been reviewed and is accurate and complete [name], DO

## 2021-12-21 ENCOUNTER — Other Ambulatory Visit: Payer: Self-pay

## 2021-12-21 ENCOUNTER — Encounter: Payer: Self-pay | Admitting: Family Medicine

## 2021-12-21 ENCOUNTER — Ambulatory Visit: Payer: Self-pay

## 2021-12-21 ENCOUNTER — Ambulatory Visit (INDEPENDENT_AMBULATORY_CARE_PROVIDER_SITE_OTHER): Payer: Medicare Other | Admitting: Family Medicine

## 2021-12-21 VITALS — BP 124/86 | HR 67 | Ht 66.0 in | Wt 125.0 lb

## 2021-12-21 DIAGNOSIS — M7122 Synovial cyst of popliteal space [Baker], left knee: Secondary | ICD-10-CM | POA: Diagnosis not present

## 2021-12-21 NOTE — Assessment & Plan Note (Signed)
Patient had repeat aspiration done today.  Discussed icing regimen and home exercises, we discussed that patient does have severe arthritic changes.  Still wants to avoid any surgical intervention if she can.  Patient can continue with more compression sleeve.  Increase activity slowly.  Follow-up again in 6 to 8 weeks.

## 2021-12-21 NOTE — Patient Instructions (Signed)
Drained cyst today Good to see you! See you again in 6 weeks just in case for right knee If doing well See you again in 3 months

## 2022-02-01 ENCOUNTER — Ambulatory Visit: Payer: Medicare Other | Admitting: Family Medicine

## 2022-02-16 ENCOUNTER — Other Ambulatory Visit: Payer: Self-pay | Admitting: Registered Nurse

## 2022-02-16 DIAGNOSIS — Z1231 Encounter for screening mammogram for malignant neoplasm of breast: Secondary | ICD-10-CM

## 2022-03-03 ENCOUNTER — Ambulatory Visit
Admission: RE | Admit: 2022-03-03 | Discharge: 2022-03-03 | Disposition: A | Discharge status: Home / Self Care | Payer: Medicare Other | Source: Ambulatory Visit | Attending: Registered Nurse | Admitting: Registered Nurse

## 2022-03-03 DIAGNOSIS — Z1231 Encounter for screening mammogram for malignant neoplasm of breast: Secondary | ICD-10-CM

## 2022-03-04 ENCOUNTER — Other Ambulatory Visit: Payer: Self-pay | Admitting: Registered Nurse

## 2022-03-04 DIAGNOSIS — N63 Unspecified lump in unspecified breast: Secondary | ICD-10-CM

## 2022-03-22 NOTE — Progress Notes (Unsigned)
[name] Phone: [phone] Subjective:   [name], am serving as a scribe for Dr. Hulan Saas.   I'm seeing this patient by the request  of:  Pcp, No  CC: Left knee pain and swelling  [id number]  12/21/2021 Patient had repeat aspiration done today.  Discussed icing regimen and home exercises, we discussed that patient does have severe arthritic changes.  Still wants to avoid any surgical intervention if she can.  Patient can continue with more compression sleeve.  Increase activity slowly.  Follow-up again in 6 to 8 weeks.  Update 03/23/2022 Abigail [name] is a 81 y.o. female coming in with complaint of L knee pain. Patient states that achiness in L knee is increasing recently. Left ankle pain increases when knee pain increases.  Known Baker's cyst and severe arthritic changes. Wants to hold off on any type of surgical intervention.  Patient feels though that the aspiration has helped out significantly.     Past Medical History:  Diagnosis Date   Arthritis    oa   Baker's cyst    behind left knee   Constipation    Hemorrhoids    Hypertension    MVP (mitral valve prolapse)    Neoplasm    of appendix   Rosacea    Past Surgical History:  Procedure Laterality Date   AUGMENTATION MAMMAPLASTY Bilateral 1990   saline   CARDIAC CATHETERIZATION  17years ago   small amount of blockage   CATRACTS Bilateral    COLONOSCOPY  2006 and oct 2018   ILEOCECETOMY N/A 08/17/2017   Procedure: OPEN ILEOCECETOMY;  Surgeon: Armandina Gemma, MD;  Location: [facility];  Service: General;  Laterality: N/A;  ERAS PATHWAY   Social History   Socioeconomic History   Marital status: Married    Spouse name: Not on file   Number of children: Not on file   Years of education: Not on file   Highest education level: Not on file  Occupational History   Not on file  Tobacco Use   Smoking status: Former    Packs/day: 0.50     Years: 10.00    Pack years: 5.00    Types: Cigarettes   Smokeless tobacco: Never   Tobacco comments:    quit 35 yrs ago  Vaping Use   Vaping Use: Never used  Substance and Sexual Activity   Alcohol use: Yes    Alcohol/week: 7.0 standard drinks    Types: 7 Glasses of wine per week    Comment: 1 glass red wine  per night   Drug use: No   Sexual activity: Not on file  Other Topics Concern   Not on file  Social History Narrative   Not on file   Social Determinants of Health   Financial Resource Strain: Not on file  Food Insecurity: Not on file  Transportation Needs: Not on file  Physical Activity: Not on file  Stress: Not on file  Social Connections: Not on file   Allergies  Allergen Reactions   Statins Diarrhea and Nausea And Vomiting   Family History  Problem Relation Age of Onset   Colon cancer Cousin        49's maybe     Current Outpatient Medications (Cardiovascular):    losartan (COZAAR) 100 MG tablet, Take 100 mg by mouth daily.   ezetimibe (ZETIA) 10 MG tablet, Take 1 tablet (10 mg total) by mouth daily.  losartan (COZAAR) 25 MG tablet, Take 25 mg by mouth at bedtime.    Current Outpatient Medications (Analgesics):    Acetaminophen-Aspirin Buffered 250-250 MG tablet, Take 0.05 tablets by mouth 2 (two) times daily.   Current Outpatient Medications (Other):    COVID-19 mRNA bivalent vaccine, Pfizer, (PFIZER COVID-19 VAC BIVALENT) injection, Inject into the muscle.   COVID-19 mRNA Vac-TriS, Pfizer, (PFIZER-BIONT COVID-19 VAC-TRIS) SUSP injection, Inject into the muscle.   famotidine-calcium carbonate-magnesium hydroxide (PEPCID COMPLETE) 10-800-165 MG chewable tablet, Chew 1 tablet by mouth daily as needed.   gabapentin (NEURONTIN) 100 MG capsule, Take 2 capsules (200 mg total) by mouth at bedtime.   influenza vaccine adjuvanted (FLUAD QUADRIVALENT) 0.5 ML injection, Inject into the muscle.   LORazepam (ATIVAN) 0.5 MG tablet, Take 0.5 mg by mouth 2 (two)  times daily as needed for anxiety.   Vitamin D, Ergocalciferol, (DRISDOL) 1.25 MG (50000 UNIT) CAPS capsule, Take 50,000 Units by mouth every 7 (seven) days.   Reviewed prior external information including notes and imaging from  primary care provider As well as notes that were available from care everywhere and other healthcare systems.  Past medical history, social, surgical and family history all reviewed in electronic medical record.  No pertanent information unless stated regarding to the chief complaint.   Review of Systems:  No headache, visual changes, nausea, vomiting, diarrhea, constipation, dizziness, abdominal pain, skin rash, fevers, chills, night sweats, weight loss, swollen lymph nodes, body aches, joint swelling, chest pain, shortness of breath, mood changes. POSITIVE muscle aches  Objective  Blood pressure (!) 138/92, pulse 64, height '5\' 6"'$  (1.676 m), weight 125 lb (56.7 kg), SpO2 95 %.   General: No apparent distress alert and oriented x3 mood and affect normal, dressed appropriately.  HEENT: Pupils equal, extraocular movements intact  Respiratory: Patient's speak in full sentences and does not appear short of breath  Cardiovascular: No lower extremity edema, non tender, no erythema  Gait severely antalgic gait noted.  The patient's left knee does have significant fullness noted in the popliteal area.  Lacks the last 15 degrees of flexion of the knee.  Instability noted with valgus and varus force.  Procedure: Real-time Ultrasound Guided Injection of left knee Device: GE Logiq Q7 Ultrasound guided injection is preferred based studies that show increased duration, increased effect, greater accuracy, decreased procedural pain, increased response rate, and decreased cost with ultrasound guided versus blind injection.  Verbal informed consent obtained.  Time-out conducted.  Noted no overlying erythema, induration, or other signs of local infection.  Skin prepped in a sterile  fashion.  Local anesthesia: Topical Ethyl chloride.  With sterile technique and under real time ultrasound guidance: With a 22-gauge 2 inch needle patient was injected with 4 cc of 0.5% Marcaine and then aspirated 50 cc of straw light-colored fluid and then injected 1 cc of Kenalog 40 mg/mL from a posterior approach Completed without difficulty  Pain immediately resolved suggesting accurate placement of the medication.  Advised to call if fevers/chills, erythema, induration, drainage, or persistent bleeding.  Impression: Technically successful ultrasound guided injection.    Impression and Recommendations:    The above documentation has been reviewed and is accurate and complete [name], DO

## 2022-03-23 ENCOUNTER — Ambulatory Visit (INDEPENDENT_AMBULATORY_CARE_PROVIDER_SITE_OTHER): Payer: Medicare Other | Admitting: Family Medicine

## 2022-03-23 ENCOUNTER — Encounter: Payer: Self-pay | Admitting: Family Medicine

## 2022-03-23 ENCOUNTER — Ambulatory Visit: Payer: Self-pay

## 2022-03-23 VITALS — BP 138/92 | HR 64 | Ht 66.0 in | Wt 125.0 lb

## 2022-03-23 DIAGNOSIS — M25562 Pain in left knee: Secondary | ICD-10-CM | POA: Diagnosis not present

## 2022-03-23 DIAGNOSIS — M1712 Unilateral primary osteoarthritis, left knee: Secondary | ICD-10-CM | POA: Diagnosis not present

## 2022-03-23 DIAGNOSIS — G8929 Other chronic pain: Secondary | ICD-10-CM

## 2022-03-23 DIAGNOSIS — M7122 Synovial cyst of popliteal space [Baker], left knee: Secondary | ICD-10-CM | POA: Diagnosis not present

## 2022-03-23 NOTE — Assessment & Plan Note (Signed)
Chronic problem with worsening symptoms.  Does have fairly severe arthritic changes as well.  Does have bilateral knee pain.  Aspiration of the Baker's cyst.  Discussed icing regimen and home exercises.  He can repeat every 3 months if necessary.  Patient wants to continue to be active.  Encouraged her to wear the compression.  Follow-up with me again in 6 to 12 weeks

## 2022-03-23 NOTE — Patient Instructions (Addendum)
Drained L knee today See me again in 3 months Wear compression daily for 1 week Check into [employer]

## 2022-03-25 ENCOUNTER — Other Ambulatory Visit: Payer: Medicare Other

## 2022-04-11 ENCOUNTER — Ambulatory Visit
Admission: RE | Admit: 2022-04-11 | Discharge: 2022-04-11 | Disposition: A | Discharge status: Home / Self Care | Payer: Medicare Other | Source: Ambulatory Visit | Attending: Registered Nurse | Admitting: Registered Nurse

## 2022-04-11 DIAGNOSIS — N63 Unspecified lump in unspecified breast: Secondary | ICD-10-CM

## 2022-04-11 DIAGNOSIS — N6489 Other specified disorders of breast: Secondary | ICD-10-CM | POA: Diagnosis not present

## 2022-04-11 DIAGNOSIS — R928 Other abnormal and inconclusive findings on diagnostic imaging of breast: Secondary | ICD-10-CM | POA: Diagnosis not present

## 2022-05-11 NOTE — Progress Notes (Signed)
Abigail [name] [address] [place] Phone: [phone] Subjective:   Abigail [name], am serving as a scribe for Dr. Hulan Saas.  I'm seeing this patient by the request  of:  Pcp, No  CC: Right knee pain  [id number]  03/23/2022 Chronic problem with worsening symptoms.  Does have fairly severe arthritic changes as well.  Does have bilateral knee pain.  Aspiration of the Baker's cyst.  Discussed icing regimen and home exercises.  He can repeat every 3 months if necessary.  Patient wants to continue to be active.  Encouraged her to wear the compression.  Follow-up with me again in 6 to 12 weeks  Update 05/13/2022 Abigail [name] is a 81 y.o. female coming in with complaint of R knee pain. Patient states left knee doing well. Right knee is bothersome. Anterior medial side pain. Posterior to the patella.       Past Medical History:  Diagnosis Date   Arthritis    oa   Baker's cyst    behind left knee   Constipation    Hemorrhoids    Hypertension    MVP (mitral valve prolapse)    Neoplasm    of appendix   Rosacea    Past Surgical History:  Procedure Laterality Date   AUGMENTATION MAMMAPLASTY Bilateral 1990   saline   CARDIAC CATHETERIZATION  17years ago   small amount of blockage   CATRACTS Bilateral    COLONOSCOPY  2006 and oct 2018   ILEOCECETOMY N/A 08/17/2017   Procedure: OPEN ILEOCECETOMY;  Surgeon: Armandina Gemma, MD;  Location: [facility];  Service: General;  Laterality: N/A;  ERAS PATHWAY   Social History   Socioeconomic History   Marital status: Married    Spouse name: Not on file   Number of children: Not on file   Years of education: Not on file   Highest education level: Not on file  Occupational History   Not on file  Tobacco Use   Smoking status: Former    Packs/day: 0.50    Years: 10.00    Total pack years: 5.00    Types: Cigarettes   Smokeless tobacco: Never   Tobacco comments:    quit 35 yrs ago   Vaping Use   Vaping Use: Never used  Substance and Sexual Activity   Alcohol use: Yes    Alcohol/week: 7.0 standard drinks of alcohol    Types: 7 Glasses of wine per week    Comment: 1 glass red wine  per night   Drug use: No   Sexual activity: Not on file  Other Topics Concern   Not on file  Social History Narrative   Not on file   Social Determinants of Health   Financial Resource Strain: Not on file  Food Insecurity: Not on file  Transportation Needs: Not on file  Physical Activity: Not on file  Stress: Not on file  Social Connections: Not on file   Allergies  Allergen Reactions   Statins Diarrhea and Nausea And Vomiting   Family History  Problem Relation Age of Onset   Colon cancer Cousin        70's maybe     Current Outpatient Medications (Cardiovascular):    ezetimibe (ZETIA) 10 MG tablet, Take 1 tablet (10 mg total) by mouth daily.   losartan (COZAAR) 100 MG tablet, Take 100 mg by mouth daily.   losartan (COZAAR) 25 MG tablet, Take 25 mg by mouth at bedtime.  Current Outpatient Medications (Analgesics):    Acetaminophen-Aspirin Buffered 250-250 MG tablet, Take 0.05 tablets by mouth 2 (two) times daily.   Current Outpatient Medications (Other):    COVID-19 mRNA bivalent vaccine, Pfizer, (PFIZER COVID-19 VAC BIVALENT) injection, Inject into the muscle.   COVID-19 mRNA Vac-TriS, Pfizer, (PFIZER-BIONT COVID-19 VAC-TRIS) SUSP injection, Inject into the muscle.   famotidine-calcium carbonate-magnesium hydroxide (PEPCID COMPLETE) 10-800-165 MG chewable tablet, Chew 1 tablet by mouth daily as needed.   gabapentin (NEURONTIN) 100 MG capsule, Take 2 capsules (200 mg total) by mouth at bedtime.   influenza vaccine adjuvanted (FLUAD QUADRIVALENT) 0.5 ML injection, Inject into the muscle.   LORazepam (ATIVAN) 0.5 MG tablet, Take 0.5 mg by mouth 2 (two) times daily as needed for anxiety.   Vitamin D, Ergocalciferol, (DRISDOL) 1.25 MG (50000 UNIT) CAPS capsule, Take  50,000 Units by mouth every 7 (seven) days.   Reviewed prior external information including notes and imaging from  primary care provider As well as notes that were available from care everywhere and other healthcare systems.  Past medical history, social, surgical and family history all reviewed in electronic medical record.  No pertanent information unless stated regarding to the chief complaint.   Review of Systems:  No headache, visual changes, nausea, vomiting, diarrhea, constipation, dizziness, abdominal pain, skin rash, fevers, chills, night sweats, weight loss, swollen lymph nodes, body aches,  chest pain, shortness of breath, mood changes. POSITIVE muscle aches, joint swelling  Objective  Blood pressure 124/90, pulse 75, height '5\' 6"'$  (1.676 m), weight 119 lb (54 kg), SpO2 96 %.   General: No apparent distress alert and oriented x3 mood and affect normal, dressed appropriately.  HEENT: Pupils equal, extraocular movements intact  Respiratory: Patient's speak in full sentences and does not appear short of breath  Cardiovascular: No lower extremity edema, non tender, no erythema  Severe arthritic changes of multiple joints. Patient's right knee does have arthritic changes.  Instability noted bilaterally.  Patient has more instability of the right knee.  Tender to palpation over the medial joint line  Limited muscular skeletal ultrasound was performed and interpreted by Hulan Saas, M  Patient does have a hypoechoic changes of the posterior medial aspect that is consistent with potential cyst formation.  With compression this seems to be more ganglion true fluid. Impression: Ganglion cyst with significant arthritic changes otherwise.   Procedure: Real-time Ultrasound Guided Injection of left knee cyst Device: GE Logiq Q7 Ultrasound guided injection is preferred based studies that show increased duration, increased effect, greater accuracy, decreased procedural pain, increased  response rate, and decreased cost with ultrasound guided versus blind injection.  Verbal informed consent obtained.  Time-out conducted.  Noted no overlying erythema, induration, or other signs of local infection.  Skin prepped in a sterile fashion.  Local anesthesia: Topical Ethyl chloride.  With sterile technique and under real time ultrasound guidance: With a 18-gauge 1-1/2 inch needle patient was injected with 0.5 cc of 0.5% Marcaine and then aspirated 17 cc of gel-like fluid with synovial fluid.  Patient then injected 1 cc of Kenalog 40 mg per mill. Completed without difficulty  Pain immediately resolved suggesting accurate placement of the medication.  Advised to call if fevers/chills, erythema, induration, drainage, or persistent bleeding.  Impression: Technically successful ultrasound guided injection.   Impression and Recommendations:     The above documentation has been reviewed and is accurate and complete [name], DO

## 2022-05-13 ENCOUNTER — Encounter: Payer: Self-pay | Admitting: Family Medicine

## 2022-05-13 ENCOUNTER — Ambulatory Visit (INDEPENDENT_AMBULATORY_CARE_PROVIDER_SITE_OTHER): Payer: Medicare Other | Admitting: Family Medicine

## 2022-05-13 ENCOUNTER — Ambulatory Visit: Payer: Self-pay

## 2022-05-13 VITALS — BP 124/90 | HR 75 | Ht 66.0 in | Wt 119.0 lb

## 2022-05-13 DIAGNOSIS — M1711 Unilateral primary osteoarthritis, right knee: Secondary | ICD-10-CM

## 2022-05-13 DIAGNOSIS — M25561 Pain in right knee: Secondary | ICD-10-CM

## 2022-05-13 NOTE — Assessment & Plan Note (Signed)
Aspiration done today.  Discussed icing regimen and home exercises.  Discussed which activities to do and which ones to avoid.  Increase activity slowly otherwise.  Follow-up again in 6 to 8 weeks.

## 2022-05-13 NOTE — Patient Instructions (Addendum)
Good to see you! Drained right knee today  Good luck with the other one See you again in 6-8 weeks

## 2022-05-24 DIAGNOSIS — H40013 Open angle with borderline findings, low risk, bilateral: Secondary | ICD-10-CM | POA: Diagnosis not present

## 2022-05-24 DIAGNOSIS — Z961 Presence of intraocular lens: Secondary | ICD-10-CM | POA: Diagnosis not present

## 2022-06-21 ENCOUNTER — Ambulatory Visit: Payer: Self-pay

## 2022-06-21 ENCOUNTER — Encounter: Payer: Self-pay | Admitting: Family Medicine

## 2022-06-21 ENCOUNTER — Ambulatory Visit (INDEPENDENT_AMBULATORY_CARE_PROVIDER_SITE_OTHER): Payer: Medicare Other | Admitting: Family Medicine

## 2022-06-21 ENCOUNTER — Ambulatory Visit (INDEPENDENT_AMBULATORY_CARE_PROVIDER_SITE_OTHER): Payer: Medicare Other

## 2022-06-21 VITALS — BP 132/96 | HR 71 | Ht 66.0 in | Wt 119.0 lb

## 2022-06-21 DIAGNOSIS — M25562 Pain in left knee: Secondary | ICD-10-CM

## 2022-06-21 DIAGNOSIS — M25561 Pain in right knee: Secondary | ICD-10-CM

## 2022-06-21 DIAGNOSIS — M2550 Pain in unspecified joint: Secondary | ICD-10-CM | POA: Diagnosis not present

## 2022-06-21 DIAGNOSIS — M7120 Synovial cyst of popliteal space [Baker], unspecified knee: Secondary | ICD-10-CM | POA: Diagnosis not present

## 2022-06-21 NOTE — Assessment & Plan Note (Signed)
Left-sided aspirated again today as well as the right side.  Patient did have more of a ganglion cyst along noted on discussed with patient about icing regimen and home exercises.  Severe arthritic changes of the knees.  Discussed bracing and given a another brace that I think will be more beneficial with a Tru pull lite secondary to the significant arthritic changes of the patellofemoral joint.  With me again in 6 to 8 weeks otherwise.

## 2022-06-21 NOTE — Patient Instructions (Addendum)
Xray today Heel lift left side Drained bilateral Baker Cysts

## 2022-06-21 NOTE — Progress Notes (Signed)
Abigail [name] [address] [place] Phone: [phone] Subjective:   Abigail [name], am serving as a scribe for Dr. Hulan Saas.  I'm seeing this patient by the request  of:  Pcp, No  CC: Bilateral knee pain posteriorly.  [id number]  05/13/2022 Aspiration done today.  Discussed icing regimen and home exercises.  Discussed which activities to do and which ones to avoid.  Increase activity slowly otherwise.  Follow-up again in 6 to 8 weeks.  Updated 06/21/2022 Abigail [name] is a 81 y.o. female coming in with complaint of right knee pain. Steroid didn't seem to help much. Last Friday the pain in the back of the knee has stopped. Sore spot over patella tendon.       Past Medical History:  Diagnosis Date   Arthritis    oa   Baker's cyst    behind left knee   Constipation    Hemorrhoids    Hypertension    MVP (mitral valve prolapse)    Neoplasm    of appendix   Rosacea    Past Surgical History:  Procedure Laterality Date   AUGMENTATION MAMMAPLASTY Bilateral 1990   saline   CARDIAC CATHETERIZATION  17years ago   small amount of blockage   CATRACTS Bilateral    COLONOSCOPY  2006 and oct 2018   ILEOCECETOMY N/A 08/17/2017   Procedure: OPEN ILEOCECETOMY;  Surgeon: Armandina Gemma, MD;  Location: [facility];  Service: General;  Laterality: N/A;  ERAS PATHWAY   Social History   Socioeconomic History   Marital status: Married    Spouse name: Not on file   Number of children: Not on file   Years of education: Not on file   Highest education level: Not on file  Occupational History   Not on file  Tobacco Use   Smoking status: Former    Packs/day: 0.50    Years: 10.00    Total pack years: 5.00    Types: Cigarettes   Smokeless tobacco: Never   Tobacco comments:    quit 35 yrs ago  Vaping Use   Vaping Use: Never used  Substance and Sexual Activity   Alcohol use: Yes    Alcohol/week: 7.0 standard drinks of alcohol    Types:  7 Glasses of wine per week    Comment: 1 glass red wine  per night   Drug use: No   Sexual activity: Not on file  Other Topics Concern   Not on file  Social History Narrative   Not on file   Social Determinants of Health   Financial Resource Strain: Not on file  Food Insecurity: Not on file  Transportation Needs: Not on file  Physical Activity: Not on file  Stress: Not on file  Social Connections: Not on file   Allergies  Allergen Reactions   Statins Diarrhea and Nausea And Vomiting   Family History  Problem Relation Age of Onset   Colon cancer Cousin        32's maybe     Current Outpatient Medications (Cardiovascular):    ezetimibe (ZETIA) 10 MG tablet, Take 1 tablet (10 mg total) by mouth daily.   losartan (COZAAR) 100 MG tablet, Take 100 mg by mouth daily.   losartan (COZAAR) 25 MG tablet, Take 25 mg by mouth at bedtime.    Current Outpatient Medications (Analgesics):    Acetaminophen-Aspirin Buffered 250-250 MG tablet, Take 0.05 tablets by mouth 2 (two) times daily.   Current  Outpatient Medications (Other):    COVID-19 mRNA bivalent vaccine, Pfizer, (PFIZER COVID-19 VAC BIVALENT) injection, Inject into the muscle.   COVID-19 mRNA Vac-TriS, Pfizer, (PFIZER-BIONT COVID-19 VAC-TRIS) SUSP injection, Inject into the muscle.   famotidine-calcium carbonate-magnesium hydroxide (PEPCID COMPLETE) 10-800-165 MG chewable tablet, Chew 1 tablet by mouth daily as needed.   gabapentin (NEURONTIN) 100 MG capsule, Take 2 capsules (200 mg total) by mouth at bedtime.   influenza vaccine adjuvanted (FLUAD QUADRIVALENT) 0.5 ML injection, Inject into the muscle.   LORazepam (ATIVAN) 0.5 MG tablet, Take 0.5 mg by mouth 2 (two) times daily as needed for anxiety.   Vitamin D, Ergocalciferol, (DRISDOL) 1.25 MG (50000 UNIT) CAPS capsule, Take 50,000 Units by mouth every 7 (seven) days.   Reviewed prior external information including notes and imaging from  primary care provider As well as  notes that were available from care everywhere and other healthcare systems.  Past medical history, social, surgical and family history all reviewed in electronic medical record.  No pertanent information unless stated regarding to the chief complaint.   Review of Systems:  No headache, visual changes, nausea, vomiting, diarrhea, constipation, dizziness, abdominal pain, skin rash, fevers, chills, night sweats, weight loss, swollen lymph nodes, body aches, joint swelling, chest pain, shortness of breath, mood changes. POSITIVE muscle aches  Objective  Blood pressure (!) 132/96, pulse 71, height '5\' 6"'$  (1.676 m), weight 119 lb (54 kg), SpO2 91 %.   General: No apparent distress alert and oriented x3 mood and affect normal, dressed appropriately.  HEENT: Pupils equal, extraocular movements intact  Respiratory: Patient's speak in full sentences and does not appear short of breath  Cardiovascular: No lower extremity edema, non tender, no erythema  Severe antalgic gait noted.  Patient does have an effusion of the knees bilaterally.  Patient does have crepitus noted bilaterally.  Instability noted with the knees bilaterally.  Procedure: Real-time Ultrasound Guided aspiration and injection of right knee Baker's cyst Device: GE Logiq Q7 Ultrasound guided injection is preferred based studies that show increased duration, increased effect, greater accuracy, decreased procedural pain, increased response rate, and decreased cost with ultrasound guided versus blind injection.  Verbal informed consent obtained.  Time-out conducted.  Noted no overlying erythema, induration, or other signs of local infection.  Skin prepped in a sterile fashion.  Local anesthesia: Topical Ethyl chloride.  With sterile technique and under real time ultrasound guidance: With a 22-gauge 2 inch needle patient was injected with 4 cc of 0.5% Marcaine and aspirated 50 cc of gel-like fluid 1 cc of Kenalog 40 mg/dL. This was from a  posterior approach.  Completed without difficulty  Pain immediately resolved suggesting accurate placement of the medication.  Advised to call if fevers/chills, erythema, induration, drainage, or persistent bleeding.  Impression: Technically successful ultrasound guided injection.  Procedure: Real-time Ultrasound Guided aspiration and injection of left knee Baker's cyst Device: GE Logiq Q7 Ultrasound guided injection is preferred based studies that show increased duration, increased effect, greater accuracy, decreased procedural pain, increased response rate, and decreased cost with ultrasound guided versus blind injection.  Verbal informed consent obtained.  Time-out conducted.  Noted no overlying erythema, induration, or other signs of local infection.  Skin prepped in a sterile fashion.  Local anesthesia: Topical Ethyl chloride.  With sterile technique and under real time ultrasound guidance: With a 22-gauge 2 inch needle patient was injected with 4 cc of 0.5% Marcaine and aspirated 70 cc of straw-colored fluid then injected 1 cc of Kenalog 40 mg/dL.  This was from a posterior approach.  Completed without difficulty  Pain immediately resolved suggesting accurate placement of the medication.  Advised to call if fevers/chills, erythema, induration, drainage, or persistent bleeding.  Impression: Technically successful ultrasound guided injection.    Impression and Recommendations:    The above documentation has been reviewed and is accurate and complete [name], DO

## 2022-06-22 LAB — SYNOVIAL FLUID ANALYSIS, COMPLETE
Basophils, %: 0 %
Eosinophils-Synovial: 0 % (ref 0–2)
Lymphocytes-Synovial Fld: 19 % (ref 0–74)
Monocyte/Macrophage: 6 % (ref 0–69)
Neutrophil, Synovial: 75 % — ABNORMAL HIGH (ref 0–24)
Synoviocytes, %: 0 % (ref 0–15)
WBC, Synovial: 598 cells/uL — ABNORMAL HIGH (ref <150)

## 2022-06-22 LAB — TIQ-NTM

## 2022-06-23 ENCOUNTER — Ambulatory Visit: Payer: Medicare Other | Admitting: Family Medicine

## 2022-06-28 ENCOUNTER — Other Ambulatory Visit (HOSPITAL_BASED_OUTPATIENT_CLINIC_OR_DEPARTMENT_OTHER): Payer: Self-pay

## 2022-06-28 MED ORDER — AREXVY 120 MCG/0.5ML IM SUSR
INTRAMUSCULAR | 0 refills | Status: AC
Start: 2022-06-28 — End: ?
  Filled 2022-06-28: qty 0.5, 1d supply, fill #0

## 2022-06-29 ENCOUNTER — Other Ambulatory Visit (HOSPITAL_BASED_OUTPATIENT_CLINIC_OR_DEPARTMENT_OTHER): Payer: Self-pay

## 2022-07-13 DIAGNOSIS — I10 Essential (primary) hypertension: Secondary | ICD-10-CM | POA: Diagnosis not present

## 2022-07-13 DIAGNOSIS — E785 Hyperlipidemia, unspecified: Secondary | ICD-10-CM | POA: Diagnosis not present

## 2022-07-13 DIAGNOSIS — E559 Vitamin D deficiency, unspecified: Secondary | ICD-10-CM | POA: Diagnosis not present

## 2022-07-13 DIAGNOSIS — E039 Hypothyroidism, unspecified: Secondary | ICD-10-CM | POA: Diagnosis not present

## 2022-07-13 DIAGNOSIS — Z0000 Encounter for general adult medical examination without abnormal findings: Secondary | ICD-10-CM | POA: Diagnosis not present

## 2022-07-20 ENCOUNTER — Other Ambulatory Visit (HOSPITAL_BASED_OUTPATIENT_CLINIC_OR_DEPARTMENT_OTHER): Payer: Self-pay

## 2022-07-20 DIAGNOSIS — E038 Other specified hypothyroidism: Secondary | ICD-10-CM | POA: Diagnosis not present

## 2022-07-20 DIAGNOSIS — E559 Vitamin D deficiency, unspecified: Secondary | ICD-10-CM | POA: Diagnosis not present

## 2022-07-20 DIAGNOSIS — I2510 Atherosclerotic heart disease of native coronary artery without angina pectoris: Secondary | ICD-10-CM | POA: Diagnosis not present

## 2022-07-20 DIAGNOSIS — Z0000 Encounter for general adult medical examination without abnormal findings: Secondary | ICD-10-CM | POA: Diagnosis not present

## 2022-07-20 DIAGNOSIS — M810 Age-related osteoporosis without current pathological fracture: Secondary | ICD-10-CM | POA: Diagnosis not present

## 2022-07-20 DIAGNOSIS — I10 Essential (primary) hypertension: Secondary | ICD-10-CM | POA: Diagnosis not present

## 2022-07-20 MED ORDER — INFLUENZA VAC A&B SA ADJ QUAD 0.5 ML IM PRSY
PREFILLED_SYRINGE | INTRAMUSCULAR | 0 refills | Status: AC
  Filled 2022-07-20: qty 0.5, 1d supply, fill #0

## 2022-08-04 NOTE — Progress Notes (Deleted)
[place] Phone: [phone] Subjective:    I'm seeing this patient by the request  of:  Pcp, No  CC:   [id number]  06/21/2022 Left-sided aspirated again today as well as the right side.  Patient did have more of a ganglion cyst along noted on discussed with patient about icing regimen and home exercises.  Severe arthritic changes of the knees.  Discussed bracing and given a another brace that I think will be more beneficial with a Tru pull lite secondary to the significant arthritic changes of the patellofemoral joint.  With me again in 6 to 8 weeks otherwise.  Update 08/09/2022 Abigail [name] is a 81 y.o. female coming in with complaint of L knee, Baker's cyst. Patient states      Past Medical History:  Diagnosis Date   Arthritis    oa   Baker's cyst    behind left knee   Constipation    Hemorrhoids    Hypertension    MVP (mitral valve prolapse)    Neoplasm    of appendix   Rosacea    Past Surgical History:  Procedure Laterality Date   AUGMENTATION MAMMAPLASTY Bilateral 1990   saline   CARDIAC CATHETERIZATION  17years ago   small amount of blockage   CATRACTS Bilateral    COLONOSCOPY  2006 and oct 2018   ILEOCECETOMY N/A 08/17/2017   Procedure: OPEN ILEOCECETOMY;  Surgeon: Armandina Gemma, MD;  Location: [facility];  Service: General;  Laterality: N/A;  [facility] PATHWAY   Social History   Socioeconomic History   Marital status: Married    Spouse name: Not on file   Number of children: Not on file   Years of education: Not on file   Highest education level: Not on file  Occupational History   Not on file  Tobacco Use   Smoking status: Former    Packs/day: 0.50    Years: 10.00    Total pack years: 5.00    Types: Cigarettes   Smokeless tobacco: Never   Tobacco comments:    quit 35 yrs ago  Vaping Use   Vaping Use: Never used  Substance and Sexual Activity   Alcohol use: Yes    Alcohol/week:  7.0 standard drinks of alcohol    Types: 7 Glasses of wine per week    Comment: 1 glass red wine  per night   Drug use: No   Sexual activity: Not on file  Other Topics Concern   Not on file  Social History Narrative   Not on file   Social Determinants of Health   Financial Resource Strain: Not on file  Food Insecurity: Not on file  Transportation Needs: Not on file  Physical Activity: Not on file  Stress: Not on file  Social Connections: Not on file   Allergies  Allergen Reactions   Statins Diarrhea and Nausea And Vomiting   Family History  Problem Relation Age of Onset   Colon cancer Cousin        41's maybe     Current Outpatient Medications (Cardiovascular):    ezetimibe (ZETIA) 10 MG tablet, Take 1 tablet (10 mg total) by mouth daily.   losartan (COZAAR) 100 MG tablet, Take 100 mg by mouth daily.   losartan (COZAAR) 25 MG tablet, Take 25 mg by mouth at bedtime.    Current Outpatient Medications (Analgesics):    Acetaminophen-Aspirin Buffered 250-250 MG tablet, Take 0.05 tablets by  mouth 2 (two) times daily.   Current Outpatient Medications (Other):    COVID-19 mRNA bivalent vaccine, Pfizer, (PFIZER COVID-19 VAC BIVALENT) injection, Inject into the muscle.   COVID-19 mRNA Vac-TriS, Pfizer, (PFIZER-BIONT COVID-19 VAC-TRIS) SUSP injection, Inject into the muscle.   famotidine-calcium carbonate-magnesium hydroxide (PEPCID COMPLETE) 10-800-165 MG chewable tablet, Chew 1 tablet by mouth daily as needed.   gabapentin (NEURONTIN) 100 MG capsule, Take 2 capsules (200 mg total) by mouth at bedtime.   influenza vaccine adjuvanted (FLUAD QUADRIVALENT) 0.5 ML injection, Inject into the muscle.   influenza vaccine adjuvanted (FLUAD) 0.5 ML injection, Inject into the muscle.   LORazepam (ATIVAN) 0.5 MG tablet, Take 0.5 mg by mouth 2 (two) times daily as needed for anxiety.   RSV vaccine recomb adjuvanted (AREXVY) 120 MCG/0.5ML injection, Inject into the muscle.   Vitamin D,  Ergocalciferol, (DRISDOL) 1.25 MG (50000 UNIT) CAPS capsule, Take 50,000 Units by mouth every 7 (seven) days.   Reviewed prior external information including notes and imaging from  primary care provider As well as notes that were available from care everywhere and other healthcare systems.  Past medical history, social, surgical and family history all reviewed in electronic medical record.  No pertanent information unless stated regarding to the chief complaint.   Review of Systems:  No headache, visual changes, nausea, vomiting, diarrhea, constipation, dizziness, abdominal pain, skin rash, fevers, chills, night sweats, weight loss, swollen lymph nodes, body aches, joint swelling, chest pain, shortness of breath, mood changes. POSITIVE muscle aches  Objective  There were no vitals taken for this visit.   General: No apparent distress alert and oriented x3 mood and affect normal, dressed appropriately.  HEENT: Pupils equal, extraocular movements intact  Respiratory: Patient's speak in full sentences and does not appear short of breath  Cardiovascular: No lower extremity edema, non tender, no erythema      Impression and Recommendations:

## 2022-08-09 ENCOUNTER — Ambulatory Visit: Payer: Medicare Other | Admitting: Family Medicine

## 2022-08-09 ENCOUNTER — Other Ambulatory Visit (HOSPITAL_BASED_OUTPATIENT_CLINIC_OR_DEPARTMENT_OTHER): Payer: Self-pay

## 2022-08-09 MED ORDER — COMIRNATY 30 MCG/0.3ML IM SUSY
PREFILLED_SYRINGE | INTRAMUSCULAR | 0 refills | Status: AC
  Filled 2022-08-09: qty 0.3, 1d supply, fill #0

## 2022-08-16 NOTE — Progress Notes (Unsigned)
Abigail [name] Phone: [phone] Subjective:   Abigail [name], am serving as a scribe for Dr. Hulan Saas.  I'm seeing this patient by the request  of:  Pcp, [name]  CC: Left knee pain  [id number]  06/21/2022 Left-sided aspirated again today as well as the right side.  Patient did have more of a ganglion cyst along noted on discussed with patient about icing regimen and home exercises.  Severe arthritic changes of the knees.  Discussed bracing and given a another brace that I think will be more beneficial with a Tru pull lite secondary to the significant arthritic changes of the patellofemoral joint.  With me again in 6 to 8 weeks otherwise.  Update 08/17/2022 Abigail [name] is a 81 y.o. female coming in with complaint of B knee pain. Patient states that aspiration last visit was helpful. Unsure if baker's cyst has filled back up.   Also c/o R sided neck pain. Notices crunching in the neck. Feels like she developed issue from position of tv in bedroom which requires her to turn her head.   Xray 06/21/2022 R knee IMPRESSION: Degenerative changes without acute abnormality.     Past Medical History:  Diagnosis Date   Arthritis    oa   Baker's cyst    behind left knee   Constipation    Hemorrhoids    Hypertension    MVP (mitral valve prolapse)    Neoplasm    of appendix   Rosacea    Past Surgical History:  Procedure Laterality Date   AUGMENTATION MAMMAPLASTY Bilateral 1990   saline   CARDIAC CATHETERIZATION  17years ago   small amount of blockage   CATRACTS Bilateral    COLONOSCOPY  2006 and oct 2018   ILEOCECETOMY N/A 08/17/2017   Procedure: OPEN ILEOCECETOMY;  Surgeon: Armandina Gemma, MD;  Location: [facility];  Service: General;  Laterality: N/A;  [facility] PATHWAY   Social History   Socioeconomic History   Marital status: Married    Spouse name: Not on file   Number of children: Not on file   Years of  education: Not on file   Highest education level: Not on file  Occupational History   Not on file  Tobacco Use   Smoking status: Former    Packs/day: 0.50    Years: 10.00    Total pack years: 5.00    Types: Cigarettes   Smokeless tobacco: Never   Tobacco comments:    quit 35 yrs ago  Vaping Use   Vaping Use: Never used  Substance and Sexual Activity   Alcohol use: Yes    Alcohol/week: 7.0 standard drinks of alcohol    Types: 7 Glasses of wine per week    Comment: 1 glass red wine  per night   Drug use: [name]   Sexual activity: Not on file  Other Topics Concern   Not on file  Social History Narrative   Not on file   Social Determinants of Health   Financial Resource Strain: Not on file  Food Insecurity: Not on file  Transportation Needs: Not on file  Physical Activity: Not on file  Stress: Not on file  Social Connections: Not on file   Allergies  Allergen Reactions   Statins Diarrhea and Nausea And Vomiting   Family History  Problem Relation Age of Onset   Colon cancer Cousin        87's maybe  Current Outpatient Medications (Cardiovascular):    losartan (COZAAR) 100 MG tablet, Take 100 mg by mouth daily.   ezetimibe (ZETIA) 10 MG tablet, Take 1 tablet (10 mg total) by mouth daily.   losartan (COZAAR) 25 MG tablet, Take 25 mg by mouth at bedtime.    Current Outpatient Medications (Analgesics):    Acetaminophen-Aspirin Buffered 250-250 MG tablet, Take 0.05 tablets by mouth 2 (two) times daily.   Current Outpatient Medications (Other):    COVID-19 mRNA bivalent vaccine, Pfizer, (PFIZER COVID-19 VAC BIVALENT) injection, Inject into the muscle.   COVID-19 mRNA Vac-TriS, Pfizer, (PFIZER-BIONT COVID-19 VAC-TRIS) SUSP injection, Inject into the muscle.   COVID-19 mRNA vaccine 2023-2024 (COMIRNATY) syringe, Inject into the muscle.   famotidine-calcium carbonate-magnesium hydroxide (PEPCID COMPLETE) 10-800-165 MG chewable tablet, Chew 1 tablet by mouth daily as  needed.   gabapentin (NEURONTIN) 100 MG capsule, Take 2 capsules (200 mg total) by mouth at bedtime.   influenza vaccine adjuvanted (FLUAD QUADRIVALENT) 0.5 ML injection, Inject into the muscle.   influenza vaccine adjuvanted (FLUAD) 0.5 ML injection, Inject into the muscle.   LORazepam (ATIVAN) 0.5 MG tablet, Take 0.5 mg by mouth 2 (two) times daily as needed for anxiety.   RSV vaccine recomb adjuvanted (AREXVY) 120 MCG/0.5ML injection, Inject into the muscle.   Vitamin D, Ergocalciferol, (DRISDOL) 1.25 MG (50000 UNIT) CAPS capsule, Take 50,000 Units by mouth every 7 (seven) days.   Reviewed prior external information including notes and imaging from  primary care provider As well as notes that were available from care everywhere and other healthcare systems.  Past medical history, social, surgical and family history all reviewed in electronic medical record.  [name] pertanent information unless stated regarding to the chief complaint.   Review of Systems:  [name] headache, visual changes, nausea, vomiting, diarrhea, constipation, dizziness, abdominal pain, skin rash, fevers, chills, night sweats, weight loss, swollen lymph nodes, body aches, joint swelling, chest pain, shortness of breath, mood changes. POSITIVE muscle aches  Objective  Blood pressure 128/82, pulse 65, height '5\' 6"'$  (1.676 m), weight 116 lb (52.6 kg), SpO2 99 %.   General: [name] apparent distress alert and oriented x3 mood and affect normal, dressed appropriately.  HEENT: Pupils equal, extraocular movements intact  Respiratory: Patient's speak in full sentences and does not appear short of breath  Cardiovascular: [name] lower extremity edema, non tender, [name] erythema  Severe antalgic gait noted bilaterally with instability of the knees.  Significant swelling noted   Procedure: Real-time Ultrasound Guided Injection of left knee Device: GE Logiq Q7 Ultrasound guided injection is preferred based studies that show increased duration,  increased effect, greater accuracy, decreased procedural pain, increased response rate, and decreased cost with ultrasound guided versus blind injection.  Verbal informed consent obtained.  Time-out conducted.  Noted [name] overlying erythema, induration, or other signs of local infection.  Skin prepped in a sterile fashion.  Local anesthesia: Topical Ethyl chloride.  With sterile technique and under real time ultrasound guidance: With a 22-gauge 2 inch needle patient was injected with  cc of 0.5% Marcaine and aspirated 55 cc of straw light-colored fluid then injected with 1 cc of Kenalog 40 mg/dL. This was from a posterior approach.  Completed without difficulty  Pain immediately resolved suggesting accurate placement of the medication.  Advised to call if fevers/chills, erythema, induration, drainage, or persistent bleeding.  Impression: Technically successful ultrasound guided injection.   Impression and Recommendations:     The above documentation has been reviewed and is accurate and complete  [name], DO

## 2022-08-17 ENCOUNTER — Ambulatory Visit: Payer: Self-pay

## 2022-08-17 ENCOUNTER — Ambulatory Visit (INDEPENDENT_AMBULATORY_CARE_PROVIDER_SITE_OTHER): Payer: Medicare Other | Admitting: Family Medicine

## 2022-08-17 ENCOUNTER — Encounter: Payer: Self-pay | Admitting: Family Medicine

## 2022-08-17 VITALS — BP 128/82 | HR 65 | Ht 66.0 in | Wt 116.0 lb

## 2022-08-17 DIAGNOSIS — G8929 Other chronic pain: Secondary | ICD-10-CM

## 2022-08-17 DIAGNOSIS — M1712 Unilateral primary osteoarthritis, left knee: Secondary | ICD-10-CM

## 2022-08-17 DIAGNOSIS — M25562 Pain in left knee: Secondary | ICD-10-CM | POA: Diagnosis not present

## 2022-08-17 DIAGNOSIS — M25561 Pain in right knee: Secondary | ICD-10-CM

## 2022-08-17 NOTE — Assessment & Plan Note (Addendum)
Chronic problem with aspiration.  He does have severe arthritic changes noted.  Patient did have significant swelling in.  Greater than 55 cc of straw-colored fluid.  Patient still holding off on any type of surgical intervention.  Discussed icing regimen and home exercises.  Increase activity slowly otherwise.  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

## 2022-08-17 NOTE — Patient Instructions (Addendum)
Drained L knee today Flonase 2 puffs in each nostril for next 2 weeks When you take mirilax also take colace See me again in 2-3 months

## 2022-08-24 DIAGNOSIS — Z85828 Personal history of other malignant neoplasm of skin: Secondary | ICD-10-CM | POA: Diagnosis not present

## 2022-08-24 DIAGNOSIS — X32XXXD Exposure to sunlight, subsequent encounter: Secondary | ICD-10-CM | POA: Diagnosis not present

## 2022-08-24 DIAGNOSIS — Z08 Encounter for follow-up examination after completed treatment for malignant neoplasm: Secondary | ICD-10-CM | POA: Diagnosis not present

## 2022-08-24 DIAGNOSIS — D225 Melanocytic nevi of trunk: Secondary | ICD-10-CM | POA: Diagnosis not present

## 2022-08-24 DIAGNOSIS — L570 Actinic keratosis: Secondary | ICD-10-CM | POA: Diagnosis not present

## 2022-10-10 NOTE — Progress Notes (Signed)
[name] [place] Phone: [phone] Subjective:   Abigail [name], am serving as a scribe for Dr. Hulan Saas.  I'm seeing this patient by the request  of:  Pcp, No  CC: Bilateral knee pain  [id number]  08/17/2022 Chronic problem with aspiration.  He does have severe arthritic changes noted.  Patient did have significant swelling in.  Greater than 55 cc of straw-colored fluid.  Patient still holding off on any type of surgical intervention.  Discussed icing regimen and home exercises.  Increase activity slowly otherwise.  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 female coming in with complaint of B knee. L>R. Patient states that she was not sure she was going to come or not today, but the right knee just started to bother her slightly. Left knee has a bakers cyst and is not sure if she should get it drained. Patients husband is getting a knee replacement coming up so she wants to be feeling well to take care of him.       Past Medical History:  Diagnosis Date   Arthritis    oa   Baker's cyst    behind left knee   Constipation    Hemorrhoids    Hypertension    MVP (mitral valve prolapse)    Neoplasm    of appendix   Rosacea    Past Surgical History:  Procedure Laterality Date   AUGMENTATION MAMMAPLASTY Bilateral 1990   saline   CARDIAC CATHETERIZATION  17years ago   small amount of blockage   CATRACTS Bilateral    COLONOSCOPY  2006 and oct 2018   ILEOCECETOMY N/A 08/17/2017   Procedure: OPEN ILEOCECETOMY;  Surgeon: Armandina Gemma, MD;  Location: [facility];  Service: General;  Laterality: N/A;  [facility]AS PATHWAY   Social History   Socioeconomic History   Marital status:  Married    Spouse name: Not on file   Number of children: Not on file   Years of education: Not on file   Highest education level: Not on file  Occupational History   Not on file  Tobacco Use   Smoking status: Former    Packs/day: 0.50    Years: 10.00    Total pack years: 5.00    Types: Cigarettes   Smokeless tobacco: Never   Tobacco comments:    quit 35 yrs ago  Vaping Use   Vaping Use: Never used  Substance and Sexual Activity   Alcohol use: Yes    Alcohol/week: 7.0 standard drinks of alcohol    Types: 7 Glasses of wine per week    Comment: 1 glass red wine  per night   Drug use: No   Sexual activity: Not on file  Other Topics Concern   Not on file  Social History Narrative   Not on file   Social Determinants of Health   Financial Resource Strain: Not on file  Food Insecurity: Not on file  Transportation Needs: Not on file  Physical Activity: Not on file  Stress: Not on file  Social Connections: Not on file   Allergies  Allergen Reactions   Statins Diarrhea and Nausea And Vomiting   Family History  Problem Relation  Age of Onset   Colon cancer Cousin        43's maybe     Current Outpatient Medications (Cardiovascular):    losartan (COZAAR) 100 MG tablet, Take 100 mg by mouth daily.   Current Outpatient Medications (Analgesics):    Acetaminophen-Aspirin Buffered 250-250 MG tablet, Take 0.05 tablets by mouth 2 (two) times daily.   Current Outpatient Medications (Other):    COVID-19 mRNA bivalent vaccine, Pfizer, (PFIZER COVID-19 VAC BIVALENT) injection, Inject into the muscle.   COVID-19 mRNA Vac-TriS, Pfizer, (PFIZER-BIONT COVID-19 VAC-TRIS) SUSP injection, Inject into the muscle.   COVID-19 mRNA vaccine 2023-2024 (COMIRNATY) syringe, Inject into the muscle.   famotidine-calcium carbonate-magnesium hydroxide (PEPCID COMPLETE) 10-800-165 MG chewable tablet, Chew 1 tablet by mouth daily as needed.   gabapentin (NEURONTIN) 100 MG capsule, Take 2 capsules  (200 mg total) by mouth at bedtime.   influenza vaccine adjuvanted (FLUAD QUADRIVALENT) 0.5 ML injection, Inject into the muscle.   influenza vaccine adjuvanted (FLUAD) 0.5 ML injection, Inject into the muscle.   LORazepam (ATIVAN) 0.5 MG tablet, Take 0.5 mg by mouth 2 (two) times daily as needed for anxiety.   RSV vaccine recomb adjuvanted (AREXVY) 120 MCG/0.5ML injection, Inject into the muscle.   Reviewed prior external information including notes and imaging from  primary care provider As well as notes that were available from care everywhere and other healthcare systems.  Past medical history, social, surgical and family history all reviewed in electronic medical record.  No pertanent information unless stated regarding to the chief complaint.   Review of Systems:  No headache, visual changes, nausea, vomiting, diarrhea, constipation, dizziness, abdominal pain, skin rash, fevers, chills, night sweats, weight loss, swollen lymph nodes, body aches, joint swelling, chest pain, shortness of breath, mood changes. POSITIVE muscle aches  Objective  Blood pressure (!) 150/90, pulse 78, height '5\' 6"'$  (1.676 m), weight 115 lb (52.2 kg), SpO2 93 %.   General: No apparent distress alert and oriented x3 mood and affect normal, dressed appropriately.  HEENT: Pupils equal, extraocular movements intact  Respiratory: Patient's speak in full sentences and does not appear short of breath  Cardiovascular: No lower extremity edema, non tender, no erythema  Antalgic gait noted.  Patient does have arthritic changes of the knees bilaterally.  Patient does have fullness of the popliteal area bilaterally but left greater than right.  Does have some limited flexion of the knee noted.  Procedure: Real-time Ultrasound Guided Injection of left Baker's cyst Device: GE Logiq Q7 Ultrasound guided injection is preferred based studies that show increased duration, increased effect, greater accuracy, decreased procedural  pain, increased response rate, and decreased cost with ultrasound guided versus blind injection.  Verbal informed consent obtained.  Time-out conducted.  Noted no overlying erythema, induration, or other signs of local infection.  Skin prepped in a sterile fashion.  Local anesthesia: Topical Ethyl chloride.  With sterile technique and under real time ultrasound guidance: With a 22-gauge 2 inch needle patient was injected with 4 cc of 0.5% Marcaine and aspirated 80 cc of straw light-colored fluid with gel-like material 1 cc of Kenalog 40 mg/dL. This was from a posterior approach.  Completed without difficulty  Pain immediately resolved suggesting accurate placement of the medication.  Advised to call if fevers/chills, erythema, induration, drainage, or persistent bleeding.  Impression: Technically successful ultrasound guided injection.    Impression and Recommendations:    The above documentation has been reviewed and is accurate and complete [name], DO

## 2022-10-12 ENCOUNTER — Ambulatory Visit: Payer: Self-pay

## 2022-10-12 ENCOUNTER — Ambulatory Visit (INDEPENDENT_AMBULATORY_CARE_PROVIDER_SITE_OTHER): Payer: Medicare Other | Admitting: Family Medicine

## 2022-10-12 VITALS — BP 150/90 | HR 78 | Ht 66.0 in | Wt 115.0 lb

## 2022-10-12 DIAGNOSIS — M7122 Synovial cyst of popliteal space [Baker], left knee: Secondary | ICD-10-CM | POA: Diagnosis not present

## 2022-10-12 NOTE — Patient Instructions (Addendum)
Good to see you  Left knee drained today Follow up in 5-6 weeks for injection in right knee

## 2022-10-12 NOTE — Assessment & Plan Note (Signed)
Aspiration noted as well.  Discussed icing regimen and home exercises.  Which activities to do and which ones to avoid.  Does have the underlying arthritic changes and is holding on any type of surgical intervention with her going to be the primary care provider for her husband with knee replacement in the next 1 to 2 months.  Patient will follow-up again in 6 weeks to see if the right knee needs aspiration or not and to further evaluate the underlying problems of the arthritis.

## 2022-11-15 NOTE — Progress Notes (Signed)
[name] Phone: [phone] Subjective:   [name], am serving as a scribe for Dr. Hulan Saas.  I'm seeing this patient by the request  of:  Pcp, No  CC: left knee pain   SEG:[id number]  10/12/2022 Aspiration noted as well.  Discussed icing regimen and home exercises.  Which activities to do and which ones to avoid.  Does have the underlying arthritic changes and is holding on any type of surgical intervention with her going to be the primary care provider for her husband with knee replacement in the next 1 to 2 months.  Patient will follow-up again in 6 weeks to see if the right knee needs aspiration or not and to further evaluate the underlying problems of the arthritis.     Update 11/16/2022 [name] is a 82 y.o. female coming in with complaint of L knee pain. Patient states that her knee stated to swell 2 weeks ago. Husband having knee surgery in February and needs to be able to take care of him.        Past Medical History:  Diagnosis Date   Arthritis    oa   Baker's cyst    behind left knee   Constipation    Hemorrhoids    Hypertension    MVP (mitral valve prolapse)    Neoplasm    of appendix   Rosacea    Past Surgical History:  Procedure Laterality Date   AUGMENTATION MAMMAPLASTY Bilateral 1990   saline   CARDIAC CATHETERIZATION  17years ago   small amount of blockage   CATRACTS Bilateral    COLONOSCOPY  2006 and oct 2018   ILEOCECETOMY N/A 08/17/2017   Procedure: OPEN ILEOCECETOMY;  Surgeon: Armandina Gemma, MD;  Location: [facility];  Service: General;  Laterality: N/A;  [facility] PATHWAY   Social History   Socioeconomic History   Marital status: Married    Spouse name: Not on file   Number of children: Not on file   Years of education: Not on file   Highest education level: Not on file  Occupational History   Not on file  Tobacco Use   Smoking status: Former    Packs/day:  0.50    Years: 10.00    Total pack years: 5.00    Types: Cigarettes   Smokeless tobacco: Never   Tobacco comments:    quit 35 yrs ago  Vaping Use   Vaping Use: Never used  Substance and Sexual Activity   Alcohol use: Yes    Alcohol/week: 7.0 standard drinks of alcohol    Types: 7 Glasses of wine per week    Comment: 1 glass red wine  per night   Drug use: No   Sexual activity: Not on file  Other Topics Concern   Not on file  Social History Narrative   Not on file   Social Determinants of Health   Financial Resource Strain: Not on file  Food Insecurity: Not on file  Transportation Needs: Not on file  Physical Activity: Not on file  Stress: Not on file  Social Connections: Not on file   Allergies  Allergen Reactions   Statins Diarrhea and Nausea And Vomiting   Family History  Problem Relation Age of Onset   Colon cancer Cousin        47's maybe     Current Outpatient Medications (Cardiovascular):    losartan (COZAAR) 100 MG tablet, Take 100  mg by mouth daily.   Current Outpatient Medications (Analgesics):    Acetaminophen-Aspirin Buffered 250-250 MG tablet, Take 0.05 tablets by mouth 2 (two) times daily.   Current Outpatient Medications (Other):    COVID-19 mRNA bivalent vaccine, Pfizer, (PFIZER COVID-19 VAC BIVALENT) injection, Inject into the muscle.   COVID-19 mRNA Vac-TriS, Pfizer, (PFIZER-BIONT COVID-19 VAC-TRIS) SUSP injection, Inject into the muscle.   COVID-19 mRNA vaccine 2023-2024 (COMIRNATY) syringe, Inject into the muscle.   famotidine-calcium carbonate-magnesium hydroxide (PEPCID COMPLETE) 10-800-165 MG chewable tablet, Chew 1 tablet by mouth daily as needed.   influenza vaccine adjuvanted (FLUAD QUADRIVALENT) 0.5 ML injection, Inject into the muscle.   influenza vaccine adjuvanted (FLUAD) 0.5 ML injection, Inject into the muscle.   LORazepam (ATIVAN) 0.5 MG tablet, Take 0.5 mg by mouth 2 (two) times daily as needed for anxiety.   RSV vaccine recomb  adjuvanted (AREXVY) 120 MCG/0.5ML injection, Inject into the muscle.   gabapentin (NEURONTIN) 100 MG capsule, Take 2 capsules (200 mg total) by mouth at bedtime.   Reviewed prior external information including notes and imaging from  primary care provider As well as notes that were available from care everywhere and other healthcare systems.  Past medical history, social, surgical and family history all reviewed in electronic medical record.  No pertanent information unless stated regarding to the chief complaint.   Review of Systems:  No headache, visual changes, nausea, vomiting, diarrhea, constipation, dizziness, abdominal pain, skin rash, fevers, chills, night sweats, weight loss, swollen lymph nodes, body aches, joint swelling, chest pain, shortness of breath, mood changes. POSITIVE muscle aches  Objective  Blood pressure 116/88, height '5\' 6"'$  (1.676 m), weight 112 lb (50.8 kg).   General: No apparent distress alert and oriented x3 mood and affect normal, dressed appropriately.  HEENT: Pupils equal, extraocular movements intact  Respiratory: Patient's speak in full sentences and does not appear short of breath  Cardiovascular: No lower extremity edema, non tender, no erythema  Patient's knees do have significant arthritic changes noted.  Fullness noted in the popliteal area bilaterally.  Patient does have tenderness to palpation noted.  Instability noted with varus and valgus force  Procedure: Real-time Ultrasound Guided Injection of right knee Baker's cyst Device: GE Logiq Q7 Ultrasound guided injection is preferred based studies that show increased duration, increased effect, greater accuracy, decreased procedural pain, increased response rate, and decreased cost with ultrasound guided versus blind injection.  Verbal informed consent obtained.  Time-out conducted.  Noted no overlying erythema, induration, or other signs of local infection.  Skin prepped in a sterile fashion.  Local  anesthesia: Topical Ethyl chloride.  With sterile technique and under real time ultrasound guidance: With a 22-gauge 2 inch needle patient was injected with 4 cc of 0.5% Marcaine and aspirated 30 cc of straw-colored colored fluid and then injected 1 cc of Kenalog 40 mg/dL. This was from a posterior approach.  Completed without difficulty  Pain immediately resolved suggesting accurate placement of the medication.  Advised to call if fevers/chills, erythema, induration, drainage, or persistent bleeding.   Impression: Technically successful ultrasound guided injection.  Procedure: Real-time Ultrasound Guided Injection of left knee Baker's cyst Device: GE Logiq Q7 Ultrasound guided injection is preferred based studies that show increased duration, increased effect, greater accuracy, decreased procedural pain, increased response rate, and decreased cost with ultrasound guided versus blind injection.  Verbal informed consent obtained.  Time-out conducted.  Noted no overlying erythema, induration, or other signs of local infection.  Skin prepped in a sterile  fashion.  Local anesthesia: Topical Ethyl chloride.  With sterile technique and under real time ultrasound guidance: With an 18-gauge 1-1/2 inch needle injected with 1 cc of 0.5% Marcaine and then aspiration of 35 cc of straw-colored fluid from the posterior approach.  Patient then was injected with 1 cc of 40 mg/mL. Completed without difficulty  Pain immediately resolved suggesting accurate placement of the medication.  Advised to call if fevers/chills, erythema, induration, drainage, or persistent bleeding.  Impression: Technically successful ultrasound guided injection.    Impression and Recommendations:     The above documentation has been reviewed and is accurate and complete [name], DO

## 2022-11-16 ENCOUNTER — Encounter: Payer: Self-pay | Admitting: Family Medicine

## 2022-11-16 ENCOUNTER — Ambulatory Visit: Payer: Self-pay

## 2022-11-16 ENCOUNTER — Ambulatory Visit (INDEPENDENT_AMBULATORY_CARE_PROVIDER_SITE_OTHER): Payer: Medicare Other | Admitting: Family Medicine

## 2022-11-16 VITALS — BP 116/88 | Ht 66.0 in | Wt 112.0 lb

## 2022-11-16 DIAGNOSIS — M25562 Pain in left knee: Secondary | ICD-10-CM | POA: Diagnosis not present

## 2022-11-16 DIAGNOSIS — M7120 Synovial cyst of popliteal space [Baker], unspecified knee: Secondary | ICD-10-CM | POA: Diagnosis not present

## 2022-11-16 DIAGNOSIS — M25561 Pain in right knee: Secondary | ICD-10-CM | POA: Diagnosis not present

## 2022-11-16 DIAGNOSIS — G8929 Other chronic pain: Secondary | ICD-10-CM

## 2022-11-16 NOTE — Patient Instructions (Addendum)
Great to see you  Ice 20 minutes 2 times daily. Usually after activity and before bed. Continue with compression  See me again in 3

## 2022-11-16 NOTE — Assessment & Plan Note (Signed)
Bilateral aspirations done today.  We discussed with patient of the underlying arthritic changes. Once okay for any type of replacement especially with has been having his replacement in the near future and social determinant of health will be she will be the primary caregiver.  Discussed with patient about icing regimen and home exercises.  Increase activity slowly.  Follow-up again in 6 to 8 weeks

## 2023-02-08 DIAGNOSIS — Z961 Presence of intraocular lens: Secondary | ICD-10-CM | POA: Diagnosis not present

## 2023-02-08 DIAGNOSIS — H5211 Myopia, right eye: Secondary | ICD-10-CM | POA: Diagnosis not present

## 2023-02-08 DIAGNOSIS — H40013 Open angle with borderline findings, low risk, bilateral: Secondary | ICD-10-CM | POA: Diagnosis not present

## 2023-02-14 NOTE — Progress Notes (Unsigned)
[facility] [address] [place] [address] Phone: [phone] Subjective:   Bruce Donath, am serving as a scribe for Dr. Antoine Primas.  I'm seeing this patient by the request  of:  Pcp, No  CC: bilateral knee pain   [id number]  11/16/2022 Bilateral aspirations done today.  We discussed with patient of the underlying arthritic changes. Once okay for any type of replacement especially with has been having his replacement in the near future and social determinant of health will be she will be the primary caregiver.  Discussed with patient about icing regimen and home exercises.  Increase activity slowly.  Follow-up again in 6 to 8 weeks      Update 02/15/2023 KAMALI SAKATA is a 82 y.o. female coming in with complaint of B knee pain. Patient states that in January she had some relief from the aspiration. Has been back in the gym riding the recumbent bike while wearing knee sleeves. Unable to walk on Saturday after that ride. Pain over medial aspect of the knee. Pain is improving.   Also c/o pain in L SI joint for the past 2 weeks. Lidocaine patches have been helpful.     Past Medical History:  Diagnosis Date   Arthritis    oa   Baker's cyst    behind left knee   Constipation    Hemorrhoids    Hypertension    MVP (mitral valve prolapse)    Neoplasm    of appendix   Rosacea    Past Surgical History:  Procedure Laterality Date   AUGMENTATION MAMMAPLASTY Bilateral 1990   saline   CARDIAC CATHETERIZATION  17years ago   small amount of blockage   CATRACTS Bilateral    COLONOSCOPY  2006 and oct 2018   ILEOCECETOMY N/A 08/17/2017   Procedure: OPEN ILEOCECETOMY;  Surgeon: Darnell Level, MD;  Location: [facility];  Service: General;  Laterality: N/A;  ERAS PATHWAY   Social History   Socioeconomic History   Marital status: Married    Spouse name: Not on file   Number of children: Not on file   Years of education: Not on file   Highest  education level: Not on file  Occupational History   Not on file  Tobacco Use   Smoking status: Former    Packs/day: 0.50    Years: 10.00    Additional pack years: 0.00    Total pack years: 5.00    Types: Cigarettes   Smokeless tobacco: Never   Tobacco comments:    quit 35 yrs ago  Vaping Use   Vaping Use: Never used  Substance and Sexual Activity   Alcohol use: Yes    Alcohol/week: 7.0 standard drinks of alcohol    Types: 7 Glasses of wine per week    Comment: 1 glass red wine  per night   Drug use: No   Sexual activity: Not on file  Other Topics Concern   Not on file  Social History Narrative   Not on file   Social Determinants of Health   Financial Resource Strain: Not on file  Food Insecurity: Not on file  Transportation Needs: Not on file  Physical Activity: Not on file  Stress: Not on file  Social Connections: Not on file   Allergies  Allergen Reactions   Statins Diarrhea and Nausea And Vomiting   Family History  Problem Relation Age of Onset   Colon cancer Cousin  60's maybe     Current Outpatient Medications (Cardiovascular):    losartan (COZAAR) 100 MG tablet, Take 100 mg by mouth daily.   Current Outpatient Medications (Analgesics):    Acetaminophen-Aspirin Buffered 250-250 MG tablet, Take 0.05 tablets by mouth 2 (two) times daily.   Current Outpatient Medications (Other):    COVID-19 mRNA bivalent vaccine, Pfizer, (PFIZER COVID-19 VAC BIVALENT) injection, Inject into the muscle.   COVID-19 mRNA Vac-TriS, Pfizer, (PFIZER-BIONT COVID-19 VAC-TRIS) SUSP injection, Inject into the muscle.   COVID-19 mRNA vaccine 2023-2024 (COMIRNATY) syringe, Inject into the muscle.   famotidine-calcium carbonate-magnesium hydroxide (PEPCID COMPLETE) 10-800-165 MG chewable tablet, Chew 1 tablet by mouth daily as needed.   influenza vaccine adjuvanted (FLUAD QUADRIVALENT) 0.5 ML injection, Inject into the muscle.   influenza vaccine adjuvanted (FLUAD) 0.5 ML  injection, Inject into the muscle.   LORazepam (ATIVAN) 0.5 MG tablet, Take 0.5 mg by mouth 2 (two) times daily as needed for anxiety.   RSV vaccine recomb adjuvanted (AREXVY) 120 MCG/0.5ML injection, Inject into the muscle.   gabapentin (NEURONTIN) 100 MG capsule, Take 2 capsules (200 mg total) by mouth at bedtime.   Reviewed prior external information including notes and imaging from  primary care provider As well as notes that were available from care everywhere and other healthcare systems.  Past medical history, social, surgical and family history all reviewed in electronic medical record.  No pertanent information unless stated regarding to the chief complaint.   Review of Systems:  No headache, visual changes, nausea, vomiting, diarrhea, constipation, dizziness, abdominal pain, skin rash, fevers, chills, night sweats, weight loss, swollen lymph nodes, body aches, joint swelling, chest pain, shortness of breath, mood changes. POSITIVE muscle aches  Objective  Blood pressure 132/88, pulse 72, height 5\' 6"  (1.676 m), weight 112 lb (50.8 kg), SpO2 98 %.   General: No apparent distress alert and oriented x3 mood and affect normal, dressed appropriately.  HEENT: Pupils equal, extraocular movements intact  Respiratory: Patient's speak in full sentences and does not appear short of breath  Cardiovascular: No lower extremity edema, non tender, no erythema  antalgic  Gait noted. Patient's knees bilaterally do have swelling more in the popliteal area. Severe scoliosis of the back noted.  Tender to palpation over the left sacroiliac joint  Procedure: Real-time Ultrasound Guided Injection of right knee aspiration and injection Device: GE Logiq Q7 Ultrasound guided injection is preferred based studies that show increased duration, increased effect, greater accuracy, decreased procedural pain, increased response rate, and decreased cost with ultrasound guided versus blind injection.  Verbal  informed consent obtained.  Time-out conducted.  Noted no overlying erythema, induration, or other signs of local infection.  Skin prepped in a sterile fashion.  Local anesthesia: Topical Ethyl chloride.  With sterile technique and under real time ultrasound guidance: With a 22-gauge 2 inch needle patient was injected with 4 cc of 0.5% Marcaine and then aspirated 29 cc of straw-colored fluid and injected 1 cc of Kenalog 40 mg/dL. This was from a \\posterior  l approach.  Completed without difficulty  Pain immediately resolved suggesting accurate placement of the medication.  Advised to call if fevers/chills, erythema, induration, drainage, or persistent bleeding.  Images permanently stored and available for review in the ultrasound unit.  Impression: Technically successful ultrasound guided injection.   Procedure: Real-time Ultrasound Guided Injection of left knee Device: GE Logiq Q7 Ultrasound guided injection is preferred based studies that show increased duration, increased effect, greater accuracy, decreased procedural pain, increased response rate, and  decreased cost with ultrasound guided versus blind injection.  Verbal informed consent obtained.  Time-out conducted.  Noted no overlying erythema, induration, or other signs of local infection.  Skin prepped in a sterile fashion.  Local anesthesia: Topical Ethyl chloride.  With sterile technique and under real time ultrasound guidance: With a 22-gauge 2 inch needle patient was injected with 4 cc of 0.5% Marcaine and aspirated 75 cc of straw-colored fluid injected 1 cc of Kenalog 40 mg/dL. This was from a posterior approach.  Completed without difficulty  Pain immediately resolved suggesting accurate placement of the medication.  Advised to call if fevers/chills, erythema, induration, drainage, or persistent bleeding. .  Impression: Technically successful ultrasound guided injection.   Impression and Recommendations:    The above  documentation has been reviewed and is accurate and complete Judi Saa, DO

## 2023-02-15 ENCOUNTER — Other Ambulatory Visit: Payer: Self-pay

## 2023-02-15 ENCOUNTER — Encounter: Payer: Self-pay | Admitting: Family Medicine

## 2023-02-15 ENCOUNTER — Ambulatory Visit (INDEPENDENT_AMBULATORY_CARE_PROVIDER_SITE_OTHER): Payer: Medicare Other | Admitting: Family Medicine

## 2023-02-15 VITALS — BP 132/88 | HR 72 | Ht 66.0 in | Wt 112.0 lb

## 2023-02-15 DIAGNOSIS — M545 Low back pain, unspecified: Secondary | ICD-10-CM

## 2023-02-15 DIAGNOSIS — M25561 Pain in right knee: Secondary | ICD-10-CM | POA: Diagnosis not present

## 2023-02-15 DIAGNOSIS — M51369 Other intervertebral disc degeneration, lumbar region without mention of lumbar back pain or lower extremity pain: Secondary | ICD-10-CM | POA: Insufficient documentation

## 2023-02-15 DIAGNOSIS — M5136 Other intervertebral disc degeneration, lumbar region: Secondary | ICD-10-CM | POA: Diagnosis not present

## 2023-02-15 DIAGNOSIS — G8929 Other chronic pain: Secondary | ICD-10-CM

## 2023-02-15 DIAGNOSIS — M25562 Pain in left knee: Secondary | ICD-10-CM | POA: Diagnosis not present

## 2023-02-15 DIAGNOSIS — M1711 Unilateral primary osteoarthritis, right knee: Secondary | ICD-10-CM

## 2023-02-15 DIAGNOSIS — M1712 Unilateral primary osteoarthritis, left knee: Secondary | ICD-10-CM | POA: Diagnosis not present

## 2023-02-15 NOTE — Assessment & Plan Note (Signed)
Aspiration of Baker's cyst again.  Discussed with patient that there is severe arthritic changes.  He still wants to avoid any surgical intervention.  Patient is the primary caregiver for her husband who is still discussed posture and analysis, strengthening exercises, what to do at the gym.  Will follow-up again in 6 to 8 weeks

## 2023-02-15 NOTE — Patient Instructions (Addendum)
Aspirated B knees today Lumbar spine xray today [place] [place], [place] [facility] See me in 12 weeks

## 2023-02-15 NOTE — Assessment & Plan Note (Signed)
Return to issue with worsening symptoms for this chronic problem.  Discussed with patient about icing regimen at home exercises, which activities to do no choice to avoid, increase activity slowly.  Follow-up with me again in 6 to 8 weeks still wants to avoid surgical intervention secondary to patient's age as well.  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

## 2023-02-15 NOTE — Assessment & Plan Note (Signed)
Severe scoliosis and history of osteoporosis.  Will get x-rays to rule out any type of compression fracture that could be contributing to some of the back pain

## 2023-03-02 ENCOUNTER — Other Ambulatory Visit (HOSPITAL_BASED_OUTPATIENT_CLINIC_OR_DEPARTMENT_OTHER): Payer: Self-pay

## 2023-03-02 MED ORDER — PNEUMOCOCCAL 20-VAL CONJ VACC 0.5 ML IM SUSY
0.5000 mL | PREFILLED_SYRINGE | Freq: Once | INTRAMUSCULAR | 0 refills | Status: AC
  Filled 2023-03-02: qty 0.5, 1d supply, fill #0

## 2023-05-09 NOTE — Progress Notes (Unsigned)
[facility] [address] [place] [address] Phone: [phone] Subjective:   INadine Counts, am serving as a scribe for Dr. Antoine Primas.  I'm seeing this patient by the request  of:  Pcp, No  CC: Bilateral knee pain  [id number]  02/15/2023 Return to issue with worsening symptoms for this chronic problem.  Discussed with patient about icing regimen at home exercises, which activities to do no choice to avoid, increase activity slowly.  Follow-up with me again in 6 to 8 weeks still wants to avoid surgical intervention secondary to patient's age as well.  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     Aspiration of Baker's cyst again.  Discussed with patient that there is severe arthritic changes.  He still wants to avoid any surgical intervention.  Patient is the primary caregiver for her husband who is still discussed posture and analysis, strengthening exercises, what to do at the gym.  Will follow-up again in 6 to 8 weeks      Update 05/10/2023 Abigail [name] is a 82 y.o. female coming in with complaint of B knee pain. Patient states left knee probably needs to be drained. R is still bothersome. Patient states that it is still fullness noted in the posterior aspect of the knees.  States that that is more disconcerting than the instability of the arthritis of the knee.     Past Medical History:  Diagnosis Date   Arthritis    oa   Baker's cyst    behind left knee   Constipation    Hemorrhoids    Hypertension    MVP (mitral valve prolapse)    Neoplasm    of appendix   Rosacea    Past Surgical History:  Procedure Laterality Date   AUGMENTATION MAMMAPLASTY Bilateral 1990   saline   CARDIAC CATHETERIZATION  17years ago   small amount of blockage   CATRACTS Bilateral    COLONOSCOPY  2006 and oct 2018   ILEOCECETOMY N/A 08/17/2017   Procedure: OPEN ILEOCECETOMY;  Surgeon: Darnell Level, MD;  Location: [facility];  Service: General;   Laterality: N/A;  [facility] PATH[facility]AY   Social History   Socioeconomic History   Marital status: Married    Spouse name: Not on file   Number of children: Not on file   Years of education: Not on file   Highest education level: Not on file  Occupational History   Not on file  Tobacco Use   Smoking status: Former    Current packs/day: 0.50    Average packs/day: 0.5 packs/day for 10.0 years (5.0 ttl pk-yrs)    Types: Cigarettes   Smokeless tobacco: Never   Tobacco comments:    quit 35 yrs ago  Vaping Use   Vaping status: Never Used  Substance and Sexual Activity   Alcohol use: Yes    Alcohol/week: 7.0 standard drinks of alcohol    Types: 7 Glasses of wine per week    Comment: 1 glass red wine  per night   Drug use: No   Sexual activity: Not on file  Other Topics Concern   Not on file  Social History Narrative   Not on file   Social Determinants of Health   Financial Resource Strain: Not on file  Food Insecurity: Not on file  Transportation Needs: Not on file  Physical Activity: Not on file  Stress: Not on file  Social Connections: Not on file   Allergies  Allergen  Reactions   Statins Diarrhea and Nausea And Vomiting   Family History  Problem Relation Age of Onset   Colon cancer Cousin        48's maybe     Current Outpatient Medications (Cardiovascular):    losartan (COZAAR) 100 MG tablet, Take 100 mg by mouth daily.   Current Outpatient Medications (Analgesics):    Acetaminophen-Aspirin Buffered 250-250 MG tablet, Take 0.05 tablets by mouth 2 (two) times daily.   Current Outpatient Medications (Other):    COVID-19 mRNA bivalent vaccine, Pfizer, (PFIZER COVID-19 VAC BIVALENT) injection, Inject into the muscle.   COVID-19 mRNA Vac-TriS, Pfizer, (PFIZER-BIONT COVID-19 VAC-TRIS) SUSP injection, Inject into the muscle.   COVID-19 mRNA vaccine 2023-2024 (COMIRNATY) syringe, Inject into the muscle.   famotidine-calcium carbonate-magnesium hydroxide (PEPCID COMPLETE)  10-800-165 MG chewable tablet, Chew 1 tablet by mouth daily as needed.   gabapentin (NEURONTIN) 100 MG capsule, Take 2 capsules (200 mg total) by mouth at bedtime.   influenza vaccine adjuvanted (FLUAD QUADRIVALENT) 0.5 ML injection, Inject into the muscle.   influenza vaccine adjuvanted (FLUAD) 0.5 ML injection, Inject into the muscle.   LORazepam (ATIVAN) 0.5 MG tablet, Take 0.5 mg by mouth 2 (two) times daily as needed for anxiety.   RSV vaccine recomb adjuvanted (AREXVY) 120 MCG/0.5ML injection, Inject into the muscle.   Reviewed prior external information including notes and imaging from  primary care provider As well as notes that were available from care everywhere and other healthcare systems.  Past medical history, social, surgical and family history all reviewed in electronic medical record.  No pertanent information unless stated regarding to the chief complaint.   Review of Systems:  No headache, visual changes, nausea, vomiting, diarrhea, constipation, dizziness, abdominal pain, skin rash, fevers, chills, night sweats, weight loss, swollen lymph nodes, body aches, joint swelling, chest pain, shortness of breath, mood changes. POSITIVE muscle aches  Objective  Blood pressure 120/88, pulse 68, height 5\' 6"  (1.676 m), weight 116 lb (52.6 kg), SpO2 96%.   General: No apparent distress alert and oriented x3 mood and affect normal, dressed appropriately.  HEENT: Pupils equal, extraocular movements intact  Respiratory: Patient's speak in full sentences and does not appear short of breath  Cardiovascular: No lower extremity edema, non tender, no erythema  Severely antalgic gait noted.  Favoring the knees.  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   Procedure: Real-time Ultrasound Guided aspiration and injection of right knee Device: GE Logiq Q7 Ultrasound guided injection is preferred based studies that show increased duration, increased  effect, greater accuracy, decreased procedural pain, increased response rate, and decreased cost with ultrasound guided versus blind injection.  Verbal informed consent obtained.  Time-out conducted.  Noted no overlying erythema, induration, or other signs of local infection.  Skin prepped in a sterile fashion.  Local anesthesia: Topical Ethyl chloride.  With sterile technique and under real time ultrasound guidance: With a 22-gauge 2 inch needle patient was injected with 4 cc of 0.5% Marcaine and aspirated 30 cc of straw light-colored fluid 1 cc of Kenalog 40 mg/dL. This was from a posterior approach.  Completed without difficulty  Pain immediately resolved suggesting accurate placement of the medication.  Advised to call if fevers/chills, erythema, induration, drainage, or persistent bleeding.   Impression: Technically successful ultrasound guided injection.  Procedure: Real-time Ultrasound Guided aspiration and injection of left knee Device: GE Logiq Q7 Ultrasound guided injection is preferred based studies that show increased duration, increased effect,  greater accuracy, decreased procedural pain, increased response rate, and decreased cost with ultrasound guided versus blind injection.  Verbal informed consent obtained.  Time-out conducted.  Noted no overlying erythema, induration, or other signs of local infection.  Skin prepped in a sterile fashion.  Local anesthesia: Topical Ethyl chloride.  With sterile technique and under real time ultrasound guidance: With a 22-gauge 2 inch needle patient was injected with 4 cc of 0.5% Marcaine and aspirated 85 cc of sterile light-colored fluid with gel-like material then 1 cc of Kenalog 40 mg/dL. This was from a posterior approach.  Completed without difficulty  Pain immediately improved suggesting accurate placement of the medication.  Advised to call if fevers/chills, erythema, induration, drainage, or persistent bleeding.   Impression:  Technically successful ultrasound guided injection.   Impression and Recommendations:    The above documentation has been reviewed and is accurate and complete Judi Saa, DO

## 2023-05-10 ENCOUNTER — Other Ambulatory Visit: Payer: Self-pay

## 2023-05-10 ENCOUNTER — Encounter: Payer: Self-pay | Admitting: Family Medicine

## 2023-05-10 ENCOUNTER — Ambulatory Visit (INDEPENDENT_AMBULATORY_CARE_PROVIDER_SITE_OTHER): Payer: Medicare Other | Admitting: Family Medicine

## 2023-05-10 VITALS — BP 120/88 | HR 68 | Ht 66.0 in | Wt 116.0 lb

## 2023-05-10 DIAGNOSIS — M7120 Synovial cyst of popliteal space [Baker], unspecified knee: Secondary | ICD-10-CM

## 2023-05-10 DIAGNOSIS — M25561 Pain in right knee: Secondary | ICD-10-CM

## 2023-05-10 DIAGNOSIS — M25562 Pain in left knee: Secondary | ICD-10-CM | POA: Diagnosis not present

## 2023-05-10 DIAGNOSIS — G8929 Other chronic pain: Secondary | ICD-10-CM

## 2023-05-10 NOTE — Patient Instructions (Signed)
Drained both knees See you again in 3 months

## 2023-05-10 NOTE — Assessment & Plan Note (Signed)
Bilateral aspirations again done today.  Discussed with patient about icing regimen and home exercises.  Discussed if worsening symptoms would need to consider the possibility of surgical intervention.  Patient hopefully will do well this time.  Increase activity slowly otherwise.  Follow-up with me again 12 weeks

## 2023-06-22 ENCOUNTER — Other Ambulatory Visit: Payer: Self-pay | Admitting: Registered Nurse

## 2023-06-22 DIAGNOSIS — Z1231 Encounter for screening mammogram for malignant neoplasm of breast: Secondary | ICD-10-CM

## 2023-07-11 ENCOUNTER — Ambulatory Visit: Payer: Medicare Other

## 2023-07-12 ENCOUNTER — Other Ambulatory Visit (HOSPITAL_BASED_OUTPATIENT_CLINIC_OR_DEPARTMENT_OTHER): Payer: Self-pay

## 2023-07-12 MED ORDER — INFLUENZA VAC A&B SURF ANT ADJ 0.5 ML IM SUSY
0.5000 mL | PREFILLED_SYRINGE | Freq: Once | INTRAMUSCULAR | 0 refills | Status: AC
  Filled 2023-07-12: qty 0.5, 1d supply, fill #0

## 2023-07-12 MED ORDER — COVID-19 MRNA VAC-TRIS(PFIZER) 30 MCG/0.3ML IM SUSY
0.3000 mL | PREFILLED_SYRINGE | Freq: Once | INTRAMUSCULAR | 0 refills | Status: AC
  Filled 2023-07-12: qty 0.3, 1d supply, fill #0

## 2023-07-24 DIAGNOSIS — M810 Age-related osteoporosis without current pathological fracture: Secondary | ICD-10-CM | POA: Diagnosis not present

## 2023-07-24 DIAGNOSIS — Z0000 Encounter for general adult medical examination without abnormal findings: Secondary | ICD-10-CM | POA: Diagnosis not present

## 2023-07-24 DIAGNOSIS — E038 Other specified hypothyroidism: Secondary | ICD-10-CM | POA: Diagnosis not present

## 2023-07-28 DIAGNOSIS — Z0000 Encounter for general adult medical examination without abnormal findings: Secondary | ICD-10-CM | POA: Diagnosis not present

## 2023-07-28 DIAGNOSIS — I2510 Atherosclerotic heart disease of native coronary artery without angina pectoris: Secondary | ICD-10-CM | POA: Diagnosis not present

## 2023-07-28 DIAGNOSIS — E559 Vitamin D deficiency, unspecified: Secondary | ICD-10-CM | POA: Diagnosis not present

## 2023-07-28 DIAGNOSIS — E038 Other specified hypothyroidism: Secondary | ICD-10-CM | POA: Diagnosis not present

## 2023-07-28 DIAGNOSIS — G720 Drug-induced myopathy: Secondary | ICD-10-CM | POA: Diagnosis not present

## 2023-07-28 DIAGNOSIS — I10 Essential (primary) hypertension: Secondary | ICD-10-CM | POA: Diagnosis not present

## 2023-07-28 DIAGNOSIS — T466X5A Adverse effect of antihyperlipidemic and antiarteriosclerotic drugs, initial encounter: Secondary | ICD-10-CM | POA: Diagnosis not present

## 2023-07-28 DIAGNOSIS — Z23 Encounter for immunization: Secondary | ICD-10-CM | POA: Diagnosis not present

## 2023-08-01 ENCOUNTER — Ambulatory Visit
Admission: RE | Admit: 2023-08-01 | Discharge: 2023-08-01 | Disposition: A | Discharge status: Home / Self Care | Payer: Medicare Other | Source: Ambulatory Visit | Attending: Registered Nurse | Admitting: Registered Nurse

## 2023-08-01 DIAGNOSIS — Z1231 Encounter for screening mammogram for malignant neoplasm of breast: Secondary | ICD-10-CM

## 2023-08-08 NOTE — Progress Notes (Unsigned)
[facility] [address] [place] [address] Phone: [phone] Subjective:   Bruce Donath, am serving as a scribe for Dr. Antoine Primas.  I'm seeing this patient by the request  of:  Pcp, No  CC: Bilateral knee pain and swelling  [id number]  05/10/2023 Bilateral aspirations again done today. Discussed with patient about icing regimen and home exercises. Discussed if worsening symptoms would need to consider the possibility of surgical intervention. Patient hopefully will do well this time. Increase activity slowly otherwise. Follow-up with me again 12 weeks   Update 08/09/2023 Abigail [name] is a 82 y.o. female coming in with complaint of B knee pain. Patient states that she has medial joint pain in R knee. Has not had pain in back of the R knee. Having pain in back of L knee as well. Feels like she is unable to gain strength in the knees at the gym.       Past Medical History:  Diagnosis Date   Arthritis    oa   Baker's cyst    behind left knee   Constipation    Hemorrhoids    Hypertension    MVP (mitral valve prolapse)    Neoplasm    of appendix   Rosacea    Past Surgical History:  Procedure Laterality Date   AUGMENTATION MAMMAPLASTY Bilateral 1990   saline   CARDIAC CATHETERIZATION  17years ago   small amount of blockage   CATRACTS Bilateral    COLONOSCOPY  2006 and oct 2018   ILEOCECETOMY N/A 08/17/2017   Procedure: OPEN ILEOCECETOMY;  Surgeon: Darnell Level, MD;  Location: [facility];  Service: General;  Laterality: N/A;  [facility]   Social History   Socioeconomic History   Marital status: Married    Spouse name: Not on file   Number of children: Not on file   Years of education: Not on file   Highest education level: Not on file  Occupational History   Not on file  Tobacco Use   Smoking status: Former    Current packs/day: 0.50    Average packs/day: 0.5 packs/day for 10.0 years (5.0 ttl pk-yrs)    Types:  Cigarettes   Smokeless tobacco: Never   Tobacco comments:    quit 35 yrs ago  Vaping Use   Vaping status: Never Used  Substance and Sexual Activity   Alcohol use: Yes    Alcohol/week: 7.0 standard drinks of alcohol    Types: 7 Glasses of wine per week    Comment: 1 glass red wine  per night   Drug use: No   Sexual activity: Not on file  Other Topics Concern   Not on file  Social History Narrative   Not on file   Social Determinants of Health   Financial Resource Strain: Not on file  Food Insecurity: Not on file  Transportation Needs: Not on file  Physical Activity: Not on file  Stress: Not on file  Social Connections: Not on file   Allergies  Allergen Reactions   Statins Diarrhea and Nausea And Vomiting   Family History  Problem Relation Age of Onset   Colon cancer Cousin        69's maybe     Current Outpatient Medications (Cardiovascular):    losartan (COZAAR) 100 MG tablet, Take 100 mg by mouth daily.   Current Outpatient Medications (Analgesics):    Acetaminophen-Aspirin Buffered 250-250 MG tablet, Take 0.05 tablets by mouth 2 (two) times  daily.   Current Outpatient Medications (Other):    COVID-19 mRNA bivalent vaccine, Pfizer, (PFIZER COVID-19 VAC BIVALENT) injection, Inject into the muscle.   COVID-19 mRNA Vac-TriS, Pfizer, (PFIZER-BIONT COVID-19 VAC-TRIS) SUSP injection, Inject into the muscle.   COVID-19 mRNA vaccine 2023-2024 (COMIRNATY) syringe, Inject into the muscle.   famotidine-calcium carbonate-magnesium hydroxide (PEPCID COMPLETE) 10-800-165 MG chewable tablet, Chew 1 tablet by mouth daily as needed.   influenza vaccine adjuvanted (FLUAD QUADRIVALENT) 0.5 ML injection, Inject into the muscle.   influenza vaccine adjuvanted (FLUAD) 0.5 ML injection, Inject into the muscle.   LORazepam (ATIVAN) 0.5 MG tablet, Take 0.5 mg by mouth 2 (two) times daily as needed for anxiety.   RSV vaccine recomb adjuvanted (AREXVY) 120 MCG/0.5ML injection, Inject into  the muscle.   gabapentin (NEURONTIN) 100 MG capsule, Take 2 capsules (200 mg total) by mouth at bedtime.   Reviewed prior external information including notes and imaging from  primary care provider As well as notes that were available from care everywhere and other healthcare systems.  Past medical history, social, surgical and family history all reviewed in electronic medical record.  No pertanent information unless stated regarding to the chief complaint.   Review of Systems:  No headache, visual changes, nausea, vomiting, diarrhea, constipation, dizziness, abdominal pain, skin rash, fevers, chills, night sweats, weight loss, swollen lymph nodes, body aches, joint swelling, chest pain, shortness of breath, mood changes. POSITIVE muscle aches  Objective  Blood pressure 116/88, pulse 68, height 5\' 6"  (1.676 m), weight 116 lb (52.6 kg).   General: No apparent distress alert and oriented x3 mood and affect normal, dressed appropriately.  HEENT: Pupils equal, extraocular movements intact  Respiratory: Patient's speak in full sentences and does not appear short of breath  Cardiovascular: No lower extremity edema, non tender, no erythema  No knee exam show the patient does have crepitus noted.  Some tenderness to palpation in the knee and the posterior aspect of the knee.  Moderate to severe overall.  Procedure: Real-time Ultrasound Guided Injection of right knee Baker's cyst Device: GE Logiq Q7 Ultrasound guided injection is preferred based studies that show increased duration, increased effect, greater accuracy, decreased procedural pain, increased response rate, and decreased cost with ultrasound guided versus blind injection.  Verbal informed consent obtained.  Time-out conducted.  Noted no overlying erythema, induration, or other signs of local infection.  Skin prepped in a sterile fashion.  Local anesthesia: Topical Ethyl chloride.  With sterile technique and under real time  ultrasound guidance: With a 22-gauge 2 inch needle patient was injected with 4 cc of 0.5% Marcaine and aspirated 30 cc of straw-colored fluid 1 cc of Kenalog 40 mg/dL. This was from a posterior approach.  Completed without difficulty  Pain immediately resolved suggesting accurate placement of the medication.  Advised to call if fevers/chills, erythema, induration, drainage, or persistent bleeding.  Impression: Technically successful ultrasound guided injection.  Procedure: Real-time Ultrasound Guided Injection of left knee Device: GE Logiq Q7 Ultrasound guided injection is preferred based studies that show increased duration, increased effect, greater accuracy, decreased procedural pain, increased response rate, and decreased cost with ultrasound guided versus blind injection.  Verbal informed consent obtained.  Time-out conducted.  Noted no overlying erythema, induration, or other signs of local infection.  Skin prepped in a sterile fashion.  Local anesthesia: Topical Ethyl chloride.  With sterile technique and under real time ultrasound guidance: With a 22-gauge 2 inch needle patient was injected with 4 cc of 0.5% Marcaine and  60 cc of sterile light-colored fluid 1 cc of Kenalog 40 mg/dL. This was from a posterior approach.  Completed without difficulty  Pain immediately resolved suggesting accurate placement of the medication.  Advised to call if fevers/chills, erythema, induration, drainage, or persistent bleeding.  Impression: Technically successful ultrasound guided injection.    Impression and Recommendations:     The above documentation has been reviewed and is accurate and complete Judi Saa, DO

## 2023-08-09 ENCOUNTER — Other Ambulatory Visit: Payer: Self-pay

## 2023-08-09 ENCOUNTER — Ambulatory Visit: Payer: Medicare Other | Admitting: Family Medicine

## 2023-08-09 ENCOUNTER — Encounter: Payer: Self-pay | Admitting: Family Medicine

## 2023-08-09 VITALS — BP 116/88 | HR 68 | Ht 66.0 in | Wt 116.0 lb

## 2023-08-09 DIAGNOSIS — G8929 Other chronic pain: Secondary | ICD-10-CM | POA: Diagnosis not present

## 2023-08-09 DIAGNOSIS — M7120 Synovial cyst of popliteal space [Baker], unspecified knee: Secondary | ICD-10-CM

## 2023-08-09 DIAGNOSIS — M25561 Pain in right knee: Secondary | ICD-10-CM | POA: Diagnosis not present

## 2023-08-09 DIAGNOSIS — M25562 Pain in left knee: Secondary | ICD-10-CM

## 2023-08-09 NOTE — Assessment & Plan Note (Signed)
Bilateral aspiration done today.  Tolerated the procedure well.  Hopeful that this will help.  Restart formal physical therapy secondary to the acute increase in discomfort and patient not being able to be as active.  Depending on how patient responds we will continue to monitor.  Follow-up again in 8 to 10 weeks.  Still holding off on any surgical intervention.

## 2023-08-09 NOTE — Patient Instructions (Signed)
Aspiration of both knees See me again in 3 months

## 2023-08-21 ENCOUNTER — Other Ambulatory Visit (HOSPITAL_BASED_OUTPATIENT_CLINIC_OR_DEPARTMENT_OTHER): Payer: Self-pay

## 2023-08-28 DIAGNOSIS — L820 Inflamed seborrheic keratosis: Secondary | ICD-10-CM | POA: Diagnosis not present

## 2023-08-28 DIAGNOSIS — L718 Other rosacea: Secondary | ICD-10-CM | POA: Diagnosis not present

## 2023-08-28 DIAGNOSIS — Z85828 Personal history of other malignant neoplasm of skin: Secondary | ICD-10-CM | POA: Diagnosis not present

## 2023-08-28 DIAGNOSIS — D225 Melanocytic nevi of trunk: Secondary | ICD-10-CM | POA: Diagnosis not present

## 2023-08-28 DIAGNOSIS — Z08 Encounter for follow-up examination after completed treatment for malignant neoplasm: Secondary | ICD-10-CM | POA: Diagnosis not present

## 2023-09-26 ENCOUNTER — Encounter: Payer: Self-pay | Admitting: Cardiovascular Disease

## 2023-09-26 ENCOUNTER — Ambulatory Visit: Payer: Medicare Other | Attending: Cardiovascular Disease | Admitting: Cardiovascular Disease

## 2023-09-26 VITALS — BP 159/100 | HR 83 | Ht 66.0 in | Wt 113.6 lb

## 2023-09-26 DIAGNOSIS — I10 Essential (primary) hypertension: Secondary | ICD-10-CM

## 2023-09-26 DIAGNOSIS — I700 Atherosclerosis of aorta: Secondary | ICD-10-CM

## 2023-09-26 DIAGNOSIS — I059 Rheumatic mitral valve disease, unspecified: Secondary | ICD-10-CM | POA: Diagnosis not present

## 2023-09-26 DIAGNOSIS — E7800 Pure hypercholesterolemia, unspecified: Secondary | ICD-10-CM | POA: Diagnosis not present

## 2023-09-26 NOTE — Progress Notes (Unsigned)
Cardiology New Patient Office Note:    Date:  09/27/2023   ID:  ARIS STRELOW, DOB 1941/09/13, MRN [id number]  PCP:  Pcp, No  [facility] Cardiologist:  None  [facility] Electrophysiologist:  None   Referring MD: No ref. provider found   Chief Complaint  Patient presents with   Hyperlipidemia    History of Present Illness:    Abigail [name] is a 82 y.o. female with a hx of remarkably good health other than well treated hypertension.  Her husband Abigail [name] is also my patient.  We previously met in 2021 to discuss management of mild hypercholesterolemia.  She returns to discuss the same topic after undergoing coronary calcium score (absolute score 124, 55th percentile for age and gender).  Aortic atherosclerosis was also seen  She is very physically active and completely asymptomatic. The patient specifically denies any chest pain at rest or with exertion, dyspnea at rest or with exertion, orthopnea, paroxysmal nocturnal dyspnea, syncope, palpitations, focal neurological deficits, intermittent claudication, lower extremity edema, unexplained weight gain, cough, hemoptysis or wheezing.   Her most recent lipid profile shows a total cholesterol 208 with an elevated LDL cholesterol 118, but an excellent HDL at 75 and triglycerides 84.  She was diagnosed with mitral valve prolapse based on physical exam in the 70s, seemingly confirmed that left ventriculography in 2007, but subsequent echocardiography showed no evidence of mitral valve prolapse or mitral insufficiency.  She had cardiac catheterization in 2007 which showed "smooth 40% segmental proximal LAD disease with mild calcification and 30% eccentric mid RCA disease".  She has not been receiving statin therapy.  Multiple attempts to treat her hypercholesterolemia the past have been unsuccessful.  She tried both atorvastatin and rosuvastatin but these induced visual changes.  She has recently been prescribed pravastatin but after taking  this for just a few doses she began to develop heartburn and stopped it with resolution of symptoms.  She has a longstanding history of situational white coat hypertension when going to medical offices that she is less familiar with.  When she was seen on 08/09/2023 at her sports medicine doctor's office her blood pressure was only 116/88; when she was last seen in the PCPs office her blood pressure was 118/60.  Her blood pressure today is elevated at 156/108 at intake and was still elevated at 159/100 when I saw her 10 minutes later.  Past Medical History:  Diagnosis Date   Arthritis    oa   Baker's cyst    behind left knee   Constipation    Hemorrhoids    Hypertension    MVP (mitral valve prolapse)    Neoplasm    of appendix   Rosacea     Past Surgical History:  Procedure Laterality Date   AUGMENTATION MAMMAPLASTY Bilateral 1990   saline   CARDIAC CATHETERIZATION  17years ago   small amount of blockage   CATRACTS Bilateral    COLONOSCOPY  2006 and oct 2018   ILEOCECETOMY N/A 08/17/2017   Procedure: OPEN ILEOCECETOMY;  Surgeon: Darnell Level, MD;  Location: [facility];  Service: General;  Laterality: N/A;  ERAS PATHWAY    Current Medications: Current Meds  Medication Sig   Acetaminophen-Aspirin Buffered 250-250 MG tablet Take 0.05 tablets by mouth as needed.   Ascorbic Acid (VITAMIN C PO) Take 1 tablet by mouth daily at 6 (six) AM.   aspirin EC 81 MG tablet Take 81 mg by mouth daily.   Cyanocobalamin (VITAMIN B-12 PO) Take  1 capsule by mouth daily at 6 (six) AM.   famotidine-calcium carbonate-magnesium hydroxide (PEPCID COMPLETE) 10-800-165 MG chewable tablet Chew 1 tablet by mouth daily as needed.   losartan (COZAAR) 100 MG tablet Take 100 mg by mouth daily.   MAGNESIUM GLYCINATE PO Take 200 mg of amoxicillin by mouth at bedtime.     Allergies:   Statins   Social History   Socioeconomic History   Marital status: Married    Spouse name: Not on file   Number of children:  Not on file   Years of education: Not on file   Highest education level: Not on file  Occupational History   Not on file  Tobacco Use   Smoking status: Former    Current packs/day: 0.50    Average packs/day: 0.5 packs/day for 10.0 years (5.0 ttl pk-yrs)    Types: Cigarettes   Smokeless tobacco: Never   Tobacco comments:    quit 35 yrs ago  Vaping Use   Vaping status: Never Used  Substance and Sexual Activity   Alcohol use: Yes    Alcohol/week: 7.0 standard drinks of alcohol    Types: 7 Glasses of wine per week    Comment: 1 glass red wine  per night   Drug use: No   Sexual activity: Not on file  Other Topics Concern   Not on file  Social History Narrative   Not on file   Social Determinants of Health   Financial Resource Strain: Not on file  Food Insecurity: Not on file  Transportation Needs: Not on file  Physical Activity: Not on file  Stress: Not on file  Social Connections: Not on file     Family History: The patient's family history includes Colon cancer in her cousin.  ROS:   Please see the history of present illness.     All other systems reviewed and are negative.  EKGs/Labs/Other Studies Reviewed:    The following studies were reviewed today: Cardiac catheterization report from 2007, images not available.  EKG:    EKG Interpretation Date/Time:  Tuesday September 26 2023 10:37:43 EST Ventricular Rate:  83 PR Interval:  154 QRS Duration:  70 QT Interval:  342 QTC Calculation: 401 R Axis:   -13  Text Interpretation: Normal sinus rhythm Normal ECG When compared with ECG of 18-Apr-2018 16:19, Vent. rate has increased BY  29 BPM Criteria for Anteroseptal infarct are no longer Present Confirmed by Darick Fetters [id number]) on 09/26/2023 10:50:46 AM         Recent Labs: No results found for requested labs within last 365 days.  Recent Lipid Panel No results found for: "CHOL", "TRIG", "HDL", "CHOLHDL", "VLDL", "LDLCALC", "LDLDIRECT"   Labs August 2021  [facility] medical Hemoglobin 15, creatinine 0.88, TSH 1.11, normal liver function tests Total cholesterol 219, LDL 77, triglycerides 98, LDL 125  07/24/2023 cholesterol 208, HDL 75, LDL 118, triglycerides 84  Physical Exam:    VS:  BP (!) 159/100   Pulse 83   Ht 5\' 6"  (1.676 m)   Wt 113 lb 9.6 oz (51.5 kg)   SpO2 94%   BMI 18.34 kg/m     Wt Readings from Last 3 Encounters:  09/26/23 113 lb 9.6 oz (51.5 kg)  08/09/23 116 lb (52.6 kg)  05/10/23 116 lb (52.6 kg)     GEN:  Well nourished, well developed in no acute distress HEENT: Normal NECK: No JVD; No carotid bruits LYMPHATICS: No lymphadenopathy CARDIAC: RRR with rare ectopy, no murmurs, rubs,  gallops I do not hear a systolic click or murmur either at rest or with provocative maneuvers. RESPIRATORY:  Clear to auscultation without rales, wheezing or rhonchi  ABDOMEN: Soft, non-tender, non-distended MUSCULOSKELETAL:  No edema; No deformity  SKIN: Warm and dry NEUROLOGIC:  Alert and oriented x 3 PSYCHIATRIC:  Normal affect   ASSESSMENT:    1. Hypercholesterolemia   2. Primary hypertension   3. Mitral valve disorder   4. Aortic atherosclerosis (HCC)    PLAN:    In order of problems listed above:  HLP: Her current coronary calcium score shows average atherosclerosis burden.  She has not had clinically relevant coronary or peripheral arterial disease by age 4.  Despite a moderately elevated LDL cholesterol she has an exceptionally good HDL cholesterol.  She is physically active, lean and fit.  She has had serious side effects from numerous statins in the past.  I do not think she needs lipid-lowering therapy. MVP: Described in 2007 on LV angiogram, but without audible click or murmur.  Not confirmed on subsequent echocardiogram. HTN: Well controlled on losartan monotherapy.  She has superimposed situational hypertension.  No changes were made to medications today. Aortic atherosclerosis: Incidentally noted.  Normal caliber  aorta and the visualized segments.   Medication Adjustments/Labs and Tests Ordered: Current medicines are reviewed at length with the patient today.  Concerns regarding medicines are outlined above.  Orders Placed This Encounter  Procedures   EKG 12-Lead   No orders of the defined types were placed in this encounter.   Patient Instructions  Medication Instructions:  No changes *If you need a refill on your cardiac medications before your next appointment, please call your pharmacy*  Follow-Up: At [facility], you and your health needs are our priority.  As part of our continuing mission to provide you with exceptional heart care, we have created designated Provider Care Teams.  These Care Teams include your primary Cardiologist (physician) and Advanced Practice Providers (APPs -  Physician Assistants and Nurse Practitioners) who all work together to provide you with the care you need, when you need it.  We recommend signing up for the patient portal called "MyChart".  Sign up information is provided on this After Visit Summary.  MyChart is used to connect with patients for Virtual Visits (Telemedicine).  Patients are able to view lab/test results, encounter notes, upcoming appointments, etc.  Non-urgent messages can be sent to your provider as well.   To learn more about what you can do with MyChart, go to [email or web address].    Your next appointment:    Follow up as needed  Provider:   Dr Royann Shivers    Signed, Thurmon Fair, MD  09/27/2023 4:16 PM    [facility]

## 2023-09-26 NOTE — Patient Instructions (Signed)
Medication Instructions:  No changes *If you need a refill on your cardiac medications before your next appointment, please call your pharmacy*  Follow-Up: At [facility], you and your health needs are our priority.  As part of our continuing mission to provide you with exceptional heart care, we have created designated Provider Care Teams.  These Care Teams include your primary Cardiologist (physician) and Advanced Practice Providers (APPs -  Physician Assistants and Nurse Practitioners) who all work together to provide you with the care you need, when you need it.  We recommend signing up for the patient portal called "MyChart".  Sign up information is provided on this After Visit Summary.  MyChart is used to connect with patients for Virtual Visits (Telemedicine).  Patients are able to view lab/test results, encounter notes, upcoming appointments, etc.  Non-urgent messages can be sent to your provider as well.   To learn more about what you can do with MyChart, go to [email or web address].    Your next appointment:    Follow up as needed  Provider:   Dr Sallyanne Kuster

## 2023-10-23 NOTE — Progress Notes (Signed)
 [name] [address] [place] [address] Phone: [phone] Subjective:    I'm seeing this patient by the request  of:  Pcp, No  CC: bilateral knee pain   YEP:Dlagzrupcz  08/09/2023 Bilateral aspiration done today.  Tolerated the procedure well.  Hopeful that this will help.  Restart formal physical therapy secondary to the acute increase in discomfort and patient not being able to be as active.  Depending on how patient responds we will continue to monitor.  Follow-up again in 8 to 10 weeks.  Still holding off on any surgical intervention.     Update 10/30/2023 Abigail [name] is a 82 y.o. female coming in with complaint of B knee pain. Patient states the knees are like they always are. The baker cyst ready to be drained but having new left hip/lower buttock pain.       Past Medical History:  Diagnosis Date   Arthritis    oa   Baker's cyst    behind left knee   Constipation    Hemorrhoids    Hypertension    MVP (mitral valve prolapse)    Neoplasm    of appendix   Rosacea    Past Surgical History:  Procedure Laterality Date   AUGMENTATION MAMMAPLASTY Bilateral 1990   saline   CARDIAC CATHETERIZATION  17years ago   small amount of blockage   CATRACTS Bilateral    COLONOSCOPY  2006 and oct 2018   ILEOCECETOMY N/A 08/17/2017   Procedure: OPEN ILEOCECETOMY;  Surgeon: Eletha Boas, MD;  Location: [facility];  Service: General;  Laterality: N/A;  ERAS PATHWAY   Social History   Socioeconomic History   Marital status: Married    Spouse name: Not on file   Number of children: Not on file   Years of education: Not on file   Highest education level: Not on file  Occupational History   Not on file  Tobacco Use   Smoking status: Former    Current packs/day: 0.50    Average packs/day: 0.5 packs/day for 10.0 years (5.0 ttl pk-yrs)    Types: Cigarettes   Smokeless tobacco: Never   Tobacco comments:    quit 35 yrs ago  Vaping Use    Vaping status: Never Used  Substance and Sexual Activity   Alcohol use: Yes    Alcohol/week: 7.0 standard drinks of alcohol    Types: 7 Glasses of wine per week    Comment: 1 glass red wine  per night   Drug use: No   Sexual activity: Not on file  Other Topics Concern   Not on file  Social History Narrative   Not on file   Social Drivers of Health   Financial Resource Strain: Not on file  Food Insecurity: Not on file  Transportation Needs: Not on file  Physical Activity: Not on file  Stress: Not on file  Social Connections: Not on file   Allergies  Allergen Reactions   Statins Diarrhea and Nausea And Vomiting   Family History  Problem Relation Age of Onset   Colon cancer Cousin        74's maybe     Current Outpatient Medications (Cardiovascular):    losartan  (COZAAR ) 100 MG tablet, Take 100 mg by mouth daily.   Current Outpatient Medications (Analgesics):    Acetaminophen -Aspirin Buffered 250-250 MG tablet, Take 0.05 tablets by mouth as needed.   aspirin EC 81 MG tablet, Take 81 mg by mouth daily.  Current Outpatient Medications (Hematological):    Cyanocobalamin (VITAMIN B-12 PO), Take 1 capsule by mouth daily at 6 (six) AM.  Current Outpatient Medications (Other):    Ascorbic Acid (VITAMIN C PO), Take 1 tablet by mouth daily at 6 (six) AM.   COVID-19 mRNA bivalent vaccine, Pfizer, (PFIZER COVID-19 VAC BIVALENT) injection, Inject into the muscle. (Patient not taking: Reported on 09/26/2023)   COVID-19 mRNA Vac-TriS, Pfizer, (PFIZER-BIONT COVID-19 VAC-TRIS) SUSP injection, Inject into the muscle. (Patient not taking: Reported on 09/26/2023)   COVID-19 mRNA vaccine 2023-2024 (COMIRNATY ) syringe, Inject into the muscle. (Patient not taking: Reported on 09/26/2023)   famotidine-calcium carbonate-magnesium hydroxide (PEPCID COMPLETE) 10-800-165 MG chewable tablet, Chew 1 tablet by mouth daily as needed.   influenza vaccine adjuvanted (FLUAD  QUADRIVALENT) 0.5 ML injection,  Inject into the muscle. (Patient not taking: Reported on 09/26/2023)   influenza vaccine adjuvanted (FLUAD ) 0.5 ML injection, Inject into the muscle. (Patient not taking: Reported on 09/26/2023)   LORazepam  (ATIVAN ) 0.5 MG tablet, Take 0.5 mg by mouth 2 (two) times daily as needed for anxiety. (Patient not taking: Reported on 09/26/2023)   MAGNESIUM GLYCINATE PO, Take 200 mg of amoxicillin by mouth at bedtime.   RSV vaccine recomb adjuvanted (AREXVY ) 120 MCG/0.5ML injection, Inject into the muscle. (Patient not taking: Reported on 09/26/2023)   Reviewed prior external information including notes and imaging from  primary care provider As well as notes that were available from care everywhere and other healthcare systems.  Past medical history, social, surgical and family history all reviewed in electronic medical record.  No pertanent information unless stated regarding to the chief complaint.   Review of Systems:  No headache, visual changes, nausea, vomiting, diarrhea, constipation, dizziness, abdominal pain, skin rash, fevers, chills, night sweats, weight loss, swollen lymph nodes, body aches, joint swelling, chest pain, shortness of breath, mood changes. POSITIVE muscle aches  Objective  There were no vitals taken for this visit.   General: No apparent distress alert and oriented x3 mood and affect normal, dressed appropriately.  HEENT: Pupils equal, extraocular movements intact  Respiratory: Patient's speak in full sentences and does not appear short of breath  Cardiovascular: No lower extremity edema, non tender, no erythema  Antalgic gait noted.  Knee exam shows patient does have arthritic changes noted.  Significant swelling especially in the popliteal area on the left side.  Patient does have some instability of the knees.  Procedure: Real-time Ultrasound Guided Injection of left knee Baker's cyst aspiration Device: GE Logiq Q7 Ultrasound guided injection is preferred based studies  that show increased duration, increased effect, greater accuracy, decreased procedural pain, increased response rate, and decreased cost with ultrasound guided versus blind injection.  Verbal informed consent obtained.  Time-out conducted.  Noted no overlying erythema, induration, or other signs of local infection.  Skin prepped in a sterile fashion.  Local anesthesia: Topical Ethyl chloride.  With sterile technique and under real time ultrasound guidance: With a 22-gauge 2 inch needle patient was injected with 4 cc of 0.5% Marcaine  and 70 cc of straw like fluid was removed and then injected with 1 cc of Kenalog  40 mg/dL. This was from a posterior approach.  Completed without difficulty  Pain immediately resolved suggesting accurate placement of the medication.  Advised to call if fevers/chills, erythema, induration, drainage, or persistent bleeding.  Impression: Technically successful ultrasound guided injection.  Procedure: Real-time Ultrasound Guided Injection of right knee aspiration of cyst Device: GE Logiq Q7 Ultrasound guided injection is preferred based studies  that show increased duration, increased effect, greater accuracy, decreased procedural pain, increased response rate, and decreased cost with ultrasound guided versus blind injection.  Verbal informed consent obtained.  Time-out conducted.  Noted no overlying erythema, induration, or other signs of local infection.  Skin prepped in a sterile fashion.  Local anesthesia: Topical Ethyl chloride.  With sterile technique and under real time ultrasound guidance: With a 22-gauge 2 inch needle patient was injected with 4 cc of 0.5% Marcaine  and aspirated 30 cc of straw-colored fluid then injected 1 cc of Kenalog  40 mg/dL. This was from a posterior medial secondary to it being near the hamstring tendon approach.  Completed without difficulty  Pain immediately resolved suggesting accurate placement of the medication.  Advised to call if  fevers/chills, erythema, induration, drainage, or persistent bleeding.  Images permanently stored and available for review in the ultrasound unit.  Impression: Technically successful ultrasound guided injection.    Impression and Recommendations:     The above documentation has been reviewed and is accurate and complete Sirena Riddle M Azara Gemme, DO

## 2023-10-31 ENCOUNTER — Ambulatory Visit (INDEPENDENT_AMBULATORY_CARE_PROVIDER_SITE_OTHER): Payer: Medicare Other | Admitting: Family Medicine

## 2023-10-31 ENCOUNTER — Encounter: Payer: Self-pay | Admitting: Family Medicine

## 2023-10-31 ENCOUNTER — Other Ambulatory Visit: Payer: Self-pay

## 2023-10-31 VITALS — BP 124/82 | HR 89 | Ht 66.0 in

## 2023-10-31 DIAGNOSIS — M25562 Pain in left knee: Secondary | ICD-10-CM

## 2023-10-31 DIAGNOSIS — M7122 Synovial cyst of popliteal space [Baker], left knee: Secondary | ICD-10-CM | POA: Diagnosis not present

## 2023-10-31 DIAGNOSIS — M1711 Unilateral primary osteoarthritis, right knee: Secondary | ICD-10-CM | POA: Diagnosis not present

## 2023-10-31 DIAGNOSIS — M1712 Unilateral primary osteoarthritis, left knee: Secondary | ICD-10-CM | POA: Diagnosis not present

## 2023-10-31 DIAGNOSIS — M25561 Pain in right knee: Secondary | ICD-10-CM

## 2023-10-31 DIAGNOSIS — G8929 Other chronic pain: Secondary | ICD-10-CM | POA: Diagnosis not present

## 2023-10-31 NOTE — Patient Instructions (Addendum)
 Good to see you Drained and injected both knees today Will look into the PT for you  Follow up in 10 weeks

## 2023-10-31 NOTE — Assessment & Plan Note (Signed)
 Once again bilateral aspirations done.  Patient still wants to avoid any surgical intervention if possible.  Patient has done well but does have significant arthritic changes we will need to continue to monitor.  Discussed icing regimen and home exercises.  Patient is to increase activity slowly.  Follow-up with me again in 6 to 8 weeks otherwise.

## 2023-11-01 ENCOUNTER — Ambulatory Visit: Payer: Medicare Other | Admitting: Physical Therapy

## 2023-11-08 ENCOUNTER — Ambulatory Visit: Payer: Medicare Other | Attending: Family Medicine

## 2023-11-08 ENCOUNTER — Other Ambulatory Visit: Payer: Self-pay

## 2023-11-08 DIAGNOSIS — M25561 Pain in right knee: Secondary | ICD-10-CM | POA: Insufficient documentation

## 2023-11-08 DIAGNOSIS — R293 Abnormal posture: Secondary | ICD-10-CM | POA: Insufficient documentation

## 2023-11-08 DIAGNOSIS — M25562 Pain in left knee: Secondary | ICD-10-CM | POA: Diagnosis not present

## 2023-11-08 DIAGNOSIS — M6281 Muscle weakness (generalized): Secondary | ICD-10-CM | POA: Diagnosis not present

## 2023-11-08 DIAGNOSIS — G8929 Other chronic pain: Secondary | ICD-10-CM | POA: Insufficient documentation

## 2023-11-08 DIAGNOSIS — M170 Bilateral primary osteoarthritis of knee: Secondary | ICD-10-CM | POA: Insufficient documentation

## 2023-11-08 DIAGNOSIS — R262 Difficulty in walking, not elsewhere classified: Secondary | ICD-10-CM | POA: Diagnosis not present

## 2023-11-08 DIAGNOSIS — R252 Cramp and spasm: Secondary | ICD-10-CM

## 2023-11-08 NOTE — Therapy (Signed)
 OUTPATIENT PHYSICAL THERAPY LOWER EXTREMITY EVALUATION   Patient Name: Abigail [name] MRN: [id number] DOB:[birth year], 83 y.o., female Today's Date: 11/08/2023  END OF SESSION:  PT End of Session - 11/08/23 1410     Visit Number 1    Date for PT Re-Evaluation 01/03/24    Authorization Type [employer]    Progress Note Due on Visit 10    PT Start Time 1402    PT Stop Time 1450    PT Time Calculation (min) 48 min    Activity Tolerance Patient tolerated treatment well    Behavior During Therapy WFL for tasks assessed/performed             Past Medical History:  Diagnosis Date   Arthritis    oa   Baker's cyst    behind left knee   Constipation    Hemorrhoids    Hypertension    MVP (mitral valve prolapse)    Neoplasm    of appendix   Rosacea    Past Surgical History:  Procedure Laterality Date   AUGMENTATION MAMMAPLASTY Bilateral 1990   saline   CARDIAC CATHETERIZATION  17years ago   small amount of blockage   CATRACTS Bilateral    COLONOSCOPY  2006 and oct 2018   ILEOCECETOMY N/A 08/17/2017   Procedure: OPEN ILEOCECETOMY;  Surgeon: Oralee Billow, MD;  Location: [facility];  Service: General;  Laterality: N/A;  [facility] PATHWAY   Patient Active Problem List   Diagnosis Date Noted   Degenerative disc disease, lumbar 02/15/2023   Osteoporosis 09/22/2021   Greater trochanteric bursitis of left hip 11/04/2020   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   Gastroesophageal reflux disease 06/29/2017   Hypercholesterolemia 06/29/2017   Hypertensive disorder 06/29/2017   Mitral valve disorder 06/29/2017   Tear of LCL (lateral collateral ligament) of knee, left, subsequent encounter 08/09/2016   Degenerative arthritis of left knee 07/06/2016   Degenerative arthritis of right knee 02/25/2016   Piriformis syndrome of right side 06/24/2015   Primary localized osteoarthrosis, lower leg 06/13/2014   Baker's cyst of knee 05/20/2014    PCP: Pcp,  No   REFERRING PROVIDER: Isidro Margo, DO  REFERRING DIAG: M25.561,M25.562,G89.29 (ICD-10-CM) - Chronic pain of both knees M71.22 (ICD-10-CM) - Synovial cyst of left knee M17.11 (ICD-10-CM) - Primary osteoarthritis of right knee M17.12 (ICD-10-CM) - Primary osteoarthritis of left knee  THERAPY DIAG:  Chronic pain of both knees - Plan: PT plan of care cert/re-cert  Primary osteoarthritis of both knees - Plan: PT plan of care cert/re-cert  Difficulty in walking, not elsewhere classified - Plan: PT plan of care cert/re-cert  Muscle weakness (generalized) - Plan: PT plan of care cert/re-cert  Cramp and spasm - Plan: PT plan of care cert/re-cert  Abnormal posture - Plan: PT plan of care cert/re-cert  Rationale for Evaluation and Treatment: Rehabilitation  ONSET DATE: 10/31/2023  SUBJECTIVE:   SUBJECTIVE STATEMENT: Patient reports she has been see Dr. Felipe Horton for about 5 years for her knees.  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 Felipe Horton does not feel like she needs TKA just yet.  She states she also has some medial joint line pain on the right but that her left knee is worse than her right.  She has frequent Baker's cysts on both knees and chronic pain.  She also reports fat pad irritation issues.  She has been having aspiration and cortisone injections about every 3 months to  control the symptoms.  However, she is fairly active and has very little pain for about 2 1/2 months then she will have some return of pain just before her next scheduled aspiration and injection.  She is retired and feels she is fairly active. She hopes to avoid TKA's in the near future and wants to gain her muscle back.    PERTINENT HISTORY: 10/31/23 both knees aspirated, Dr. Felipe Horton confirms end stage OA both knees.  PT for now.  F/U with Dr. Felipe Horton in 6-8 weeks PAIN:  Are you having pain? Yes: NPRS scale: 0 sitting still , currently no knee pain (when she has the cortisone and aspirations this  will last for approx 3 months) Pain location: bilateral knees Pain description: aching Aggravating factors: stairs, bending, stooping and squatting Relieving factors: rest, ice, aspiration and cortisone  PRECAUTIONS: None  RED FLAGS: None   WEIGHT BEARING RESTRICTIONS: No  FALLS:  Has patient fallen in last 6 months? No  LIVING ENVIRONMENT: Lives with: lives with their family Lives in: House/apartment Stairs: Yes: Internal: 12 steps; on right going up and External: 4 steps; on right going up Has following equipment at home:  none  (does not need any yet)  OCCUPATION: retired  PLOF: Independent, Independent with basic ADLs, Independent with household mobility without device, Independent with community mobility without device, Independent with homemaking with ambulation, Independent with gait, and Independent with transfers  PATIENT GOALS: to prolong need for TKA's  NEXT MD VISIT: 6-8 weeks from 10/31/23  OBJECTIVE:  Note: Objective measures were completed at Evaluation unless otherwise noted.  DIAGNOSTIC FINDINGS: xrays reveal end stage OA  PATIENT SURVEYS:  LEFS 64/80= 80%  COGNITION: Overall cognitive status: Within functional limits for tasks assessed     SENSATION: WFL  EDEMA:  Circumferential: mid patella (aspirated last on 10/31/23;  right:  34.3 cm    , Left:  35 cm     MUSCLE LENGTH: Hamstrings: Right 50 deg; Left 50 deg Thomas test: Right pos deg; Left pos deg  POSTURE: rounded shoulders and severe scoliosis   PALPATION: Severe crepitus bilateral PF and joint line  LOWER EXTREMITY ROM:  WFL  LOWER EXTREMITY MMT:  MMT Right eval Left eval  Hip flexion 3+ 3+  Hip extension 3+ 3+  Hip abduction 4- 4-  Hip adduction 4+ 4+  Hip internal rotation    Hip external rotation    Knee flexion 4- 4-  Knee extension 4- 4-  Ankle dorsiflexion    Ankle plantarflexion    Ankle inversion    Ankle eversion     (Blank rows = not tested)  LOWER EXTREMITY  SPECIAL TESTS:  Knee special tests: Patellafemoral grind test: positive , Step up/down test: positive , and Patella tap test (ballotable patella): negative  FUNCTIONAL TESTS:  5 times sit to stand: 14.94 sec Timed up and go (TUG): 10.98 sec  GAIT: Distance walked: 30 Assistive device utilized: None Level of assistance: Modified independence Comments: antalgic  TREATMENT DATE:  11/08/23  Initial eval completed and initiated HEP   PATIENT EDUCATION:  Education details: Initiated HEP Person educated: Patient Education method: Programmer, multimedia, Facilities manager, Verbal cues, and Handouts Education comprehension: verbalized understanding, returned demonstration, and verbal cues required  HOME EXERCISE PROGRAM: Access Code: [email or web address] URL: [email or web address] Date: 11/08/2023 Prepared by: Aletha Anderson  Exercises - Standing Hamstring Stretch on Chair  - 1 x daily - 7 x weekly - 1 sets - 3 reps - 30 sec hold - Standing Quad Stretch with Table and Chair Support  - 1 x daily - 7 x weekly - 1 sets - 3 reps - 30 sec hold - Supine ITB Stretch with Strap  - 1 x daily - 7 x weekly - 1 sets - 3 reps - 30 sec hold - Supine Quadricep Sets  - 1 x daily - 7 x weekly - 1 sets - 20 reps - Supine Knee Extension Strengthening  - 1 x daily - 7 x weekly - 1 sets - 20 reps - Small Range Straight Leg Raise  - 1 x daily - 7 x weekly - 2 sets - 10 reps - Seated Long Arc Quad  - 1 x daily - 7 x weekly - 1 sets - 20 reps - Side Stepping with Resistance at Ankles  - 1 x daily - 7 x weekly - 3 sets - 10 reps  ASSESSMENT:  CLINICAL IMPRESSION: Patient is a 83 y.o. female who was seen today for physical therapy evaluation and treatment for bilaterlal knee pain.  She presents with functional ROM, decreased strength, quad atrophy, palpable crepitus and elevated/chronic pain.   She has been doing aspiration and cortisone every 3 months successfully.  She is concerned, however, that she is losing strength and muscle.  She also has severe scoliosis and pelvic obliquity affecting her condition.  She would benefit from skilled PT for quad rehab, hip strengthening and stability training, and pain control.  OBJECTIVE IMPAIRMENTS: Abnormal gait, decreased balance, decreased mobility, difficulty walking, decreased ROM, decreased strength, increased edema, increased fascial restrictions, increased muscle spasms, impaired flexibility, postural dysfunction, and pain.   ACTIVITY LIMITATIONS: carrying, lifting, bending, sitting, standing, squatting, sleeping, stairs, transfers, bed mobility, toileting, and caring for others  PARTICIPATION LIMITATIONS: meal prep, cleaning, laundry, driving, shopping, community activity, yard work, and church  PERSONAL FACTORS: Age, Fitness, Time since onset of injury/illness/exacerbation, and 1-2 comorbidities: Htn and mitral valve prolapse  are also affecting patient's functional outcome.   REHAB POTENTIAL: Fair end stage OA  CLINICAL DECISION MAKING: Evolving/moderate complexity  EVALUATION COMPLEXITY: Moderate   GOALS: Goals reviewed with patient? Yes  SHORT TERM GOALS: Target date: 12/06/2023  Pain report to be no greater than 4/10  Baseline: Goal status: INITIAL  2.  Patient will be independent with initial HEP  Baseline:  Goal status: INITIAL   LONG TERM GOALS: Target date: 01/03/2024   Patient to report pain no greater than 2/10  Baseline:  Goal status: INITIAL  2.  Patient to be independent with advanced HEP  Baseline:  Goal status: INITIAL  3.  Patient to be able to ascend and descend steps with pain no greater than 2/10  Baseline:  Goal status: INITIAL  4.  Patient to be able to bend, stoop and squat with pain no greater than 2/10  Baseline:  Goal status: INITIAL  5.  Patient to be able to stand or walk for at  least 15 min without leg pain  Baseline:  Goal status: INITIAL  6.  Patient to report 85% improvement in overall symptoms  Baseline:  Goal status: INITIAL   PLAN:  PT FREQUENCY: 1-2x/week  PT DURATION: 8 weeks  PLANNED INTERVENTIONS: 97110-Therapeutic exercises, 97530- Therapeutic activity, [id number]- Neuromuscular re-education, 97535- Self Care, 16109- Manual therapy, [id number]- Gait training, [phone]- Aquatic Therapy, 97014- Electrical stimulation (unattended), [id number]- Electrical stimulation (manual), 97016- Vasopneumatic device, [id number]- Ultrasound, [id number]- Ionotophoresis 4mg /ml Dexamethasone , Patient/Family education, Balance training, Stair training, Taping, Dry Needling, Joint mobilization, Compression bandaging, Vestibular training, Visual/preceptual remediation/compensation, DME instructions, Cryotherapy, and Moist heat  PLAN FOR NEXT SESSION: Review HEP, Nustep, quad rehab and hip strengthening.   Bridgette Campus B. Gabriela Irigoyen, PT 11/08/23 3:50 PM [facility] [address] [place], [place] [address] Phone # [phone] Fax [phone]

## 2023-11-14 ENCOUNTER — Ambulatory Visit: Payer: Medicare Other

## 2023-11-14 DIAGNOSIS — M6281 Muscle weakness (generalized): Secondary | ICD-10-CM | POA: Diagnosis not present

## 2023-11-14 DIAGNOSIS — M25561 Pain in right knee: Secondary | ICD-10-CM | POA: Diagnosis not present

## 2023-11-14 DIAGNOSIS — M25562 Pain in left knee: Secondary | ICD-10-CM | POA: Diagnosis not present

## 2023-11-14 DIAGNOSIS — M170 Bilateral primary osteoarthritis of knee: Secondary | ICD-10-CM | POA: Diagnosis not present

## 2023-11-14 DIAGNOSIS — R262 Difficulty in walking, not elsewhere classified: Secondary | ICD-10-CM

## 2023-11-14 DIAGNOSIS — R252 Cramp and spasm: Secondary | ICD-10-CM

## 2023-11-14 DIAGNOSIS — R293 Abnormal posture: Secondary | ICD-10-CM

## 2023-11-14 DIAGNOSIS — G8929 Other chronic pain: Secondary | ICD-10-CM

## 2023-11-14 NOTE — Therapy (Signed)
OUTPATIENT PHYSICAL THERAPY LOWER EXTREMITY TREATMENT   Patient Name: Abigail [name] MRN: [id number] DOB:18-Jan-1941, 83 y.o., female Today's Date: 11/14/2023  END OF SESSION:  PT End of Session - 11/14/23 1457     Visit Number 2    Date for PT Re-Evaluation 01/03/24    Progress Note Due on Visit 10    PT Start Time 1448    PT Stop Time 1534    PT Time Calculation (min) 46 min    Activity Tolerance Patient tolerated treatment well    Behavior During Therapy WFL for tasks assessed/performed             Past Medical History:  Diagnosis Date   Arthritis    oa   Baker's cyst    behind left knee   Constipation    Hemorrhoids    Hypertension    MVP (mitral valve prolapse)    Neoplasm    of appendix   Rosacea    Past Surgical History:  Procedure Laterality Date   AUGMENTATION MAMMAPLASTY Bilateral 1990   saline   CARDIAC CATHETERIZATION  17years ago   small amount of blockage   CATRACTS Bilateral    COLONOSCOPY  2006 and oct 2018   ILEOCECETOMY N/A 08/17/2017   Procedure: OPEN ILEOCECETOMY;  Surgeon: Darnell Level, MD;  Location: [facility];  Service: General;  Laterality: N/A;  ERAS PATHWAY   Patient Active Problem List   Diagnosis Date Noted   Degenerative disc disease, lumbar 02/15/2023   Osteoporosis 09/22/2021   Greater trochanteric bursitis of left hip 11/04/2020   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   Gastroesophageal reflux disease 06/29/2017   Hypercholesterolemia 06/29/2017   Hypertensive disorder 06/29/2017   Mitral valve disorder 06/29/2017   Tear of LCL (lateral collateral ligament) of knee, left, subsequent encounter 08/09/2016   Degenerative arthritis of left knee 07/06/2016   Degenerative arthritis of right knee 02/25/2016   Piriformis syndrome of right side 06/24/2015   Primary localized osteoarthrosis, lower leg 06/13/2014   Baker's cyst of knee 05/20/2014    PCP: Pcp, No   REFERRING PROVIDER: Judi Saa,  DO  REFERRING DIAG: M25.561,M25.562,G89.29 (ICD-10-CM) - Chronic pain of both knees M71.22 (ICD-10-CM) - Synovial cyst of left knee M17.11 (ICD-10-CM) - Primary osteoarthritis of right knee M17.12 (ICD-10-CM) - Primary osteoarthritis of left knee  THERAPY DIAG:  Chronic pain of both knees  Difficulty in walking, not elsewhere classified  Muscle weakness (generalized)  Cramp and spasm  Abnormal posture  Primary osteoarthritis of both knees  Rationale for Evaluation and Treatment: Rehabilitation  ONSET DATE: 10/31/2023  SUBJECTIVE:   SUBJECTIVE STATEMENT: Patient states she is doing ok.  Focused on the 3 stretches we assigned and had some minor difficulty with standing on one leg (left) during the standing stretches.  Otherwise, doing ok.     From initial eval: Patient reports she has been see [name] for about 5 years for her knees.  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 does not feel like she needs TKA just yet.  She states she also has some medial joint line pain on the right but that her left knee is worse than her right.  She has frequent Baker's cysts on both knees and chronic pain.  She also reports fat pad irritation issues.  She has been having aspiration and cortisone injections about every 3 months to control the symptoms.  However, she is fairly active and  has very little pain for about 2 1/2 months then she will have some return of pain just before her next scheduled aspiration and injection.  She is retired and feels she is fairly active. She hopes to avoid TKA's in the near future and wants to gain her muscle back.    PERTINENT HISTORY: 10/31/23 both knees aspirated, [name] confirms end stage OA both knees.  PT for now.  F/U with [name] in 6-8 weeks PAIN:  Are you having pain? Yes: NPRS scale: 0 sitting still , currently no knee pain (when she has the cortisone and aspirations this will last for approx 3 months) Pain location:  bilateral knees Pain description: aching Aggravating factors: stairs, bending, stooping and squatting Relieving factors: rest, ice, aspiration and cortisone  PRECAUTIONS: None  RED FLAGS: None   WEIGHT BEARING RESTRICTIONS: No  FALLS:  Has patient fallen in last 6 months? No  LIVING ENVIRONMENT: Lives with: lives with their family Lives in: House/apartment Stairs: Yes: Internal: 12 steps; on right going up and External: 4 steps; on right going up Has following equipment at home:  none  (does not need any yet)  OCCUPATION: retired  PLOF: Independent, Independent with basic ADLs, Independent with household mobility without device, Independent with community mobility without device, Independent with homemaking with ambulation, Independent with gait, and Independent with transfers  PATIENT GOALS: to prolong need for TKA's  NEXT MD VISIT: 6-8 weeks from 10/31/23  OBJECTIVE:  Note: Objective measures were completed at Evaluation unless otherwise noted.  DIAGNOSTIC FINDINGS: xrays reveal end stage OA  PATIENT SURVEYS:  LEFS 64/80= 80%  COGNITION: Overall cognitive status: Within functional limits for tasks assessed     SENSATION: WFL  EDEMA:  Circumferential: mid patella (aspirated last on 10/31/23;  right:  34.3 cm    , Left:  35 cm     MUSCLE LENGTH: Hamstrings: Right 50 deg; Left 50 deg Thomas test: Right pos deg; Left pos deg  POSTURE: rounded shoulders and severe scoliosis   PALPATION: Severe crepitus bilateral PF and joint line  LOWER EXTREMITY ROM:  WFL  LOWER EXTREMITY MMT:  MMT Right eval Left eval  Hip flexion 3+ 3+  Hip extension 3+ 3+  Hip abduction 4- 4-  Hip adduction 4+ 4+  Hip internal rotation    Hip external rotation    Knee flexion 4- 4-  Knee extension 4- 4-  Ankle dorsiflexion    Ankle plantarflexion    Ankle inversion    Ankle eversion     (Blank rows = not tested)  LOWER EXTREMITY SPECIAL TESTS:  Knee special tests:  Patellafemoral grind test: positive , Step up/down test: positive , and Patella tap test (ballotable patella): negative  FUNCTIONAL TESTS:  5 times sit to stand: 14.94 sec Timed up and go (TUG): 10.98 sec  GAIT: Distance walked: 30 Assistive device utilized: None Level of assistance: Modified independence Comments: antalgic  TREATMENT DATE: 11/14/23  Nustep x 5 min level 2 Seated clam with yellow loop x 20 Standing lateral band walks with blue loop x 2 laps of 10 steps each Seated LAQ with 1 1/2 lbs x 20 Seated March x 20 with 1 1/2 lbs x 20 Seated hip ER 2 x 10 with 1 1/2 lbs  Supine quad set x 20 bilaterally Supine TKE with blue foam roller 2 x 10 with 1 1/2 lbs  Supine SAQ with larger blue bolster 2 x 10 with 1 1/2 lbs Supine SLR with 1 1/2 lbs 2 x 10    11/08/23  Initial eval completed and initiated HEP   PATIENT EDUCATION:  Education details: Initiated HEP Person educated: Patient Education method: Programmer, multimedia, Facilities manager, Verbal cues, and Handouts Education comprehension: verbalized understanding, returned demonstration, and verbal cues required  HOME EXERCISE PROGRAM: Access Code: [email or web address] URL: [email or web address] Date: 11/08/2023 Prepared by: Mikey Kirschner  Exercises - Standing Hamstring Stretch on Chair  - 1 x daily - 7 x weekly - 1 sets - 3 reps - 30 sec hold - Standing Quad Stretch with Table and Chair Support  - 1 x daily - 7 x weekly - 1 sets - 3 reps - 30 sec hold - Supine ITB Stretch with Strap  - 1 x daily - 7 x weekly - 1 sets - 3 reps - 30 sec hold - Supine Quadricep Sets  - 1 x daily - 7 x weekly - 1 sets - 20 reps - Supine Knee Extension Strengthening  - 1 x daily - 7 x weekly - 1 sets - 20 reps - Small Range Straight Leg Raise  - 1 x daily - 7 x weekly - 2 sets - 10 reps - Seated Long Arc Quad  - 1 x  daily - 7 x weekly - 1 sets - 20 reps - Side Stepping with Resistance at Ankles  - 1 x daily - 7 x weekly - 3 sets - 10 reps  ASSESSMENT:  CLINICAL IMPRESSION: Patient did well today and was able to tolerate several more advanced LE exercises.  She seems compliant with her HEP as she was able to recall these easily.  We added quad and hip strengthening exercises today and several of these were added to her HEP.  She denied any pain at end of session.  She should continue to do well.    She would benefit from continued skilled PT for quad rehab, hip strengthening and stability training, and pain control.  OBJECTIVE IMPAIRMENTS: Abnormal gait, decreased balance, decreased mobility, difficulty walking, decreased ROM, decreased strength, increased edema, increased fascial restrictions, increased muscle spasms, impaired flexibility, postural dysfunction, and pain.   ACTIVITY LIMITATIONS: carrying, lifting, bending, sitting, standing, squatting, sleeping, stairs, transfers, bed mobility, toileting, and caring for others  PARTICIPATION LIMITATIONS: meal prep, cleaning, laundry, driving, shopping, community activity, yard work, and church  PERSONAL FACTORS: Age, Fitness, Time since onset of injury/illness/exacerbation, and 1-2 comorbidities: Htn and mitral valve prolapse  are also affecting patient's functional outcome.   REHAB POTENTIAL: Fair end stage OA  CLINICAL DECISION MAKING: Evolving/moderate complexity  EVALUATION COMPLEXITY: Moderate   GOALS: Goals reviewed with patient? Yes  SHORT TERM GOALS: Target date: 12/06/2023  Pain report to be no greater than 4/10  Baseline: Goal status: INITIAL  2.  Patient will be independent with initial HEP  Baseline:  Goal status: INITIAL   LONG TERM GOALS: Target date: 01/03/2024   Patient to report pain no greater than 2/10  Baseline:  Goal status: INITIAL  2.  Patient to be independent with advanced HEP  Baseline:  Goal status:  INITIAL  3.  Patient to be able to ascend and descend steps with pain no greater than 2/10  Baseline:  Goal status: INITIAL  4.  Patient to be able to bend, stoop and squat with pain no greater than 2/10  Baseline:  Goal status: INITIAL  5.  Patient to be able to stand or walk for at least 15 min without leg pain  Baseline:  Goal status: INITIAL  6.  Patient to report 85% improvement in overall symptoms  Baseline:  Goal status: INITIAL   PLAN:  PT FREQUENCY: 1-2x/week  PT DURATION: 8 weeks  PLANNED INTERVENTIONS: 97110-Therapeutic exercises, 97530- Therapeutic activity, [id number]- Neuromuscular re-education, 97535- Self Care, 69629- Manual therapy, [id number]- Gait training, [phone]- Aquatic Therapy, 97014- Electrical stimulation (unattended), [id number]- Electrical stimulation (manual), 97016- Vasopneumatic device, [id number]- Ultrasound, [id number]- Ionotophoresis 4mg /ml Dexamethasone, Patient/Family education, Balance training, Stair training, Taping, Dry Needling, Joint mobilization, Compression bandaging, Vestibular training, Visual/preceptual remediation/compensation, DME instructions, Cryotherapy, and Moist heat  PLAN FOR NEXT SESSION: Progress HEP, Nustep, quad rehab and hip strengthening.   Victorino Dike B. Amilio Zehnder, PT 11/14/23 5:26 PM [facility] [address] [place], [place] [address] Phone # [phone] Fax [phone]

## 2023-11-15 NOTE — Therapy (Signed)
OUTPATIENT PHYSICAL THERAPY LOWER EXTREMITY TREATMENT   Patient Name: Abigail [name] MRN: [id number] DOB:03-20-1941, 83 y.o., female Today's Date: 11/16/2023  END OF SESSION:  PT End of Session - 11/16/23 1447     Visit Number 3    Date for PT Re-Evaluation 01/03/24    Authorization Type [employer]    Progress Note Due on Visit 10    PT Start Time 1445    PT Stop Time 1528    PT Time Calculation (min) 43 min    Activity Tolerance Patient tolerated treatment well    Behavior During Therapy WFL for tasks assessed/performed              Past Medical History:  Diagnosis Date   Arthritis    oa   Baker's cyst    behind left knee   Constipation    Hemorrhoids    Hypertension    MVP (mitral valve prolapse)    Neoplasm    of appendix   Rosacea    Past Surgical History:  Procedure Laterality Date   AUGMENTATION MAMMAPLASTY Bilateral 1990   saline   CARDIAC CATHETERIZATION  17years ago   small amount of blockage   CATRACTS Bilateral    COLONOSCOPY  2006 and oct 2018   ILEOCECETOMY N/A 08/17/2017   Procedure: OPEN ILEOCECETOMY;  Surgeon: Darnell Level, MD;  Location: [facility];  Service: General;  Laterality: N/A;  ERAS PATHWAY   Patient Active Problem List   Diagnosis Date Noted   Degenerative disc disease, lumbar 02/15/2023   Osteoporosis 09/22/2021   Greater trochanteric bursitis of left hip 11/04/2020   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   Gastroesophageal reflux disease 06/29/2017   Hypercholesterolemia 06/29/2017   Hypertensive disorder 06/29/2017   Mitral valve disorder 06/29/2017   Tear of LCL (lateral collateral ligament) of knee, left, subsequent encounter 08/09/2016   Degenerative arthritis of left knee 07/06/2016   Degenerative arthritis of right knee 02/25/2016   Piriformis syndrome of right side 06/24/2015   Primary localized osteoarthrosis, lower leg 06/13/2014   Baker's cyst of knee 05/20/2014    PCP: Pcp,  No   REFERRING PROVIDER: Judi Saa, DO  REFERRING DIAG: M25.561,M25.562,G89.29 (ICD-10-CM) - Chronic pain of both knees M71.22 (ICD-10-CM) - Synovial cyst of left knee M17.11 (ICD-10-CM) - Primary osteoarthritis of right knee M17.12 (ICD-10-CM) - Primary osteoarthritis of left knee  THERAPY DIAG:  Chronic pain of both knees  Difficulty in walking, not elsewhere classified  Muscle weakness (generalized)  Cramp and spasm  Abnormal posture  Primary osteoarthritis of both knees  Rationale for Evaluation and Treatment: Rehabilitation  ONSET DATE: 10/31/2023  SUBJECTIVE:   SUBJECTIVE STATEMENT: No new complaints today.     From initial eval: Patient reports she has been see [name] for about 5 years for her knees.  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 does not feel like she needs TKA just yet.  She states she also has some medial joint line pain on the right but that her left knee is worse than her right.  She has frequent Baker's cysts on both knees and chronic pain.  She also reports fat pad irritation issues.  She has been having aspiration and cortisone injections about every 3 months to control the symptoms.  However, she is fairly active and has very little pain for about 2 1/2 months then she will have some return of pain just before her  next scheduled aspiration and injection.  She is retired and feels she is fairly active. She hopes to avoid TKA's in the near future and wants to gain her muscle back.    PERTINENT HISTORY: 10/31/23 both knees aspirated, [name] confirms end stage OA both knees.  PT for now.  F/U with [name] in 6-8 weeks PAIN:  Are you having pain? Yes: NPRS scale: 0 sitting still , currently no knee pain (when she has the cortisone and aspirations this will last for approx 3 months) Pain location: bilateral knees Pain description: aching Aggravating factors: stairs, bending, stooping and squatting Relieving factors:  rest, ice, aspiration and cortisone  PRECAUTIONS: None  RED FLAGS: None   WEIGHT BEARING RESTRICTIONS: No  FALLS:  Has patient fallen in last 6 months? No  LIVING ENVIRONMENT: Lives with: lives with their family Lives in: House/apartment Stairs: Yes: Internal: 12 steps; on right going up and External: 4 steps; on right going up Has following equipment at home:  none  (does not need any yet)  OCCUPATION: retired  PLOF: Independent, Independent with basic ADLs, Independent with household mobility without device, Independent with community mobility without device, Independent with homemaking with ambulation, Independent with gait, and Independent with transfers  PATIENT GOALS: to prolong need for TKA's  NEXT MD VISIT: 6-8 weeks from 10/31/23  OBJECTIVE:  Note: Objective measures were completed at Evaluation unless otherwise noted.  DIAGNOSTIC FINDINGS: xrays reveal end stage OA  PATIENT SURVEYS:  LEFS 64/80= 80%  COGNITION: Overall cognitive status: Within functional limits for tasks assessed     SENSATION: WFL  EDEMA:  Circumferential: mid patella (aspirated last on 10/31/23;  right:  34.3 cm    , Left:  35 cm     MUSCLE LENGTH: Hamstrings: Right 50 deg; Left 50 deg Thomas test: Right pos deg; Left pos deg  POSTURE: rounded shoulders and severe scoliosis   PALPATION: Severe crepitus bilateral PF and joint line  LOWER EXTREMITY ROM:  WFL  LOWER EXTREMITY MMT:  MMT Right eval Left eval  Hip flexion 3+ 3+  Hip extension 3+ 3+  Hip abduction 4- 4-  Hip adduction 4+ 4+  Hip internal rotation    Hip external rotation    Knee flexion 4- 4-  Knee extension 4- 4-  Ankle dorsiflexion    Ankle plantarflexion    Ankle inversion    Ankle eversion     (Blank rows = not tested)  LOWER EXTREMITY SPECIAL TESTS:  Knee special tests: Patellafemoral grind test: positive , Step up/down test: positive , and Patella tap test (ballotable patella): negative  FUNCTIONAL  TESTS:  5 times sit to stand: 14.94 sec Timed up and go (TUG): 10.98 sec  GAIT: Distance walked: 30 Assistive device utilized: None Level of assistance: Modified independence Comments: antalgic  TREATMENT DATE: 11/16/23  Nustep x 5 min level 5 Seated clam with yellow loop x 20 Standing lateral band walks with blue loop x 8 laps of 6-7 steps each Seated LAQ with 2 lbs x 20 cues for slow pace Seated March x 20 with 2 lbs x 20 Seated hip ER 2 x 10 with 2 lbs  Supine TKE with blue foam roller 2 x 10 with 1 1/2 lbs  Supine SAQ with blue foam roller  2 x 10 with 2 lbs Supine SLR with 2 lbs 1 x 10 Prone HS curl 2# x 10 S/L hip ABD with legs at 45 degrees angle forward x 15 B S/L clam green band x 20 B Bridge x 20   11/14/23  Nustep x 5 min level 2 Seated clam with yellow loop x 20 Standing lateral band walks with blue loop x 2 laps of 10 steps each Seated LAQ with 1 1/2 lbs x 20 Seated March x 20 with 1 1/2 lbs x 20 Seated hip ER 2 x 10 with 1 1/2 lbs  Supine quad set x 20 bilaterally Supine TKE with blue foam roller 2 x 10 with 1 1/2 lbs  Supine SAQ with larger blue bolster 2 x 10 with 1 1/2 lbs Supine SLR with 1 1/2 lbs 2 x 10    11/08/23  Initial eval completed and initiated HEP   PATIENT EDUCATION:  Education details: Initiated HEP Person educated: Patient Education method: Programmer, multimedia, Facilities manager, Verbal cues, and Handouts Education comprehension: verbalized understanding, returned demonstration, and verbal cues required  HOME EXERCISE PROGRAM: Access Code: [email or web address] URL: [email or web address] Date: 11/08/2023 Prepared by: Mikey Kirschner  Exercises - Standing Hamstring Stretch on Chair  - 1 x daily - 7 x weekly - 1 sets - 3 reps - 30 sec hold - Standing Quad Stretch with Table and Chair Support  - 1 x daily - 7 x weekly -  1 sets - 3 reps - 30 sec hold - Supine ITB Stretch with Strap  - 1 x daily - 7 x weekly - 1 sets - 3 reps - 30 sec hold - Supine Quadricep Sets  - 1 x daily - 7 x weekly - 1 sets - 20 reps - Supine Knee Extension Strengthening  - 1 x daily - 7 x weekly - 1 sets - 20 reps - Small Range Straight Leg Raise  - 1 x daily - 7 x weekly - 2 sets - 10 reps - Seated Long Arc Quad  - 1 x daily - 7 x weekly - 1 sets - 20 reps - Side Stepping with Resistance at Ankles  - 1 x daily - 7 x weekly - 3 sets - 10 reps  ASSESSMENT:  CLINICAL IMPRESSION: Brandey did well and was able to progress with increased weight and resistance. Some verbal cueing needed for speed and reps. Increased knee pain with standing hip ABD so moved to sidelying.   OBJECTIVE IMPAIRMENTS: Abnormal gait, decreased balance, decreased mobility, difficulty walking, decreased ROM, decreased strength, increased edema, increased fascial restrictions, increased muscle spasms, impaired flexibility, postural dysfunction, and pain.   ACTIVITY LIMITATIONS: carrying, lifting, bending, sitting, standing, squatting, sleeping, stairs, transfers, bed mobility, toileting, and caring for others  PARTICIPATION LIMITATIONS: meal prep, cleaning, laundry, driving, shopping, community activity, yard work, and church  PERSONAL FACTORS: Age, Fitness, Time since onset of injury/illness/exacerbation, and 1-2 comorbidities: Htn and mitral valve prolapse  are also affecting patient's functional outcome.   REHAB POTENTIAL: Fair end stage  OA  CLINICAL DECISION MAKING: Evolving/moderate complexity  EVALUATION COMPLEXITY: Moderate   GOALS: Goals reviewed with patient? Yes  SHORT TERM GOALS: Target date: 12/06/2023  Pain report to be no greater than 4/10  Baseline: Goal status: INITIAL  2.  Patient will be independent with initial HEP  Baseline:  Goal status: INITIAL   LONG TERM GOALS: Target date: 01/03/2024   Patient to report pain no greater than 2/10   Baseline:  Goal status: INITIAL  2.  Patient to be independent with advanced HEP  Baseline:  Goal status: INITIAL  3.  Patient to be able to ascend and descend steps with pain no greater than 2/10  Baseline:  Goal status: INITIAL  4.  Patient to be able to bend, stoop and squat with pain no greater than 2/10  Baseline:  Goal status: INITIAL  5.  Patient to be able to stand or walk for at least 15 min without leg pain  Baseline:  Goal status: INITIAL  6.  Patient to report 85% improvement in overall symptoms  Baseline:  Goal status: INITIAL   PLAN:  PT FREQUENCY: 1-2x/week  PT DURATION: 8 weeks  PLANNED INTERVENTIONS: 97110-Therapeutic exercises, 97530- Therapeutic activity, [id number]- Neuromuscular re-education, 97535- Self Care, 60454- Manual therapy, [id number]- Gait training, [phone]- Aquatic Therapy, 97014- Electrical stimulation (unattended), [id number]- Electrical stimulation (manual), 97016- Vasopneumatic device, [id number]- Ultrasound, [id number]- Ionotophoresis 4mg /ml Dexamethasone, Patient/Family education, Balance training, Stair training, Taping, Dry Needling, Joint mobilization, Compression bandaging, Vestibular training, Visual/preceptual remediation/compensation, DME instructions, Cryotherapy, and Moist heat  PLAN FOR NEXT SESSION: Progress HEP, Nustep, quad rehab and hip strengthening.   Solon Palm, PT 11/16/23 3:33 PM [facility] [address], [address] [place], [place] [address] Phone # [phone] Fax [phone]

## 2023-11-16 ENCOUNTER — Encounter: Payer: Self-pay | Admitting: Physical Therapy

## 2023-11-16 ENCOUNTER — Ambulatory Visit: Payer: Medicare Other | Admitting: Physical Therapy

## 2023-11-16 DIAGNOSIS — R293 Abnormal posture: Secondary | ICD-10-CM

## 2023-11-16 DIAGNOSIS — M25562 Pain in left knee: Secondary | ICD-10-CM | POA: Diagnosis not present

## 2023-11-16 DIAGNOSIS — M6281 Muscle weakness (generalized): Secondary | ICD-10-CM

## 2023-11-16 DIAGNOSIS — R262 Difficulty in walking, not elsewhere classified: Secondary | ICD-10-CM

## 2023-11-16 DIAGNOSIS — M25561 Pain in right knee: Secondary | ICD-10-CM | POA: Diagnosis not present

## 2023-11-16 DIAGNOSIS — G8929 Other chronic pain: Secondary | ICD-10-CM | POA: Diagnosis not present

## 2023-11-16 DIAGNOSIS — M170 Bilateral primary osteoarthritis of knee: Secondary | ICD-10-CM | POA: Diagnosis not present

## 2023-11-16 DIAGNOSIS — R252 Cramp and spasm: Secondary | ICD-10-CM

## 2023-11-19 NOTE — Therapy (Signed)
OUTPATIENT PHYSICAL THERAPY LOWER EXTREMITY TREATMENT   Patient Name: Abigail [name] MRN: [id number] DOB:10-21-1941, 83 y.o., female Today's Date: 11/20/2023  END OF SESSION:  PT End of Session - 11/20/23 1437     Visit Number 4    Date for PT Re-Evaluation 01/03/24    Authorization Type [employer]    Progress Note Due on Visit 10    PT Start Time 1445    PT Stop Time 1530    PT Time Calculation (min) 45 min    Activity Tolerance Patient tolerated treatment well    Behavior During Therapy WFL for tasks assessed/performed               Past Medical History:  Diagnosis Date   Arthritis    oa   Baker's cyst    behind left knee   Constipation    Hemorrhoids    Hypertension    MVP (mitral valve prolapse)    Neoplasm    of appendix   Rosacea    Past Surgical History:  Procedure Laterality Date   AUGMENTATION MAMMAPLASTY Bilateral 1990   saline   CARDIAC CATHETERIZATION  17years ago   small amount of blockage   CATRACTS Bilateral    COLONOSCOPY  2006 and oct 2018   ILEOCECETOMY N/A 08/17/2017   Procedure: OPEN ILEOCECETOMY;  Surgeon: Darnell Level, MD;  Location: [facility];  Service: General;  Laterality: N/A;  ERAS PATHWAY   Patient Active Problem List   Diagnosis Date Noted   Degenerative disc disease, lumbar 02/15/2023   Osteoporosis 09/22/2021   Greater trochanteric bursitis of left hip 11/04/2020   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   Gastroesophageal reflux disease 06/29/2017   Hypercholesterolemia 06/29/2017   Hypertensive disorder 06/29/2017   Mitral valve disorder 06/29/2017   Tear of LCL (lateral collateral ligament) of knee, left, subsequent encounter 08/09/2016   Degenerative arthritis of left knee 07/06/2016   Degenerative arthritis of right knee 02/25/2016   Piriformis syndrome of right side 06/24/2015   Primary localized osteoarthrosis, lower leg 06/13/2014   Baker's cyst of knee 05/20/2014    PCP: Pcp,  No   REFERRING PROVIDER: Judi Saa, DO  REFERRING DIAG: M25.561,M25.562,G89.29 (ICD-10-CM) - Chronic pain of both knees M71.22 (ICD-10-CM) - Synovial cyst of left knee M17.11 (ICD-10-CM) - Primary osteoarthritis of right knee M17.12 (ICD-10-CM) - Primary osteoarthritis of left knee  THERAPY DIAG:  Chronic pain of both knees  Difficulty in walking, not elsewhere classified  Muscle weakness (generalized)  Cramp and spasm  Rationale for Evaluation and Treatment: Rehabilitation  ONSET DATE: 10/31/2023  SUBJECTIVE:   SUBJECTIVE STATEMENT: Doing pretty well. Did my exercises at home yesterday and I'm feeling it.     From initial eval: Patient reports she has been see [name] for about 5 years for her knees.  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 does not feel like she needs TKA just yet.  She states she also has some medial joint line pain on the right but that her left knee is worse than her right.  She has frequent Baker's cysts on both knees and chronic pain.  She also reports fat pad irritation issues.  She has been having aspiration and cortisone injections about every 3 months to control the symptoms.  However, she is fairly active and has very little pain for about 2 1/2 months then she will have some return of pain just before  her next scheduled aspiration and injection.  She is retired and feels she is fairly active. She hopes to avoid TKA's in the near future and wants to gain her muscle back.    PERTINENT HISTORY: 10/31/23 both knees aspirated, [name] confirms end stage OA both knees.  PT for now.  F/U with [name] in 6-8 weeks PAIN:  Are you having pain? Yes: NPRS scale: 0 sitting still , currently no knee pain (when she has the cortisone and aspirations this will last for approx 3 months) Pain location: bilateral knees Pain description: aching Aggravating factors: stairs, bending, stooping and squatting Relieving factors: rest, ice,  aspiration and cortisone  PRECAUTIONS: None  RED FLAGS: None   WEIGHT BEARING RESTRICTIONS: No  FALLS:  Has patient fallen in last 6 months? No  LIVING ENVIRONMENT: Lives with: lives with their family Lives in: House/apartment Stairs: Yes: Internal: 12 steps; on right going up and External: 4 steps; on right going up Has following equipment at home:  none  (does not need any yet)  OCCUPATION: retired  PLOF: Independent, Independent with basic ADLs, Independent with household mobility without device, Independent with community mobility without device, Independent with homemaking with ambulation, Independent with gait, and Independent with transfers  PATIENT GOALS: to prolong need for TKA's  NEXT MD VISIT: 6-8 weeks from 10/31/23  OBJECTIVE:  Note: Objective measures were completed at Evaluation unless otherwise noted.  DIAGNOSTIC FINDINGS: xrays reveal end stage OA  PATIENT SURVEYS:  LEFS 64/80= 80%  COGNITION: Overall cognitive status: Within functional limits for tasks assessed     SENSATION: WFL  EDEMA:  Circumferential: mid patella (aspirated last on 10/31/23;  right:  34.3 cm    , Left:  35 cm     MUSCLE LENGTH: Hamstrings: Right 50 deg; Left 50 deg Thomas test: Right pos deg; Left pos deg  POSTURE: rounded shoulders and severe scoliosis   PALPATION: Severe crepitus bilateral PF and joint line  LOWER EXTREMITY ROM:  WFL  LOWER EXTREMITY MMT:  MMT Right eval Left eval  Hip flexion 3+ 3+  Hip extension 3+ 3+  Hip abduction 4- 4-  Hip adduction 4+ 4+  Hip internal rotation    Hip external rotation    Knee flexion 4- 4-  Knee extension 4- 4-  Ankle dorsiflexion    Ankle plantarflexion    Ankle inversion    Ankle eversion     (Blank rows = not tested)  LOWER EXTREMITY SPECIAL TESTS:  Knee special tests: Patellafemoral grind test: positive , Step up/down test: positive , and Patella tap test (ballotable patella): negative  FUNCTIONAL TESTS:  5  times sit to stand: 14.94 sec Timed up and go (TUG): 10.98 sec  GAIT: Distance walked: 30 Assistive device utilized: None Level of assistance: Modified independence Comments: antalgic  TREATMENT DATE: 11/20/23  Nustep x 5 min level 5 Seated clam with yellow loop x 20 Standing lateral band walks with red loop in fig 8 around ankles x 8 laps of 6-7 steps each Seated LAQ with 2 lbs x 20 cues for slow pace Seated March x 20 with 2 lbs Seated hip ER 2 x 10 with 2 lbs  Prone HS curl with knees off EOB 2# 2 x 10 Supine SAQ with blue foam roller  2 x 10 with 2 lbs Supine SLR with 2 lbs 2 x 10 Decline squat rockerboard x 10 cues to sit bottom bwd Step downs 4" x 10 B - no pain Step down 2x each on 6 inch step no pain but needed cues for eccentric control.   11/16/23  Nustep x 5 min level 5 Seated clam with yellow loop x 20 Standing lateral band walks with blue loop x 8 laps of 6-7 steps each Seated LAQ with 2 lbs x 20 cues for slow pace Seated March x 20 with 2 lbs x 20 Seated hip ER 2 x 10 with 2 lbs  Supine TKE with blue foam roller 2 x 10 with 1 1/2 lbs  Supine SAQ with blue foam roller  2 x 10 with 2 lbs Supine SLR with 2 lbs 1 x 10 Prone HS curl 2# x 10 S/L hip ABD with legs at 45 degrees angle forward x 15 B S/L clam green band x 20 B Bridge x 20   11/14/23  Nustep x 5 min level 2 Seated clam with yellow loop x 20 Standing lateral band walks with blue loop x 2 laps of 10 steps each Seated LAQ with 1 1/2 lbs x 20 Seated March x 20 with 1 1/2 lbs x 20 Seated hip ER 2 x 10 with 1 1/2 lbs  Supine quad set x 20 bilaterally Supine TKE with blue foam roller 2 x 10 with 1 1/2 lbs  Supine SAQ with larger blue bolster 2 x 10 with 1 1/2 lbs Supine SLR with 1 1/2 lbs 2 x 10    11/08/23  Initial eval completed and initiated HEP   PATIENT EDUCATION:   Education details: Initiated HEP Person educated: Patient Education method: Programmer, multimedia, Facilities manager, Verbal cues, and Handouts Education comprehension: verbalized understanding, returned demonstration, and verbal cues required  HOME EXERCISE PROGRAM: Access Code: [email or web address] URL: [email or web address] Date: 11/08/2023 Prepared by: Mikey Kirschner  Exercises - Standing Hamstring Stretch on Chair  - 1 x daily - 7 x weekly - 1 sets - 3 reps - 30 sec hold - Standing Quad Stretch with Table and Chair Support  - 1 x daily - 7 x weekly - 1 sets - 3 reps - 30 sec hold - Supine ITB Stretch with Strap  - 1 x daily - 7 x weekly - 1 sets - 3 reps - 30 sec hold - Supine Quadricep Sets  - 1 x daily - 7 x weekly - 1 sets - 20 reps - Supine Knee Extension Strengthening  - 1 x daily - 7 x weekly - 1 sets - 20 reps - Small Range Straight Leg Raise  - 1 x daily - 7 x weekly - 2 sets - 10 reps - Seated Long Arc Quad  - 1 x daily - 7 x weekly - 1 sets - 20 reps - Side Stepping with Resistance at Ankles  - 1 x daily - 7 x weekly - 3 sets - 10 reps  ASSESSMENT:  CLINICAL IMPRESSION: Improved tolerance to strengthening today. Prone knee flexion is challenging and needs to be done with knees off EOB for comfort. Her pain is mainly with stairs, so we focused the end of session on eccentric strengthening which she tolerated well, but demonstrated weakness in the last inch or so.   OBJECTIVE IMPAIRMENTS: Abnormal gait, decreased balance, decreased mobility, difficulty walking, decreased ROM, decreased strength, increased edema, increased fascial restrictions, increased muscle spasms, impaired flexibility, postural dysfunction, and pain.   ACTIVITY LIMITATIONS: carrying, lifting, bending, sitting, standing, squatting, sleeping, stairs, transfers, bed mobility, toileting, and caring for others  PARTICIPATION LIMITATIONS: meal prep, cleaning, laundry, driving, shopping, community activity, yard work,  and church  PERSONAL FACTORS: Age, Fitness, Time since onset of injury/illness/exacerbation, and 1-2 comorbidities: Htn and mitral valve prolapse  are also affecting patient's functional outcome.   REHAB POTENTIAL: Fair end stage OA  CLINICAL DECISION MAKING: Evolving/moderate complexity  EVALUATION COMPLEXITY: Moderate   GOALS: Goals reviewed with patient? Yes  SHORT TERM GOALS: Target date: 12/06/2023  Pain report to be no greater than 4/10  Baseline: Goal status: INITIAL  2.  Patient will be independent with initial HEP  Baseline:  Goal status: INITIAL   LONG TERM GOALS: Target date: 01/03/2024   Patient to report pain no greater than 2/10  Baseline:  Goal status: INITIAL  2.  Patient to be independent with advanced HEP  Baseline:  Goal status: INITIAL  3.  Patient to be able to ascend and descend steps with pain no greater than 2/10  Baseline:  Goal status: INITIAL  4.  Patient to be able to bend, stoop and squat with pain no greater than 2/10  Baseline:  Goal status: INITIAL  5.  Patient to be able to stand or walk for at least 15 min without leg pain  Baseline:  Goal status: INITIAL  6.  Patient to report 85% improvement in overall symptoms  Baseline:  Goal status: INITIAL   PLAN:  PT FREQUENCY: 1-2x/week  PT DURATION: 8 weeks  PLANNED INTERVENTIONS: 97110-Therapeutic exercises, 97530- Therapeutic activity, [id number]- Neuromuscular re-education, 97535- Self Care, 47829- Manual therapy, [id number]- Gait training, [phone]- Aquatic Therapy, 97014- Electrical stimulation (unattended), [id number]- Electrical stimulation (manual), 97016- Vasopneumatic device, [id number]- Ultrasound, [id number]- Ionotophoresis 4mg /ml Dexamethasone, Patient/Family education, Balance training, Stair training, Taping, Dry Needling, Joint mobilization, Compression bandaging, Vestibular training, Visual/preceptual remediation/compensation, DME instructions, Cryotherapy, and Moist heat  PLAN FOR NEXT  SESSION: Work on step downs, eccentric strength. Progress HEP, Nustep, quad rehab and hip strengthening.   Solon Palm, PT 11/20/23 3:33 PM [facility] [address] [place], [place] [address] Phone # [phone] Fax [phone]

## 2023-11-20 ENCOUNTER — Ambulatory Visit: Payer: Medicare Other | Admitting: Physical Therapy

## 2023-11-20 ENCOUNTER — Encounter: Payer: Self-pay | Admitting: Physical Therapy

## 2023-11-20 DIAGNOSIS — M170 Bilateral primary osteoarthritis of knee: Secondary | ICD-10-CM | POA: Diagnosis not present

## 2023-11-20 DIAGNOSIS — R293 Abnormal posture: Secondary | ICD-10-CM | POA: Diagnosis not present

## 2023-11-20 DIAGNOSIS — R252 Cramp and spasm: Secondary | ICD-10-CM

## 2023-11-20 DIAGNOSIS — G8929 Other chronic pain: Secondary | ICD-10-CM

## 2023-11-20 DIAGNOSIS — M6281 Muscle weakness (generalized): Secondary | ICD-10-CM

## 2023-11-20 DIAGNOSIS — M25561 Pain in right knee: Secondary | ICD-10-CM | POA: Diagnosis not present

## 2023-11-20 DIAGNOSIS — R262 Difficulty in walking, not elsewhere classified: Secondary | ICD-10-CM

## 2023-11-20 DIAGNOSIS — M25562 Pain in left knee: Secondary | ICD-10-CM | POA: Diagnosis not present

## 2023-11-22 ENCOUNTER — Ambulatory Visit: Payer: Medicare Other

## 2023-11-22 DIAGNOSIS — M6281 Muscle weakness (generalized): Secondary | ICD-10-CM

## 2023-11-22 DIAGNOSIS — R252 Cramp and spasm: Secondary | ICD-10-CM

## 2023-11-22 DIAGNOSIS — M25561 Pain in right knee: Secondary | ICD-10-CM | POA: Diagnosis not present

## 2023-11-22 DIAGNOSIS — M25562 Pain in left knee: Secondary | ICD-10-CM | POA: Diagnosis not present

## 2023-11-22 DIAGNOSIS — R293 Abnormal posture: Secondary | ICD-10-CM | POA: Diagnosis not present

## 2023-11-22 DIAGNOSIS — R262 Difficulty in walking, not elsewhere classified: Secondary | ICD-10-CM | POA: Diagnosis not present

## 2023-11-22 DIAGNOSIS — G8929 Other chronic pain: Secondary | ICD-10-CM

## 2023-11-22 DIAGNOSIS — M170 Bilateral primary osteoarthritis of knee: Secondary | ICD-10-CM | POA: Diagnosis not present

## 2023-11-22 NOTE — Therapy (Addendum)
OUTPATIENT PHYSICAL THERAPY LOWER EXTREMITY TREATMENT   Patient Name: Abigail [name] MRN: [id number] DOB:26-Dec-1940, 83 y.o., female Today's Date: 11/22/2023  END OF SESSION:  PT End of Session - 11/22/23 1441     Visit Number 5    Date for PT Re-Evaluation 01/03/24    Authorization Type [employer]    Progress Note Due on Visit 10    PT Start Time 1441    PT Stop Time 1530    PT Time Calculation (min) 49 min    Activity Tolerance Patient tolerated treatment well    Behavior During Therapy WFL for tasks assessed/performed               Past Medical History:  Diagnosis Date   Arthritis    oa   Baker's cyst    behind left knee   Constipation    Hemorrhoids    Hypertension    MVP (mitral valve prolapse)    Neoplasm    of appendix   Rosacea    Past Surgical History:  Procedure Laterality Date   AUGMENTATION MAMMAPLASTY Bilateral 1990   saline   CARDIAC CATHETERIZATION  17years ago   small amount of blockage   CATRACTS Bilateral    COLONOSCOPY  2006 and oct 2018   ILEOCECETOMY N/A 08/17/2017   Procedure: OPEN ILEOCECETOMY;  Surgeon: Darnell Level, MD;  Location: [facility];  Service: General;  Laterality: N/A;  [facility] PATHWAY   Patient Active Problem List   Diagnosis Date Noted   Degenerative disc disease, lumbar 02/15/2023   Osteoporosis 09/22/2021   Greater trochanteric bursitis of left hip 11/04/2020   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   Gastroesophageal reflux disease 06/29/2017   Hypercholesterolemia 06/29/2017   Hypertensive disorder 06/29/2017   Mitral valve disorder 06/29/2017   Tear of LCL (lateral collateral ligament) of knee, left, subsequent encounter 08/09/2016   Degenerative arthritis of left knee 07/06/2016   Degenerative arthritis of right knee 02/25/2016   Piriformis syndrome of right side 06/24/2015   Primary localized osteoarthrosis, lower leg 06/13/2014   Baker's cyst of knee 05/20/2014    PCP: Pcp,  No   REFERRING PROVIDER: Judi Saa, DO  REFERRING DIAG: M25.561,M25.562,G89.29 (ICD-10-CM) - Chronic pain of both knees M71.22 (ICD-10-CM) - Synovial cyst of left knee M17.11 (ICD-10-CM) - Primary osteoarthritis of right knee M17.12 (ICD-10-CM) - Primary osteoarthritis of left knee  THERAPY DIAG:  Chronic pain of both knees  Difficulty in walking, not elsewhere classified  Muscle weakness (generalized)  Cramp and spasm  Abnormal posture  Primary osteoarthritis of both knees  Rationale for Evaluation and Treatment: Rehabilitation  ONSET DATE: 10/31/2023  SUBJECTIVE:   SUBJECTIVE STATEMENT: Doing pretty well.      From initial eval: Patient reports she has been see [name] for about 5 years for her knees.  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 does not feel like she needs TKA just yet.  She states she also has some medial joint line pain on the right but that her left knee is worse than her right.  She has frequent Baker's cysts on both knees and chronic pain.  She also reports fat pad irritation issues.  She has been having aspiration and cortisone injections about every 3 months to control the symptoms.  However, she is fairly active and has very little pain for about 2 1/2 months then she will have some return of pain just before  her next scheduled aspiration and injection.  She is retired and feels she is fairly active. She hopes to avoid TKA's in the near future and wants to gain her muscle back.    PERTINENT HISTORY: 10/31/23 both knees aspirated, [name] confirms end stage OA both knees.  PT for now.  F/U with [name] in 6-8 weeks PAIN:  Are you having pain? Yes: NPRS scale: 0 sitting still , currently no knee pain (when she has the cortisone and aspirations this will last for approx 3 months) Pain location: bilateral knees Pain description: aching Aggravating factors: stairs, bending, stooping and squatting Relieving factors: rest,  ice, aspiration and cortisone  PRECAUTIONS: None  RED FLAGS: None   WEIGHT BEARING RESTRICTIONS: No  FALLS:  Has patient fallen in last 6 months? No  LIVING ENVIRONMENT: Lives with: lives with their family Lives in: House/apartment Stairs: Yes: Internal: 12 steps; on right going up and External: 4 steps; on right going up Has following equipment at home:  none  (does not need any yet)  OCCUPATION: retired  PLOF: Independent, Independent with basic ADLs, Independent with household mobility without device, Independent with community mobility without device, Independent with homemaking with ambulation, Independent with gait, and Independent with transfers  PATIENT GOALS: to prolong need for TKA's  NEXT MD VISIT: 6-8 weeks from 10/31/23  OBJECTIVE:  Note: Objective measures were completed at Evaluation unless otherwise noted.  DIAGNOSTIC FINDINGS: xrays reveal end stage OA  PATIENT SURVEYS:  LEFS 64/80= 80%  COGNITION: Overall cognitive status: Within functional limits for tasks assessed     SENSATION: WFL  EDEMA:  Circumferential: mid patella (aspirated last on 10/31/23;  right:  34.3 cm    , Left:  35 cm     MUSCLE LENGTH: Hamstrings: Right 50 deg; Left 50 deg Thomas test: Right pos deg; Left pos deg  POSTURE: rounded shoulders and severe scoliosis   PALPATION: Severe crepitus bilateral PF and joint line  LOWER EXTREMITY ROM:  WFL  LOWER EXTREMITY MMT:  MMT Right eval Left eval  Hip flexion 3+ 3+  Hip extension 3+ 3+  Hip abduction 4- 4-  Hip adduction 4+ 4+  Hip internal rotation    Hip external rotation    Knee flexion 4- 4-  Knee extension 4- 4-  Ankle dorsiflexion    Ankle plantarflexion    Ankle inversion    Ankle eversion     (Blank rows = not tested)  LOWER EXTREMITY SPECIAL TESTS:  Knee special tests: Patellafemoral grind test: positive , Step up/down test: positive , and Patella tap test (ballotable patella): negative  FUNCTIONAL TESTS:   5 times sit to stand: 14.94 sec Timed up and go (TUG): 10.98 sec  GAIT: Distance walked: 30 Assistive device utilized: None Level of assistance: Modified independence Comments: antalgic  TREATMENT DATE: 11/22/23  Nustep x 5 min level 5 Supine IT band stretch Seated clam with yellow loop x 20 Standing lateral band walks with red loop in fig 8 around ankles x 8 laps of 6-7 steps each Seated LAQ with 2 lbs x 20 cues for slow pace Seated March x 20 with 2 lbs Seated hip ER 2 x 10 with 2 lbs  Prone HS curl with knees off EOB 2# 2 x 10 Supine SAQ with blue foam roller  2 x 10 with 2 lbs Supine SLR with 2 lbs 2 x 10 osteoporosisTrigger Point Dry Needling Initial Treatment: Pt instructed on Dry Needling rational, procedures, and possible side effects. Pt instructed to expect mild to moderate muscle soreness later in the day and/or into the next day.  Pt instructed in methods to reduce muscle soreness. Pt instructed to continue prescribed HEP. Because Dry Needling was performed over or adjacent to a lung field, pt was educated on S/S of pneumothorax and to seek immediate medical attention should they occur.  Patient was educated on signs and symptoms of infection and other risk factors and advised to seek medical attention should they occur.  Patient verbalized understanding of these instructions and education.  Patient Verbal Consent Given: Yes Education Handout Provided: Yes Muscles Treated: left lateral quad Electrical Stimulation Performed: No Treatment Response/Outcome: Skilled palpation used to identify taut bands and trigger points.  Once identified, dry needling techniques used to treat these areas.  Twitch response ellicited along with palpable elongation of muscle.  Following treatment, patient reported significant relief of discomfort and was able to  ambulate with less antalgic gait.    11/20/23  Nustep x 5 min level 5 Seated clam with yellow loop x 20 Standing lateral band walks with red loop in fig 8 around ankles x 8 laps of 6-7 steps each Seated LAQ with 2 lbs x 20 cues for slow pace Seated March x 20 with 2 lbs Seated hip ER 2 x 10 with 2 lbs  Prone HS curl with knees off EOB 2# 2 x 10 Supine SAQ with blue foam roller  2 x 10 with 2 lbs Supine SLR with 2 lbs 2 x 10 Decline squat rockerboard x 10 cues to sit bottom bwd Step downs 4" x 10 B - no pain Step down 2x each on 6 inch step no pain but needed cues for eccentric control.   11/16/23  Nustep x 5 min level 5 Seated clam with yellow loop x 20 Standing lateral band walks with blue loop x 8 laps of 6-7 steps each Seated LAQ with 2 lbs x 20 cues for slow pace Seated March x 20 with 2 lbs x 20 Seated hip ER 2 x 10 with 2 lbs  Supine TKE with blue foam roller 2 x 10 with 1 1/2 lbs  Supine SAQ with blue foam roller  2 x 10 with 2 lbs Supine SLR with 2 lbs 1 x 10 Prone HS curl 2# x 10 S/L hip ABD with legs at 45 degrees angle forward x 15 B S/L clam green band x 20 B Bridge x 20   11/14/23  Nustep x 5 min level 2 Seated clam with yellow loop x 20 Standing lateral band walks with blue loop x 2 laps of 10 steps each Seated LAQ with 1 1/2 lbs x 20 Seated March x 20 with 1 1/2 lbs x 20 Seated hip ER 2 x 10 with 1 1/2 lbs  Supine quad  set x 20 bilaterally Supine TKE with blue foam roller 2 x 10 with 1 1/2 lbs  Supine SAQ with larger blue bolster 2 x 10 with 1 1/2 lbs Supine SLR with 1 1/2 lbs 2 x 10    11/08/23  Initial eval completed and initiated HEP   PATIENT EDUCATION:  Education details: Initiated HEP Person educated: Patient Education method: Programmer, multimedia, Facilities manager, Verbal cues, and Handouts Education comprehension: verbalized understanding, returned demonstration, and verbal cues required  HOME EXERCISE PROGRAM: Access Code: [email or web address] URL:  [email or web address] Date: 11/08/2023 Prepared by: Mikey Kirschner  Exercises - Standing Hamstring Stretch on Chair  - 1 x daily - 7 x weekly - 1 sets - 3 reps - 30 sec hold - Standing Quad Stretch with Table and Chair Support  - 1 x daily - 7 x weekly - 1 sets - 3 reps - 30 sec hold - Supine ITB Stretch with Strap  - 1 x daily - 7 x weekly - 1 sets - 3 reps - 30 sec hold - Supine Quadricep Sets  - 1 x daily - 7 x weekly - 1 sets - 20 reps - Supine Knee Extension Strengthening  - 1 x daily - 7 x weekly - 1 sets - 20 reps - Small Range Straight Leg Raise  - 1 x daily - 7 x weekly - 2 sets - 10 reps - Seated Long Arc Quad  - 1 x daily - 7 x weekly - 1 sets - 20 reps - Side Stepping with Resistance at Ankles  - 1 x daily - 7 x weekly - 3 sets - 10 reps  ASSESSMENT:  CLINICAL IMPRESSION: Patient is progressing appropriately.  The Bakers cyst behind the left knee has become quite pronounced.  She says this is typical and she just goes and has is drained.  We talked, once again, at length about appropriate timeline for moving fwd with TKA.  She continues to consider this.  She did well today with all exercises.  We attempted DN to left lateral thigh. Post treatment, patient reported signficant relief of pain in the left thigh.    OBJECTIVE IMPAIRMENTS: Abnormal gait, decreased balance, decreased mobility, difficulty walking, decreased ROM, decreased strength, increased edema, increased fascial restrictions, increased muscle spasms, impaired flexibility, postural dysfunction, and pain.   ACTIVITY LIMITATIONS: carrying, lifting, bending, sitting, standing, squatting, sleeping, stairs, transfers, bed mobility, toileting, and caring for others  PARTICIPATION LIMITATIONS: meal prep, cleaning, laundry, driving, shopping, community activity, yard work, and church  PERSONAL FACTORS: Age, Fitness, Time since onset of injury/illness/exacerbation, and 1-2 comorbidities: Htn and mitral valve  prolapse  are also affecting patient's functional outcome.   REHAB POTENTIAL: Fair end stage OA  CLINICAL DECISION MAKING: Evolving/moderate complexity  EVALUATION COMPLEXITY: Moderate   GOALS: Goals reviewed with patient? Yes  SHORT TERM GOALS: Target date: 12/06/2023  Pain report to be no greater than 4/10  Baseline: Goal status: INITIAL  2.  Patient will be independent with initial HEP  Baseline:  Goal status: INITIAL   LONG TERM GOALS: Target date: 01/03/2024   Patient to report pain no greater than 2/10  Baseline:  Goal status: INITIAL  2.  Patient to be independent with advanced HEP  Baseline:  Goal status: INITIAL  3.  Patient to be able to ascend and descend steps with pain no greater than 2/10  Baseline:  Goal status: INITIAL  4.  Patient to be able to bend, stoop and squat with pain no greater than 2/10  Baseline:  Goal status: INITIAL  5.  Patient to be able to stand or walk for at least 15 min without leg pain  Baseline:  Goal status: INITIAL  6.  Patient to report 85% improvement in overall symptoms  Baseline:  Goal status: INITIAL   PLAN:  PT FREQUENCY: 1-2x/week  PT DURATION: 8 weeks  PLANNED INTERVENTIONS: 97110-Therapeutic exercises, 97530- Therapeutic activity, [id number]- Neuromuscular re-education, 97535- Self Care, [id number]- Manual therapy, [id number]- Gait training, [phone]- Aquatic Therapy, 97014- Electrical stimulation (unattended), [id number]- Electrical stimulation (manual), 97016- Vasopneumatic device, [id number]- Ultrasound, [id number]- Ionotophoresis 4mg /ml Dexamethasone, Patient/Family education, Balance training, Stair training, Taping, Dry Needling, Joint mobilization, Compression bandaging, Vestibular training, Visual/preceptual remediation/compensation, DME instructions, Cryotherapy, and Moist heat  PLAN FOR NEXT SESSION: Work on step downs, eccentric strength. Progress HEP, Nustep, quad rehab and hip strengthening.   Victorino Dike B. Onofre Gains, PT 11/22/23  11:21 PM [facility] [address], [address] [place], [place] [address] Phone # [phone] Fax [phone]

## 2023-11-26 NOTE — Therapy (Signed)
OUTPATIENT PHYSICAL THERAPY LOWER EXTREMITY TREATMENT   Patient Name: Abigail [name] MRN: [id number] DOB:November 27, 1940, 83 y.o., female Today's Date: 11/27/2023  END OF SESSION:  PT End of Session - 11/27/23 1729     Visit Number 6    Date for PT Re-Evaluation 01/03/24    Authorization Type [employer]    Progress Note Due on Visit 10    PT Start Time 1402    PT Stop Time 1443    PT Time Calculation (min) 41 min    Activity Tolerance Patient tolerated treatment well    Behavior During Therapy WFL for tasks assessed/performed                Past Medical History:  Diagnosis Date   Arthritis    oa   Baker's cyst    behind left knee   Constipation    Hemorrhoids    Hypertension    MVP (mitral valve prolapse)    Neoplasm    of appendix   Rosacea    Past Surgical History:  Procedure Laterality Date   AUGMENTATION MAMMAPLASTY Bilateral 1990   saline   CARDIAC CATHETERIZATION  17years ago   small amount of blockage   CATRACTS Bilateral    COLONOSCOPY  2006 and oct 2018   ILEOCECETOMY N/A 08/17/2017   Procedure: OPEN ILEOCECETOMY;  Surgeon: Darnell Level, MD;  Location: [facility];  Service: General;  Laterality: N/A;  [facility] PATH[facility]AY   Patient Active Problem List   Diagnosis Date Noted   Degenerative disc disease, lumbar 02/15/2023   Osteoporosis 09/22/2021   Greater trochanteric bursitis of left hip 11/04/2020   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   Gastroesophageal reflux disease 06/29/2017   Hypercholesterolemia 06/29/2017   Hypertensive disorder 06/29/2017   Mitral valve disorder 06/29/2017   Tear of LCL (lateral collateral ligament) of knee, left, subsequent encounter 08/09/2016   Degenerative arthritis of left knee 07/06/2016   Degenerative arthritis of right knee 02/25/2016   Piriformis syndrome of right side 06/24/2015   Primary localized osteoarthrosis, lower leg 06/13/2014   Baker's cyst of knee 05/20/2014    PCP: Pcp,  No   REFERRING PROVIDER: Judi Saa, DO  REFERRING DIAG: M25.561,M25.562,G89.29 (ICD-10-CM) - Chronic pain of both knees M71.22 (ICD-10-CM) - Synovial cyst of left knee M17.11 (ICD-10-CM) - Primary osteoarthritis of right knee M17.12 (ICD-10-CM) - Primary osteoarthritis of left knee  THERAPY DIAG:  Chronic pain of both knees  Difficulty in walking, not elsewhere classified  Muscle weakness (generalized)  Cramp and spasm  Abnormal posture  Primary osteoarthritis of both knees  Rationale for Evaluation and Treatment: Rehabilitation  ONSET DATE: 10/31/2023  SUBJECTIVE:   SUBJECTIVE STATEMENT: I went to a bridal shower this weekend and had to stand for 3 hours, so Sunday I was really hurting. The left leg is really hurting with stairs. The right side feels pretty good. Able to sit down without using her arms now.   From initial eval: Patient reports she has been see [name] for about 5 years for her knees.  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 does not feel like she needs TKA just yet.  She states she also has some medial joint line pain on the right but that her left knee is worse than her right.  She has frequent Baker's cysts on both knees and chronic pain.  She also reports fat pad irritation issues.  She has been  having aspiration and cortisone injections about every 3 months to control the symptoms.  However, she is fairly active and has very little pain for about 2 1/2 months then she will have some return of pain just before her next scheduled aspiration and injection.  She is retired and feels she is fairly active. She hopes to avoid TKA's in the near future and wants to gain her muscle back.    PERTINENT HISTORY: 10/31/23 both knees aspirated, [name] confirms end stage OA both knees.  PT for now.  F/U with [name] in 6-8 weeks PAIN:  Are you having pain? Yes: NPRS scale: 8/10 in L thigh with stairs Pain location: bilateral  knees Pain description: aching Aggravating factors: stairs, bending, stooping and squatting Relieving factors: rest, ice, aspiration and cortisone  PRECAUTIONS: None  RED FLAGS: None   WEIGHT BEARING RESTRICTIONS: No  FALLS:  Has patient fallen in last 6 months? No  LIVING ENVIRONMENT: Lives with: lives with their family Lives in: House/apartment Stairs: Yes: Internal: 12 steps; on right going up and External: 4 steps; on right going up Has following equipment at home:  none  (does not need any yet)  OCCUPATION: retired  PLOF: Independent, Independent with basic ADLs, Independent with household mobility without device, Independent with community mobility without device, Independent with homemaking with ambulation, Independent with gait, and Independent with transfers  PATIENT GOALS: to prolong need for TKA's  NEXT MD VISIT: 6-8 weeks from 10/31/23  OBJECTIVE:  Note: Objective measures were completed at Evaluation unless otherwise noted.  DIAGNOSTIC FINDINGS: xrays reveal end stage OA  PATIENT SURVEYS:  LEFS 64/80= 80%  COGNITION: Overall cognitive status: Within functional limits for tasks assessed     SENSATION: WFL  EDEMA:  Circumferential: mid patella (aspirated last on 10/31/23;  right:  34.3 cm    , Left:  35 cm     MUSCLE LENGTH: Hamstrings: Right 50 deg; Left 50 deg Thomas test: Right pos deg; Left pos deg  POSTURE: rounded shoulders and severe scoliosis   PALPATION: Severe crepitus bilateral PF and joint line  LOWER EXTREMITY ROM:  WFL  LOWER EXTREMITY MMT:  MMT Right eval Left eval  Hip flexion 3+ 3+  Hip extension 3+ 3+  Hip abduction 4- 4-  Hip adduction 4+ 4+  Hip internal rotation    Hip external rotation    Knee flexion 4- 4-  Knee extension 4- 4-  Ankle dorsiflexion    Ankle plantarflexion    Ankle inversion    Ankle eversion     (Blank rows = not tested)  LOWER EXTREMITY SPECIAL TESTS:  Knee special tests: Patellafemoral grind  test: positive , Step up/down test: positive , and Patella tap test (ballotable patella): negative  FUNCTIONAL TESTS:  5 times sit to stand: 14.94 sec Timed up and go (TUG): 10.98 sec  GAIT: Distance walked: 30 Assistive device utilized: None Level of assistance: Modified independence Comments: antalgic  TREATMENT DATE: 11/27/23  Bike L 3 x 5 min  Supine HS and IT band stretch 2x30 sec ea B Prone HS curl with knees off EOB 2# 2 x 10 B Supine SAQ with blue foam roller  2 x 10 with 2 lbs 5 sec Supine SLR with 2 lbs 2 x 10 Stairs up/down once post DN to assess response Manual: Trigger Point Dry Needling  Subsequent Treatment: Instructions provided previously at initial dry needling treatment.   Patient Verbal Consent Given: Yes Education Handout Provided: Previously Provided Muscles Treated: L lateral quads Electrical Stimulation Performed: No Treatment Response/Outcome: Utilized skilled palpation to identify bony landmarks and trigger points.  Able to illicit twitch response and muscle elongation.  Soft tissue mobilization to lateral quad following to further promote tissue elongation and decreased pain.      11/22/23  Nustep x 5 min level 5 Supine IT band stretch Seated clam with yellow loop x 20 Standing lateral band walks with red loop in fig 8 around ankles x 8 laps of 6-7 steps each Seated LAQ with 2 lbs x 20 cues for slow pace Seated March x 20 with 2 lbs Seated hip ER 2 x 10 with 2 lbs  Prone HS curl with knees off EOB 2# 2 x 10 Supine SAQ with blue foam roller  2 x 10 with 2 lbs Supine SLR with 2 lbs 2 x 10 Trigger Point Dry Needling Initial Treatment: Pt instructed on Dry Needling rational, procedures, and possible side effects. Pt instructed to expect mild to moderate muscle soreness later in the day and/or into the next day.  Pt instructed  in methods to reduce muscle soreness. Pt instructed to continue prescribed HEP. Because Dry Needling was performed over or adjacent to a lung field, pt was educated on S/S of pneumothorax and to seek immediate medical attention should they occur.  Patient was educated on signs and symptoms of infection and other risk factors and advised to seek medical attention should they occur.  Patient verbalized understanding of these instructions and education.  Patient Verbal Consent Given: Yes Education Handout Provided: Yes Muscles Treated: left lateral quad Electrical Stimulation Performed: No Treatment Response/Outcome: Skilled palpation used to identify taut bands and trigger points.  Once identified, dry needling techniques used to treat these areas.  Twitch response ellicited along with palpable elongation of muscle.  Following treatment, patient reported significant relief of discomfort and was able to ambulate with less antalgic gait.     11/20/23  Nustep x 5 min level 5 Seated clam with yellow loop x 20 Standing lateral band walks with red loop in fig 8 around ankles x 8 laps of 6-7 steps each Seated LAQ with 2 lbs x 20 cues for slow pace Seated March x 20 with 2 lbs Seated hip ER 2 x 10 with 2 lbs  Prone HS curl with knees off EOB 2# 2 x 10 Supine SAQ with blue foam roller  2 x 10 with 2 lbs Supine SLR with 2 lbs 2 x 10 Decline squat rockerboard x 10 cues to sit bottom bwd Step downs 4" x 10 B - no pain Step down 2x each on 6 inch step no pain but needed cues for eccentric control.   11/16/23  Nustep x 5 min level 5 Seated clam with yellow loop x 20 Standing lateral band walks with blue loop x 8 laps of 6-7 steps each Seated LAQ with 2 lbs x 20 cues for slow pace Seated March x  20 with 2 lbs x 20 Seated hip ER 2 x 10 with 2 lbs  Supine TKE with blue foam roller 2 x 10 with 1 1/2 lbs  Supine SAQ with blue foam roller  2 x 10 with 2 lbs Supine SLR with 2 lbs 1 x 10 Prone HS curl 2# x  10 S/L hip ABD with legs at 45 degrees angle forward x 15 B S/L clam green band x 20 B Bridge x 20   11/14/23  Nustep x 5 min level 2 Seated clam with yellow loop x 20 Standing lateral band walks with blue loop x 2 laps of 10 steps each Seated LAQ with 1 1/2 lbs x 20 Seated March x 20 with 1 1/2 lbs x 20 Seated hip ER 2 x 10 with 1 1/2 lbs  Supine quad set x 20 bilaterally Supine TKE with blue foam roller 2 x 10 with 1 1/2 lbs  Supine SAQ with larger blue bolster 2 x 10 with 1 1/2 lbs Supine SLR with 1 1/2 lbs 2 x 10    11/08/23  Initial eval completed and initiated HEP   PATIENT EDUCATION:  Education details: Initiated HEP Person educated: Patient Education method: Programmer, multimedia, Facilities manager, Verbal cues, and Handouts Education comprehension: verbalized understanding, returned demonstration, and verbal cues required  HOME EXERCISE PROGRAM: Access Code: [email or web address] URL: [email or web address] Date: 11/08/2023 Prepared by: Mikey Kirschner  Exercises - Standing Hamstring Stretch on Chair  - 1 x daily - 7 x weekly - 1 sets - 3 reps - 30 sec hold - Standing Quad Stretch with Table and Chair Support  - 1 x daily - 7 x weekly - 1 sets - 3 reps - 30 sec hold - Supine ITB Stretch with Strap  - 1 x daily - 7 x weekly - 1 sets - 3 reps - 30 sec hold - Supine Quadricep Sets  - 1 x daily - 7 x weekly - 1 sets - 20 reps - Supine Knee Extension Strengthening  - 1 x daily - 7 x weekly - 1 sets - 20 reps - Small Range Straight Leg Raise  - 1 x daily - 7 x weekly - 2 sets - 10 reps - Seated Long Arc Quad  - 1 x daily - 7 x weekly - 1 sets - 20 reps - Side Stepping with Resistance at Ankles  - 1 x daily - 7 x weekly - 3 sets - 10 reps  ASSESSMENT:  CLINICAL IMPRESSION: Abigail [name] responded well to DN last visit, but had increased pain over the weekend after standing for 3 hours. She reports ongoing pain primarily with stairs in the left thigh. She tolerated exercises well. She had  ongoing tension and trigger points in the left lateral thigh and had excellent response to DN and manual therapy. She was able to climb stairs without pain at end of session.   OBJECTIVE IMPAIRMENTS: Abnormal gait, decreased balance, decreased mobility, difficulty walking, decreased ROM, decreased strength, increased edema, increased fascial restrictions, increased muscle spasms, impaired flexibility, postural dysfunction, and pain.   ACTIVITY LIMITATIONS: carrying, lifting, bending, sitting, standing, squatting, sleeping, stairs, transfers, bed mobility, toileting, and caring for others  PARTICIPATION LIMITATIONS: meal prep, cleaning, laundry, driving, shopping, community activity, yard work, and church  PERSONAL FACTORS: Age, Fitness, Time since onset of injury/illness/exacerbation, and 1-2 comorbidities: Htn and mitral valve prolapse  are also affecting patient's functional outcome.   REHAB POTENTIAL: Fair end stage OA  CLINICAL DECISION MAKING: Evolving/moderate complexity  EVALUATION  COMPLEXITY: Moderate   GOALS: Goals reviewed with patient? Yes  SHORT TERM GOALS: Target date: 12/06/2023  Pain report to be no greater than 4/10  Baseline: Goal status: INITIAL  2.  Patient will be independent with initial HEP  Baseline:  Goal status: INITIAL   LONG TERM GOALS: Target date: 01/03/2024   Patient to report pain no greater than 2/10  Baseline:  Goal status: INITIAL  2.  Patient to be independent with advanced HEP  Baseline:  Goal status: INITIAL  3.  Patient to be able to ascend and descend steps with pain no greater than 2/10  Baseline:  Goal status: INITIAL  4.  Patient to be able to bend, stoop and squat with pain no greater than 2/10  Baseline:  Goal status: INITIAL  5.  Patient to be able to stand or walk for at least 15 min without leg pain  Baseline:  Goal status: INITIAL  6.  Patient to report 85% improvement in overall symptoms  Baseline:  Goal status:  INITIAL   PLAN:  PT FREQUENCY: 1-2x/week  PT DURATION: 8 weeks  PLANNED INTERVENTIONS: 97110-Therapeutic exercises, 97530- Therapeutic activity, [id number]- Neuromuscular re-education, 97535- Self Care, [id number]- Manual therapy, [id number]- Gait training, [phone]- Aquatic Therapy, 97014- Electrical stimulation (unattended), [id number]- Electrical stimulation (manual), 97016- Vasopneumatic device, [id number]- Ultrasound, [id number]- Ionotophoresis 4mg /ml Dexamethasone, Patient/Family education, Balance training, Stair training, Taping, Dry Needling, Joint mobilization, Compression bandaging, Vestibular training, Visual/preceptual remediation/compensation, DME instructions, Cryotherapy, and Moist heat  PLAN FOR NEXT SESSION: Work on step downs, eccentric strength. Progress HEP, Nustep, quad rehab and hip strengthening.   Solon Palm, PT 11/27/23 5:30 PM [facility] [address] [place], [place] [address] Phone # [phone] Fax [phone]

## 2023-11-27 ENCOUNTER — Ambulatory Visit: Payer: Medicare Other | Attending: Family Medicine | Admitting: Physical Therapy

## 2023-11-27 ENCOUNTER — Encounter: Payer: Self-pay | Admitting: Physical Therapy

## 2023-11-27 DIAGNOSIS — R252 Cramp and spasm: Secondary | ICD-10-CM | POA: Diagnosis not present

## 2023-11-27 DIAGNOSIS — M170 Bilateral primary osteoarthritis of knee: Secondary | ICD-10-CM | POA: Diagnosis not present

## 2023-11-27 DIAGNOSIS — M25561 Pain in right knee: Secondary | ICD-10-CM | POA: Insufficient documentation

## 2023-11-27 DIAGNOSIS — G8929 Other chronic pain: Secondary | ICD-10-CM | POA: Insufficient documentation

## 2023-11-27 DIAGNOSIS — R262 Difficulty in walking, not elsewhere classified: Secondary | ICD-10-CM | POA: Diagnosis not present

## 2023-11-27 DIAGNOSIS — M25562 Pain in left knee: Secondary | ICD-10-CM | POA: Diagnosis not present

## 2023-11-27 DIAGNOSIS — M6281 Muscle weakness (generalized): Secondary | ICD-10-CM | POA: Diagnosis not present

## 2023-11-27 DIAGNOSIS — R293 Abnormal posture: Secondary | ICD-10-CM | POA: Diagnosis not present

## 2023-11-29 ENCOUNTER — Ambulatory Visit: Payer: Medicare Other

## 2023-11-29 DIAGNOSIS — M25562 Pain in left knee: Secondary | ICD-10-CM | POA: Diagnosis not present

## 2023-11-29 DIAGNOSIS — R252 Cramp and spasm: Secondary | ICD-10-CM

## 2023-11-29 DIAGNOSIS — R293 Abnormal posture: Secondary | ICD-10-CM

## 2023-11-29 DIAGNOSIS — M6281 Muscle weakness (generalized): Secondary | ICD-10-CM | POA: Diagnosis not present

## 2023-11-29 DIAGNOSIS — G8929 Other chronic pain: Secondary | ICD-10-CM

## 2023-11-29 DIAGNOSIS — R262 Difficulty in walking, not elsewhere classified: Secondary | ICD-10-CM

## 2023-11-29 DIAGNOSIS — M170 Bilateral primary osteoarthritis of knee: Secondary | ICD-10-CM | POA: Diagnosis not present

## 2023-11-29 DIAGNOSIS — M25561 Pain in right knee: Secondary | ICD-10-CM | POA: Diagnosis not present

## 2023-11-29 NOTE — Therapy (Signed)
 OUTPATIENT PHYSICAL THERAPY LOWER EXTREMITY TREATMENT   Patient Name: Abigail [name] MRN: [id number] DOB:1940/12/28, 83 y.o., female Today's Date: 11/30/2023  END OF SESSION:  PT End of Session - 11/29/23 1547     Visit Number 7    Date for PT Re-Evaluation 01/03/24    Authorization Type [employer]    Progress Note Due on Visit 10    PT Start Time 1533    PT Stop Time 1615    PT Time Calculation (min) 42 min    Activity Tolerance Patient tolerated treatment well    Behavior During Therapy WFL for tasks assessed/performed                Past Medical History:  Diagnosis Date   Arthritis    oa   Baker's cyst    behind left knee   Constipation    Hemorrhoids    Hypertension    MVP (mitral valve prolapse)    Neoplasm    of appendix   Rosacea    Past Surgical History:  Procedure Laterality Date   AUGMENTATION MAMMAPLASTY Bilateral 1990   saline   CARDIAC CATHETERIZATION  17years ago   small amount of blockage   CATRACTS Bilateral    COLONOSCOPY  2006 and oct 2018   ILEOCECETOMY N/A 08/17/2017   Procedure: OPEN ILEOCECETOMY;  Surgeon: Eletha Boas, MD;  Location: [facility];  Service: General;  Laterality: N/A;  [facility] PATHWAY   Patient Active Problem List   Diagnosis Date Noted   Degenerative disc disease, lumbar 02/15/2023   Osteoporosis 09/22/2021   Greater trochanteric bursitis of left hip 11/04/2020   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   Gastroesophageal reflux disease 06/29/2017   Hypercholesterolemia 06/29/2017   Hypertensive disorder 06/29/2017   Mitral valve disorder 06/29/2017   Tear of LCL (lateral collateral ligament) of knee, left, subsequent encounter 08/09/2016   Degenerative arthritis of left knee 07/06/2016   Degenerative arthritis of right knee 02/25/2016   Piriformis syndrome of right side 06/24/2015   Primary localized osteoarthrosis, lower leg 06/13/2014   Baker's cyst of knee 05/20/2014    PCP: Pcp,  No   REFERRING PROVIDER: Claudene Arthea HERO, DO  REFERRING DIAG: M25.561,M25.562,G89.29 (ICD-10-CM) - Chronic pain of both knees M71.22 (ICD-10-CM) - Synovial cyst of left knee M17.11 (ICD-10-CM) - Primary osteoarthritis of right knee M17.12 (ICD-10-CM) - Primary osteoarthritis of left knee  THERAPY DIAG:  Chronic pain of both knees  Difficulty in walking, not elsewhere classified  Muscle weakness (generalized)  Cramp and spasm  Abnormal posture  Rationale for Evaluation and Treatment: Rehabilitation  ONSET DATE: 10/31/2023  SUBJECTIVE:   SUBJECTIVE STATEMENT: Patient reports left lateral hip pain has been primary issue since last visit.    From initial eval: Patient reports she has been see Dr. Claudene for about 5 years for her knees.  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 Claudene does not feel like she needs TKA just yet.  She states she also has some medial joint line pain on the right but that her left knee is worse than her right.  She has frequent Baker's cysts on both knees and chronic pain.  She also reports fat pad irritation issues.  She has been having aspiration and cortisone injections about every 3 months to control the symptoms.  However, she is fairly active and has very little pain for about 2 1/2 months then she will have some return of  pain just before her next scheduled aspiration and injection.  She is retired and feels she is fairly active. She hopes to avoid TKA's in the near future and wants to gain her muscle back.    PERTINENT HISTORY: 10/31/23 both knees aspirated, Dr. Claudene confirms end stage OA both knees.  PT for now.  F/U with Dr. Claudene in 6-8 weeks PAIN:  Are you having pain? Yes: NPRS scale: 8/10 in L thigh with stairs Pain location: bilateral knees Pain description: aching Aggravating factors: stairs, bending, stooping and squatting Relieving factors: rest, ice, aspiration and cortisone  PRECAUTIONS: None  RED  FLAGS: None   WEIGHT BEARING RESTRICTIONS: No  FALLS:  Has patient fallen in last 6 months? No  LIVING ENVIRONMENT: Lives with: lives with their family Lives in: House/apartment Stairs: Yes: Internal: 12 steps; on right going up and External: 4 steps; on right going up Has following equipment at home:  none  (does not need any yet)  OCCUPATION: retired  PLOF: Independent, Independent with basic ADLs, Independent with household mobility without device, Independent with community mobility without device, Independent with homemaking with ambulation, Independent with gait, and Independent with transfers  PATIENT GOALS: to prolong need for TKA's  NEXT MD VISIT: 6-8 weeks from 10/31/23  OBJECTIVE:  Note: Objective measures were completed at Evaluation unless otherwise noted.  DIAGNOSTIC FINDINGS: xrays reveal end stage OA  PATIENT SURVEYS:  LEFS 64/80= 80%  COGNITION: Overall cognitive status: Within functional limits for tasks assessed     SENSATION: WFL  EDEMA:  Circumferential: mid patella (aspirated last on 10/31/23;  right:  34.3 cm    , Left:  35 cm     MUSCLE LENGTH: Hamstrings: Right 50 deg; Left 50 deg Thomas test: Right pos deg; Left pos deg  POSTURE: rounded shoulders and severe scoliosis   PALPATION: Severe crepitus bilateral PF and joint line  LOWER EXTREMITY ROM:  WFL  LOWER EXTREMITY MMT:  MMT Right eval Left eval  Hip flexion 3+ 3+  Hip extension 3+ 3+  Hip abduction 4- 4-  Hip adduction 4+ 4+  Hip internal rotation    Hip external rotation    Knee flexion 4- 4-  Knee extension 4- 4-  Ankle dorsiflexion    Ankle plantarflexion    Ankle inversion    Ankle eversion     (Blank rows = not tested)  LOWER EXTREMITY SPECIAL TESTS:  Knee special tests: Patellafemoral grind test: positive , Step up/down test: positive , and Patella tap test (ballotable patella): negative  FUNCTIONAL TESTS:  5 times sit to stand: 14.94 sec Timed up and go (TUG):  10.98 sec  GAIT: Distance walked: 30 Assistive device utilized: None Level of assistance: Modified independence Comments: antalgic                                                                                                                                TREATMENT DATE: 11/29/23  Nustep L 3 x 5 min  Hip matrix: abd and extension 2 x 10 each LE with 25# Step downs 4 x 10 each LE: (left LE quite painful but completed 10 reps) Rocker board x 2 min  Right side lying IT band rolling x 1 min Supine HS and IT band stretch 3 x 30 sec ea B Supine SAQ with blue foam roller  2 x 10 with 3 lbs  Supine SAQ with larger blue bolster 2 x 10 with 3 lbs Supine SLR with 3 lbs 2 x 10  11/27/23  Bike L 3 x 5 min  Supine HS and IT band stretch 2x30 sec ea B Prone HS curl with knees off EOB 2# 2 x 10 B Supine SAQ with blue foam roller  2 x 10 with 2 lbs 5 sec Supine SLR with 2 lbs 2 x 10 Stairs up/down once post DN to assess response Manual: Trigger Point Dry Needling Subsequent Treatment: Instructions provided previously at initial dry needling treatment.  Patient Verbal Consent Given: Yes Education Handout Provided: Previously Provided Muscles Treated: L lateral quads Electrical Stimulation Performed: No Treatment Response/Outcome: Utilized skilled palpation to identify bony landmarks and trigger points.  Able to illicit twitch response and muscle elongation.  Soft tissue mobilization to lateral quad following to further promote tissue elongation and decreased pain.      11/22/23  Nustep x 5 min level 5 Supine IT band stretch Seated clam with yellow loop x 20 Standing lateral band walks with red loop in fig 8 around ankles x 8 laps of 6-7 steps each Seated LAQ with 2 lbs x 20 cues for slow pace Seated March x 20 with 2 lbs Seated hip ER 2 x 10 with 2 lbs  Prone HS curl with knees off EOB 2# 2 x 10 Supine SAQ with blue foam roller  2 x 10 with 2 lbs Supine SLR with 2 lbs 2 x 10 Trigger  Point Dry Needling Initial Treatment: Pt instructed on Dry Needling rational, procedures, and possible side effects. Pt instructed to expect mild to moderate muscle soreness later in the day and/or into the next day.  Pt instructed in methods to reduce muscle soreness. Pt instructed to continue prescribed HEP. Because Dry Needling was performed over or adjacent to a lung field, pt was educated on S/S of pneumothorax and to seek immediate medical attention should they occur.  Patient was educated on signs and symptoms of infection and other risk factors and advised to seek medical attention should they occur.  Patient verbalized understanding of these instructions and education.  Patient Verbal Consent Given: Yes Education Handout Provided: Yes Muscles Treated: left lateral quad Electrical Stimulation Performed: No Treatment Response/Outcome: Skilled palpation used to identify taut bands and trigger points.  Once identified, dry needling techniques used to treat these areas.  Twitch response ellicited along with palpable elongation of muscle.  Following treatment, patient reported significant relief of discomfort and was able to ambulate with less antalgic gait.      11/08/23  Initial eval completed and initiated HEP   PATIENT EDUCATION:  Education details: Initiated HEP Person educated: Patient Education method: Programmer, Multimedia, Facilities Manager, Verbal cues, and Handouts Education comprehension: verbalized understanding, returned demonstration, and verbal cues required  HOME EXERCISE PROGRAM: Access Code: [email or web address] URL: [email or web address] Date: 11/08/2023 Prepared by: Delon Haddock  Exercises - Standing Hamstring Stretch on Chair  - 1 x daily - 7 x weekly - 1 sets - 3 reps - 30 sec hold -  Standing Lobbyist with Table and Chair Support  - 1 x daily - 7 x weekly - 1 sets - 3 reps - 30 sec hold - Supine ITB Stretch with Strap  - 1 x daily - 7 x weekly - 1 sets - 3 reps -  30 sec hold - Supine Quadricep Sets  - 1 x daily - 7 x weekly - 1 sets - 20 reps - Supine Knee Extension Strengthening  - 1 x daily - 7 x weekly - 1 sets - 20 reps - Small Range Straight Leg Raise  - 1 x daily - 7 x weekly - 2 sets - 10 reps - Seated Long Arc Quad  - 1 x daily - 7 x weekly - 1 sets - 20 reps - Side Stepping with Resistance at Ankles  - 1 x daily - 7 x weekly - 3 sets - 10 reps  ASSESSMENT:  CLINICAL IMPRESSION: Abigail [name] was having some increased left IT band pain today.  We addressed this with stretching and rolling which seemed to have diminished her pain level by end of session.  She was able to walk with normal heel to toe progress and less antalgic gait upon leaving clinic.  Her pronounced scoliosis affects her progress but she is well motivated and compliant.  She should continue to do well.  She will benefit from continued skilled PT to meet all goals.    OBJECTIVE IMPAIRMENTS: Abnormal gait, decreased balance, decreased mobility, difficulty walking, decreased ROM, decreased strength, increased edema, increased fascial restrictions, increased muscle spasms, impaired flexibility, postural dysfunction, and pain.   ACTIVITY LIMITATIONS: carrying, lifting, bending, sitting, standing, squatting, sleeping, stairs, transfers, bed mobility, toileting, and caring for others  PARTICIPATION LIMITATIONS: meal prep, cleaning, laundry, driving, shopping, community activity, yard work, and church  PERSONAL FACTORS: Age, Fitness, Time since onset of injury/illness/exacerbation, and 1-2 comorbidities: Htn and mitral valve prolapse  are also affecting patient's functional outcome.   REHAB POTENTIAL: Fair end stage OA  CLINICAL DECISION MAKING: Evolving/moderate complexity  EVALUATION COMPLEXITY: Moderate   GOALS: Goals reviewed with patient? Yes  SHORT TERM GOALS: Target date: 12/06/2023  Pain report to be no greater than 4/10  Baseline: Goal status: In progress  2.  Patient will  be independent with initial HEP  Baseline:  Goal status: In progress   LONG TERM GOALS: Target date: 01/03/2024   Patient to report pain no greater than 2/10  Baseline:  Goal status: INITIAL  2.  Patient to be independent with advanced HEP  Baseline:  Goal status: INITIAL  3.  Patient to be able to ascend and descend steps with pain no greater than 2/10  Baseline:  Goal status: INITIAL  4.  Patient to be able to bend, stoop and squat with pain no greater than 2/10  Baseline:  Goal status: INITIAL  5.  Patient to be able to stand or walk for at least 15 min without leg pain  Baseline:  Goal status: INITIAL  6.  Patient to report 85% improvement in overall symptoms  Baseline:  Goal status: INITIAL   PLAN:  PT FREQUENCY: 1-2x/week  PT DURATION: 8 weeks  PLANNED INTERVENTIONS: 97110-Therapeutic exercises, 97530- Therapeutic activity, [id number]- Neuromuscular re-education, 97535- Self Care, [id number]- Manual therapy, [id number]- Gait training, [phone]- Aquatic Therapy, [id number]- Electrical stimulation (unattended), [id number]- Electrical stimulation (manual), [id number]- Vasopneumatic device, [id number]- Ultrasound, [id number]- Ionotophoresis 4mg /ml Dexamethasone , Patient/Family education, Balance training, Stair training, Taping, Dry Needling, Joint mobilization, Compression bandaging, Vestibular training, Visual/preceptual remediation/compensation, DME instructions,  Cryotherapy, and Moist heat  PLAN FOR NEXT SESSION: Step downs are still quite painful, possibly try very low step (2), eccentric strength. Progress HEP, Nustep, quad rehab and hip strengthening.   Delon B. Jonte Shiller, PT 11/30/23 9:05 AM [facility] [address], [address] [place], [place] [address] Phone # [phone] Fax [phone]

## 2023-12-03 NOTE — Therapy (Signed)
 OUTPATIENT PHYSICAL THERAPY LOWER EXTREMITY TREATMENT   Patient Name: Abigail [name] MRN: [id number] DOB:10-18-1941, 83 y.o., female Today's Date: 12/04/2023  END OF SESSION:  PT End of Session - 12/04/23 1535     Visit Number 8    Date for PT Re-Evaluation 01/03/24    Authorization Type [employer]    Progress Note Due on Visit 10    PT Start Time 1536    PT Stop Time 1621    PT Time Calculation (min) 45 min    Activity Tolerance Patient tolerated treatment well    Behavior During Therapy WFL for tasks assessed/performed                 Past Medical History:  Diagnosis Date   Arthritis    oa   Baker's cyst    behind left knee   Constipation    Hemorrhoids    Hypertension    MVP (mitral valve prolapse)    Neoplasm    of appendix   Rosacea    Past Surgical History:  Procedure Laterality Date   AUGMENTATION MAMMAPLASTY Bilateral 1990   saline   CARDIAC CATHETERIZATION  17years ago   small amount of blockage   CATRACTS Bilateral    COLONOSCOPY  2006 and oct 2018   ILEOCECETOMY N/A 08/17/2017   Procedure: OPEN ILEOCECETOMY;  Surgeon: Oralee Billow, MD;  Location: [facility];  Service: General;  Laterality: N/A;  [facility]   Patient Active Problem List   Diagnosis Date Noted   Degenerative disc disease, lumbar 02/15/2023   Osteoporosis 09/22/2021   Greater trochanteric bursitis of left hip 11/04/2020   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   Gastroesophageal reflux disease 06/29/2017   Hypercholesterolemia 06/29/2017   Hypertensive disorder 06/29/2017   Mitral valve disorder 06/29/2017   Tear of LCL (lateral collateral ligament) of knee, left, subsequent encounter 08/09/2016   Degenerative arthritis of left knee 07/06/2016   Degenerative arthritis of right knee 02/25/2016   Piriformis syndrome of right side 06/24/2015   Primary localized osteoarthrosis, lower leg 06/13/2014   Baker's cyst of knee 05/20/2014    PCP: Pcp,  No   REFERRING PROVIDER: Isidro Margo, DO  REFERRING DIAG: M25.561,M25.562,G89.29 (ICD-10-CM) - Chronic pain of both knees M71.22 (ICD-10-CM) - Synovial cyst of left knee M17.11 (ICD-10-CM) - Primary osteoarthritis of right knee M17.12 (ICD-10-CM) - Primary osteoarthritis of left knee  THERAPY DIAG:  Chronic pain of both knees  Difficulty in walking, not elsewhere classified  Muscle weakness (generalized)  Cramp and spasm  Abnormal posture  Rationale for Evaluation and Treatment: Rehabilitation  ONSET DATE: 10/31/2023  SUBJECTIVE:   SUBJECTIVE STATEMENT: Took aspirin so feeling better, but 8/10 this morning and yesterday.   From initial eval: Patient reports she has been see Dr. Felipe Horton for about 5 years for her knees.  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 Felipe Horton does not feel like she needs TKA just yet.  She states she also has some medial joint line pain on the right but that her left knee is worse than her right.  She has frequent Baker's cysts on both knees and chronic pain.  She also reports fat pad irritation issues.  She has been having aspiration and cortisone injections about every 3 months to control the symptoms.  However, she is fairly active and has very little pain for about 2 1/2 months then she will have some return of pain just  before her next scheduled aspiration and injection.  She is retired and feels she is fairly active. She hopes to avoid TKA's in the near future and wants to gain her muscle back.    PERTINENT HISTORY: 10/31/23 both knees aspirated, Dr. Felipe Horton confirms end stage OA both knees.  PT for now.  F/U with Dr. Felipe Horton in 6-8 weeks PAIN:  Are you having pain? Yes: NPRS scale: 8/10 in L thigh with stairs Pain location: bilateral knees Pain description: aching Aggravating factors: stairs, bending, stooping and squatting Relieving factors: rest, ice, aspiration and cortisone  PRECAUTIONS: None  RED FLAGS: None   WEIGHT  BEARING RESTRICTIONS: No  FALLS:  Has patient fallen in last 6 months? No  LIVING ENVIRONMENT: Lives with: lives with their family Lives in: House/apartment Stairs: Yes: Internal: 12 steps; on right going up and External: 4 steps; on right going up Has following equipment at home:  none  (does not need any yet)  OCCUPATION: retired  PLOF: Independent, Independent with basic ADLs, Independent with household mobility without device, Independent with community mobility without device, Independent with homemaking with ambulation, Independent with gait, and Independent with transfers  PATIENT GOALS: to prolong need for TKA's  NEXT MD VISIT: 6-8 weeks from 10/31/23  OBJECTIVE:  Note: Objective measures were completed at Evaluation unless otherwise noted.  DIAGNOSTIC FINDINGS: xrays reveal end stage OA  PATIENT SURVEYS:  LEFS 64/80= 80%  COGNITION: Overall cognitive status: Within functional limits for tasks assessed     SENSATION: WFL  EDEMA:  Circumferential: mid patella (aspirated last on 10/31/23;  right:  34.3 cm    , Left:  35 cm     MUSCLE LENGTH: Hamstrings: Right 50 deg; Left 50 deg Thomas test: Right pos deg; Left pos deg  POSTURE: rounded shoulders and severe scoliosis   PALPATION: Severe crepitus bilateral PF and joint line  LOWER EXTREMITY ROM:  WFL  LOWER EXTREMITY MMT:  MMT Right eval Left eval  Hip flexion 3+ 3+  Hip extension 3+ 3+  Hip abduction 4- 4-  Hip adduction 4+ 4+  Hip internal rotation    Hip external rotation    Knee flexion 4- 4-  Knee extension 4- 4-  Ankle dorsiflexion    Ankle plantarflexion    Ankle inversion    Ankle eversion     (Blank rows = not tested)  LOWER EXTREMITY SPECIAL TESTS:  Knee special tests: Patellafemoral grind test: positive , Step up/down test: positive , and Patella tap test (ballotable patella): negative  FUNCTIONAL TESTS:  5 times sit to stand: 14.94 sec Timed up and go (TUG): 10.98  sec  GAIT: Distance walked: 30 Assistive device utilized: None Level of assistance: Modified independence Comments: antalgic                                                                                                                                TREATMENT DATE: 12/04/23  Discussed status  and that she feels exercises are making her L leg pain worse, discussed bursitis with visual aides provided, assessed posture (even pelvic landmarks, left lateral shift and scoliosis) Hip matrix: abd and extension 1 x 10 each LE with 25# Step down  2" 10  and 4"  x 10 B - pain in the left knee at end of second set. Hip hike B x 10 on stairs Lateral glide B x 5 feels best on L Doorway QL stretch B Side lumbar stretch B with pillow under waist and top leg hanging bwd off EOB   11/29/23  Nustep L 3 x 5 min  Hip matrix: abd and extension 2 x 10 each LE with 25# Step downs 4" x 10 each LE: (left LE quite painful but completed 10 reps) Rocker board x 2 min  Right side lying IT band rolling x 1 min Supine HS and IT band stretch 3 x 30 sec ea B Supine SAQ with blue foam roller  2 x 10 with 3 lbs  Supine SAQ with larger blue bolster 2 x 10 with 3 lbs Supine SLR with 3 lbs 2 x 10  11/27/23  Bike L 3 x 5 min  Supine HS and IT band stretch 2x30 sec ea B Prone HS curl with knees off EOB 2# 2 x 10 B Supine SAQ with blue foam roller  2 x 10 with 2 lbs 5 sec Supine SLR with 2 lbs 2 x 10 Stairs up/down once post DN to assess response Manual: Trigger Point Dry Needling Subsequent Treatment: Instructions provided previously at initial dry needling treatment.  Patient Verbal Consent Given: Yes Education Handout Provided: Previously Provided Muscles Treated: L lateral quads Electrical Stimulation Performed: No Treatment Response/Outcome: Utilized skilled palpation to identify bony landmarks and trigger points.  Able to illicit twitch response and muscle elongation.  Soft tissue mobilization to lateral quad  following to further promote tissue elongation and decreased pain.      11/22/23  Nustep x 5 min level 5 Supine IT band stretch Seated clam with yellow loop x 20 Standing lateral band walks with red loop in fig 8 around ankles x 8 laps of 6-7 steps each Seated LAQ with 2 lbs x 20 cues for slow pace Seated March x 20 with 2 lbs Seated hip ER 2 x 10 with 2 lbs  Prone HS curl with knees off EOB 2# 2 x 10 Supine SAQ with blue foam roller  2 x 10 with 2 lbs Supine SLR with 2 lbs 2 x 10 Trigger Point Dry Needling Initial Treatment: Pt instructed on Dry Needling rational, procedures, and possible side effects. Pt instructed to expect mild to moderate muscle soreness later in the day and/or into the next day.  Pt instructed in methods to reduce muscle soreness. Pt instructed to continue prescribed HEP. Because Dry Needling was performed over or adjacent to a lung field, pt was educated on S/S of pneumothorax and to seek immediate medical attention should they occur.  Patient was educated on signs and symptoms of infection and other risk factors and advised to seek medical attention should they occur.  Patient verbalized understanding of these instructions and education.  Patient Verbal Consent Given: Yes Education Handout Provided: Yes Muscles Treated: left lateral quad Electrical Stimulation Performed: No Treatment Response/Outcome: Skilled palpation used to identify taut bands and trigger points.  Once identified, dry needling techniques used to treat these areas.  Twitch response ellicited along with palpable elongation of muscle.  Following  treatment, patient reported significant relief of discomfort and was able to ambulate with less antalgic gait.      11/08/23  Initial eval completed and initiated HEP   PATIENT EDUCATION:  Education details: Initiated HEP Person educated: Patient Education method: Programmer, multimedia, Facilities manager, Verbal cues, and Handouts Education comprehension:  verbalized understanding, returned demonstration, and verbal cues required  HOME EXERCISE PROGRAM: Access Code: [email or web address] URL: [email or web address] Date: 12/04/2023 Prepared by: Concha Deed  Exercises - Sidelying Thoracic Rotation with Open Book  - 1 x daily - 7 x weekly - 1-2 sets - 10 reps - Seated Cervical Retraction  - 1 x daily - 7 x weekly - 2 sets - 10 reps - Shoulder External Rotation and Scapular Retraction with Resistance  - 1 x daily - 7 x weekly - 2 sets - 10 reps - Standing Shoulder Horizontal Abduction with Resistance  - 1 x daily - 7 x weekly - 2 sets - 10 reps - Standing Shoulder Row with Anchored Resistance  - 1 x daily - 7 x weekly - 2 sets - 10 reps - Shoulder extension with resistance - Neutral  - 1 x daily - 7 x weekly - 2 sets - 10 reps - Cat Cow  - 1 x daily - 7 x weekly - 2 sets - 10 reps - Quadruped Full Range Thoracic Rotation with Reach  - 2 x daily - 7 x weekly - 2 sets - 10 reps - Child's Pose Stretch  - 1 x daily - 7 x weekly - 1 sets - 3 reps - 20-30 sec hold - Left Standing Lateral Shift Correction at Wall - Hold  - 2 x daily - 7 x weekly - 1 sets - 3 reps - 20-30 sec hold - Standing Quadratus Lumborum Stretch with Doorway  - 2 x daily - 7 x weekly - 1 sets - 2 reps - 30 sec hold - Sidelying ITB and lumbar stretch (Mirrored)  - 2 x daily - 7 x weekly - 1 sets - 3 reps - 30-60 seconds hold  ASSESSMENT:  CLINICAL IMPRESSION: Patient presents with ongoing high level pain in the left upper leg and some pain into the back. After discussion of status and assessing posture again, pt advised to hold cross body stretch for hip (for the time being) and add in a left lateral shift and B lumbar stretch. Different options provided in HEP. Patient reported that all stretches felt good. Patient to continue HS and quad stretches. Better tolerance to step downs today, but still had pain at end of 4 inch set.   OBJECTIVE IMPAIRMENTS: Abnormal gait, decreased balance,  decreased mobility, difficulty walking, decreased ROM, decreased strength, increased edema, increased fascial restrictions, increased muscle spasms, impaired flexibility, postural dysfunction, and pain.   ACTIVITY LIMITATIONS: carrying, lifting, bending, sitting, standing, squatting, sleeping, stairs, transfers, bed mobility, toileting, and caring for others  PARTICIPATION LIMITATIONS: meal prep, cleaning, laundry, driving, shopping, community activity, yard work, and church  PERSONAL FACTORS: Age, Fitness, Time since onset of injury/illness/exacerbation, and 1-2 comorbidities: Htn and mitral valve prolapse  are also affecting patient's functional outcome.   REHAB POTENTIAL: Fair end stage OA  CLINICAL DECISION MAKING: Evolving/moderate complexity  EVALUATION COMPLEXITY: Moderate   GOALS: Goals reviewed with patient? Yes  SHORT TERM GOALS: Target date: 12/06/2023  Pain report to be no greater than 4/10  Baseline: Goal status: In progress  2.  Patient will be independent with initial HEP  Baseline:  Goal status: In progress  LONG TERM GOALS: Target date: 01/03/2024   Patient to report pain no greater than 2/10  Baseline:  Goal status: INITIAL  2.  Patient to be independent with advanced HEP  Baseline:  Goal status: INITIAL  3.  Patient to be able to ascend and descend steps with pain no greater than 2/10  Baseline:  Goal status: INITIAL  4.  Patient to be able to bend, stoop and squat with pain no greater than 2/10  Baseline:  Goal status: INITIAL  5.  Patient to be able to stand or walk for at least 15 min without leg pain  Baseline:  Goal status: INITIAL  6.  Patient to report 85% improvement in overall symptoms  Baseline:  Goal status: INITIAL   PLAN:  PT FREQUENCY: 1-2x/week  PT DURATION: 8 weeks  PLANNED INTERVENTIONS: 97110-Therapeutic exercises, 97530- Therapeutic activity, [id number]- Neuromuscular re-education, 97535- Self Care, [id number]- Manual therapy,  [id number]- Gait training, [phone]- Aquatic Therapy, 97014- Electrical stimulation (unattended), [id number]- Electrical stimulation (manual), 97016- Vasopneumatic device, [id number]- Ultrasound, [id number]- Ionotophoresis 4mg /ml Dexamethasone , Patient/Family education, Balance training, Stair training, Taping, Dry Needling, Joint mobilization, Compression bandaging, Vestibular training, Visual/preceptual remediation/compensation, DME instructions, Cryotherapy, and Moist heat  PLAN FOR NEXT SESSION: Assess response to lumbar stretches and holding hip stretch.    Jinx Mourning, PT 12/04/23 4:39 PM [facility] [address], [address] [place], [place] [address] Phone # [phone] Fax [phone]

## 2023-12-04 ENCOUNTER — Encounter: Payer: Self-pay | Admitting: Physical Therapy

## 2023-12-04 ENCOUNTER — Ambulatory Visit: Payer: Medicare Other | Admitting: Physical Therapy

## 2023-12-04 DIAGNOSIS — M25562 Pain in left knee: Secondary | ICD-10-CM | POA: Diagnosis not present

## 2023-12-04 DIAGNOSIS — R252 Cramp and spasm: Secondary | ICD-10-CM | POA: Diagnosis not present

## 2023-12-04 DIAGNOSIS — M25561 Pain in right knee: Secondary | ICD-10-CM | POA: Diagnosis not present

## 2023-12-04 DIAGNOSIS — G8929 Other chronic pain: Secondary | ICD-10-CM

## 2023-12-04 DIAGNOSIS — R262 Difficulty in walking, not elsewhere classified: Secondary | ICD-10-CM

## 2023-12-04 DIAGNOSIS — M170 Bilateral primary osteoarthritis of knee: Secondary | ICD-10-CM | POA: Diagnosis not present

## 2023-12-04 DIAGNOSIS — M6281 Muscle weakness (generalized): Secondary | ICD-10-CM

## 2023-12-04 DIAGNOSIS — R293 Abnormal posture: Secondary | ICD-10-CM | POA: Diagnosis not present

## 2023-12-12 ENCOUNTER — Ambulatory Visit: Payer: Medicare Other

## 2023-12-12 DIAGNOSIS — G8929 Other chronic pain: Secondary | ICD-10-CM | POA: Diagnosis not present

## 2023-12-12 DIAGNOSIS — R262 Difficulty in walking, not elsewhere classified: Secondary | ICD-10-CM | POA: Diagnosis not present

## 2023-12-12 DIAGNOSIS — M170 Bilateral primary osteoarthritis of knee: Secondary | ICD-10-CM

## 2023-12-12 DIAGNOSIS — M25561 Pain in right knee: Secondary | ICD-10-CM | POA: Diagnosis not present

## 2023-12-12 DIAGNOSIS — R293 Abnormal posture: Secondary | ICD-10-CM | POA: Diagnosis not present

## 2023-12-12 DIAGNOSIS — M6281 Muscle weakness (generalized): Secondary | ICD-10-CM | POA: Diagnosis not present

## 2023-12-12 DIAGNOSIS — R252 Cramp and spasm: Secondary | ICD-10-CM

## 2023-12-12 DIAGNOSIS — M25562 Pain in left knee: Secondary | ICD-10-CM | POA: Diagnosis not present

## 2023-12-12 NOTE — Therapy (Unsigned)
 OUTPATIENT PHYSICAL THERAPY LOWER EXTREMITY TREATMENT   Patient Name: Abigail [name] MRN: [id number] DOB:June 27, 1941, 83 y.o., female Today's Date: 12/13/2023  END OF SESSION:  PT End of Session - 12/12/23 1443     Visit Number 9    Date for PT Re-Evaluation 01/03/24    Authorization Type [employer]    Progress Note Due on Visit 10    PT Start Time 1444    PT Stop Time 1525    PT Time Calculation (min) 41 min    Activity Tolerance Patient tolerated treatment well    Behavior During Therapy WFL for tasks assessed/performed                 Past Medical History:  Diagnosis Date   Arthritis    oa   Baker's cyst    behind left knee   Constipation    Hemorrhoids    Hypertension    MVP (mitral valve prolapse)    Neoplasm    of appendix   Rosacea    Past Surgical History:  Procedure Laterality Date   AUGMENTATION MAMMAPLASTY Bilateral 1990   saline   CARDIAC CATHETERIZATION  17years ago   small amount of blockage   CATRACTS Bilateral    COLONOSCOPY  2006 and oct 2018   ILEOCECETOMY N/A 08/17/2017   Procedure: OPEN ILEOCECETOMY;  Surgeon: Darnell Level, MD;  Location: [facility];  Service: General;  Laterality: N/A;  [facility]   Patient Active Problem List   Diagnosis Date Noted   Degenerative disc disease, lumbar 02/15/2023   Osteoporosis 09/22/2021   Greater trochanteric bursitis of left hip 11/04/2020   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   Gastroesophageal reflux disease 06/29/2017   Hypercholesterolemia 06/29/2017   Hypertensive disorder 06/29/2017   Mitral valve disorder 06/29/2017   Tear of LCL (lateral collateral ligament) of knee, left, subsequent encounter 08/09/2016   Degenerative arthritis of left knee 07/06/2016   Degenerative arthritis of right knee 02/25/2016   Piriformis syndrome of right side 06/24/2015   Primary localized osteoarthrosis, lower leg 06/13/2014   Baker's cyst of knee 05/20/2014    PCP: Pcp,  No   REFERRING PROVIDER: Judi Saa, DO  REFERRING DIAG: M25.561,M25.562,G89.29 (ICD-10-CM) - Chronic pain of both knees M71.22 (ICD-10-CM) - Synovial cyst of left knee M17.11 (ICD-10-CM) - Primary osteoarthritis of right knee M17.12 (ICD-10-CM) - Primary osteoarthritis of left knee  THERAPY DIAG:  Chronic pain of both knees  Difficulty in walking, not elsewhere classified  Muscle weakness (generalized)  Cramp and spasm  Abnormal posture  Primary osteoarthritis of both knees  Rationale for Evaluation and Treatment: Rehabilitation  ONSET DATE: 10/31/2023  SUBJECTIVE:   SUBJECTIVE STATEMENT: Patient reports left hip pain has worsened.  She rates this 4/10.  She has trouble assigning a number on her pain due to multiple areas of pain.  She speaks of the knees (anterior, lateral and posterior areas of pain), left hip pain (lateral and in the groin), and back pain.    From initial eval: Patient reports she has been see [name] for about 5 years for her knees.  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 does not feel like she needs TKA just yet.  She states she also has some medial joint line pain on the right but that her left knee is worse than her right.  She has frequent Baker's cysts on both knees and chronic pain.  She  also reports fat pad irritation issues.  She has been having aspiration and cortisone injections about every 3 months to control the symptoms.  However, she is fairly active and has very little pain for about 2 1/2 months then she will have some return of pain just before her next scheduled aspiration and injection.  She is retired and feels she is fairly active. She hopes to avoid TKA's in the near future and wants to gain her muscle back.    PERTINENT HISTORY: 10/31/23 both knees aspirated, [name] confirms end stage OA both knees.  PT for now.  F/U with [name] in 6-8 weeks PAIN:  Are you having pain? Yes: NPRS scale: 8/10 in L  thigh with stairs Pain location: bilateral knees Pain description: aching Aggravating factors: stairs, bending, stooping and squatting Relieving factors: rest, ice, aspiration and cortisone  PRECAUTIONS: None  RED FLAGS: None   WEIGHT BEARING RESTRICTIONS: No  FALLS:  Has patient fallen in last 6 months? No  LIVING ENVIRONMENT: Lives with: lives with their family Lives in: House/apartment Stairs: Yes: Internal: 12 steps; on right going up and External: 4 steps; on right going up Has following equipment at home:  none  (does not need any yet)  OCCUPATION: retired  PLOF: Independent, Independent with basic ADLs, Independent with household mobility without device, Independent with community mobility without device, Independent with homemaking with ambulation, Independent with gait, and Independent with transfers  PATIENT GOALS: to prolong need for TKA's  NEXT MD VISIT: 6-8 weeks from 10/31/23  OBJECTIVE:  Note: Objective measures were completed at Evaluation unless otherwise noted.  DIAGNOSTIC FINDINGS: xrays reveal end stage OA  PATIENT SURVEYS:  LEFS 64/80= 80%  COGNITION: Overall cognitive status: Within functional limits for tasks assessed     SENSATION: WFL  EDEMA:  Circumferential: mid patella (aspirated last on 10/31/23;  right:  34.3 cm    , Left:  35 cm     MUSCLE LENGTH: Hamstrings: Right 50 deg; Left 50 deg Thomas test: Right pos deg; Left pos deg  POSTURE: rounded shoulders and severe scoliosis   PALPATION: Severe crepitus bilateral PF and joint line  LOWER EXTREMITY ROM:  WFL  LOWER EXTREMITY MMT:  MMT Right eval Left eval  Hip flexion 3+ 3+  Hip extension 3+ 3+  Hip abduction 4- 4-  Hip adduction 4+ 4+  Hip internal rotation    Hip external rotation    Knee flexion 4- 4-  Knee extension 4- 4-  Ankle dorsiflexion    Ankle plantarflexion    Ankle inversion    Ankle eversion     (Blank rows = not tested)  LOWER EXTREMITY SPECIAL  TESTS:  Knee special tests: Patellafemoral grind test: positive , Step up/down test: positive , and Patella tap test (ballotable patella): negative  FUNCTIONAL TESTS:  5 times sit to stand: 14.94 sec Timed up and go (TUG): 10.98 sec  GAIT: Distance walked: 30 Assistive device utilized: None Level of assistance: Modified independence Comments: antalgic  TREATMENT DATE: 12/12/23  Nustep x 8 min (PT present to discuss status) Lengthy discussion regarding patients multiple areas of pain and kinetic chain effect of her asymmetries.  Explained that she will likely always be battling joint and soft tissue areas of pain without doing anything different.  Options of stretching, strengthening, DN, modalities, meds and surgery were discussed.  Explained that when all of the conservative measures have been attempted and her pain is interfering with her quality of life, this is a point where she must decide on whether to live with the pain and manage as best she can with exercise or do surgery.     Use of visual aides to educate patient on anatomy and biomechanics of the lower extremities.  Patient repeatedly states she doesn't understand why she is hurting.  Her knees are known end stage OA, she likely has OA of the left hip along with scoliosis.  This was all explained to her in detail.   Seated short side stretch for scoliosis (overhead trunk stretch to left) x 10 holding 5 sec each Patient then inquired about why we were stretching this side, she thought she needed to stretch the opposite side: Used visual and verbal cues to explain that her shortened side was on the right.  She was having difficulty understanding as her hip height was also in question.   Seated clam x 20 with yellow loop Supine clam x 20 with yellow loop Side lying clam bilateral 2 x 10 each LE    12/04/23   Discussed status and that she feels exercises are making her L leg pain worse, discussed bursitis with visual aides provided, assessed posture (even pelvic landmarks, left lateral shift and scoliosis) Hip matrix: abd and extension 1 x 10 each LE with 25# Step down  2" 10  and 4"  x 10 B - pain in the left knee at end of second set. Hip hike B x 10 on stairs Lateral glide B x 5 feels best on L Doorway QL stretch B Side lumbar stretch B with pillow under waist and top leg hanging bwd off EOB   11/29/23  Nustep L 3 x 5 min  Hip matrix: abd and extension 2 x 10 each LE with 25# Step downs 4" x 10 each LE: (left LE quite painful but completed 10 reps) Rocker board x 2 min  Right side lying IT band rolling x 1 min Supine HS and IT band stretch 3 x 30 sec ea B Supine SAQ with blue foam roller  2 x 10 with 3 lbs  Supine SAQ with larger blue bolster 2 x 10 with 3 lbs Supine SLR with 3 lbs 2 x 10    11/08/23  Initial eval completed and initiated HEP   PATIENT EDUCATION:  Education details: Initiated HEP Person educated: Patient Education method: Programmer, multimedia, Facilities manager, Verbal cues, and Handouts Education comprehension: verbalized understanding, returned demonstration, and verbal cues required  HOME EXERCISE PROGRAM: Access Code: [email or web address] URL: [email or web address] Date: 12/04/2023 Prepared by: Raynelle Fanning  Exercises - Sidelying Thoracic Rotation with Open Book  - 1 x daily - 7 x weekly - 1-2 sets - 10 reps - Seated Cervical Retraction  - 1 x daily - 7 x weekly - 2 sets - 10 reps - Shoulder External Rotation and Scapular Retraction with Resistance  - 1 x daily - 7 x weekly - 2 sets - 10 reps - Standing Shoulder Horizontal Abduction with Resistance  - 1 x daily - 7 x  weekly - 2 sets - 10 reps - Standing Shoulder Row with Anchored Resistance  - 1 x daily - 7 x weekly - 2 sets - 10 reps - Shoulder extension with resistance - Neutral  - 1 x daily - 7 x weekly - 2 sets - 10  reps - Cat Cow  - 1 x daily - 7 x weekly - 2 sets - 10 reps - Quadruped Full Range Thoracic Rotation with Reach  - 2 x daily - 7 x weekly - 2 sets - 10 reps - Child's Pose Stretch  - 1 x daily - 7 x weekly - 1 sets - 3 reps - 20-30 sec hold - Left Standing Lateral Shift Correction at Wall - Hold  - 2 x daily - 7 x weekly - 1 sets - 3 reps - 20-30 sec hold - Standing Quadratus Lumborum Stretch with Doorway  - 2 x daily - 7 x weekly - 1 sets - 2 reps - 30 sec hold - Sidelying ITB and lumbar stretch (Mirrored)  - 2 x daily - 7 x weekly - 1 sets - 3 reps - 30-60 seconds hold  ASSESSMENT:  CLINICAL IMPRESSION: Patient is progressing slowly.  She has know end stage OA in both knees with frequent Bakers' cysts that are drained every few months.  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  She has low tolerance for any exercises or stretching as this seems to result in another area of pain due to multiple joint and soft tissue abnormalities.  We will continue current POC for several more visits to assess whether conservative therapy is appropriate for this patient.  She may do well in aquatic program for a short time if land therapy is not progressing.    OBJECTIVE IMPAIRMENTS: Abnormal gait, decreased balance, decreased mobility, difficulty walking, decreased ROM, decreased strength, increased edema, increased fascial restrictions, increased muscle spasms, impaired flexibility, postural dysfunction, and pain.   ACTIVITY LIMITATIONS: carrying, lifting, bending, sitting, standing, squatting, sleeping, stairs, transfers, bed mobility, toileting, and caring for others  PARTICIPATION LIMITATIONS: meal prep, cleaning, laundry, driving, shopping, community activity, yard work, and church  PERSONAL FACTORS: Age, Fitness, Time since onset of injury/illness/exacerbation, and 1-2 comorbidities: Htn and mitral valve prolapse  are also affecting  patient's functional outcome.   REHAB POTENTIAL: Fair end stage OA  CLINICAL DECISION MAKING: Evolving/moderate complexity  EVALUATION COMPLEXITY: Moderate   GOALS: Goals reviewed with patient? Yes  SHORT TERM GOALS: Target date: 12/06/2023  Pain report to be no greater than 4/10  Baseline: Goal status: In progress  2.  Patient will be independent with initial HEP  Baseline:  Goal status: In progress   LONG TERM GOALS: Target date: 01/03/2024   Patient to report pain no greater than 2/10  Baseline:  Goal status: INITIAL  2.  Patient to be independent with advanced HEP  Baseline:  Goal status: INITIAL  3.  Patient to be able to ascend and descend steps with pain no greater than 2/10  Baseline:  Goal status: INITIAL  4.  Patient to be able to bend, stoop and squat with pain no greater than 2/10  Baseline:  Goal status: INITIAL  5.  Patient to be able to stand or walk for at least 15 min without leg pain  Baseline:  Goal status: INITIAL  6.  Patient to report 85% improvement in overall symptoms  Baseline:  Goal status: INITIAL  PLAN:  PT FREQUENCY: 1-2x/week  PT DURATION: 8 weeks  PLANNED INTERVENTIONS: 97110-Therapeutic exercises, 97530- Therapeutic activity, [id number]- Neuromuscular re-education, 97535- Self Care, 21308- Manual therapy, [id number]- Gait training, [phone]- Aquatic Therapy, 97014- Electrical stimulation (unattended), [id number]- Electrical stimulation (manual), [id number]- Vasopneumatic device, [id number]- Ultrasound, [id number]- Ionotophoresis 4mg /ml Dexamethasone, Patient/Family education, Balance training, Stair training, Taping, Dry Needling, Joint mobilization, Compression bandaging, Vestibular training, Visual/preceptual remediation/compensation, DME instructions, Cryotherapy, and Moist heat  PLAN FOR NEXT SESSION: Continue current POC.  If patient not progressing, consider pool program.     Victorino Dike B. Patsye Sullivant, PT 12/13/23 8:54 AM [facility] [address], [address] [place], [place] [address] Phone # [phone] Fax [phone]

## 2023-12-19 ENCOUNTER — Ambulatory Visit: Payer: Medicare Other

## 2023-12-19 DIAGNOSIS — R262 Difficulty in walking, not elsewhere classified: Secondary | ICD-10-CM

## 2023-12-19 DIAGNOSIS — G8929 Other chronic pain: Secondary | ICD-10-CM | POA: Diagnosis not present

## 2023-12-19 DIAGNOSIS — M25562 Pain in left knee: Secondary | ICD-10-CM | POA: Diagnosis not present

## 2023-12-19 DIAGNOSIS — R252 Cramp and spasm: Secondary | ICD-10-CM

## 2023-12-19 DIAGNOSIS — M170 Bilateral primary osteoarthritis of knee: Secondary | ICD-10-CM | POA: Diagnosis not present

## 2023-12-19 DIAGNOSIS — M25561 Pain in right knee: Secondary | ICD-10-CM | POA: Diagnosis not present

## 2023-12-19 DIAGNOSIS — R293 Abnormal posture: Secondary | ICD-10-CM

## 2023-12-19 DIAGNOSIS — M6281 Muscle weakness (generalized): Secondary | ICD-10-CM | POA: Diagnosis not present

## 2023-12-19 NOTE — Therapy (Signed)
 OUTPATIENT PHYSICAL THERAPY LOWER EXTREMITY TREATMENT   Patient Name: Abigail [name] MRN: [id number] DOB:09/13/1941, 83 y.o., female Today's Date: 12/19/2023  END OF SESSION:  PT End of Session - 12/19/23 1233     Visit Number 10    Date for PT Re-Evaluation 01/03/24    Authorization Type [employer]    Progress Note Due on Visit 10    PT Start Time 1232    PT Stop Time 1315    PT Time Calculation (min) 43 min    Activity Tolerance Patient tolerated treatment well    Behavior During Therapy WFL for tasks assessed/performed                 Past Medical History:  Diagnosis Date   Arthritis    oa   Baker's cyst    behind left knee   Constipation    Hemorrhoids    Hypertension    MVP (mitral valve prolapse)    Neoplasm    of appendix   Rosacea    Past Surgical History:  Procedure Laterality Date   AUGMENTATION MAMMAPLASTY Bilateral 1990   saline   CARDIAC CATHETERIZATION  17years ago   small amount of blockage   CATRACTS Bilateral    COLONOSCOPY  2006 and oct 2018   ILEOCECETOMY N/A 08/17/2017   Procedure: OPEN ILEOCECETOMY;  Surgeon: Darnell Level, MD;  Location: [facility];  Service: General;  Laterality: N/A;  [facility]   Patient Active Problem List   Diagnosis Date Noted   Degenerative disc disease, lumbar 02/15/2023   Osteoporosis 09/22/2021   Greater trochanteric bursitis of left hip 11/04/2020   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   Gastroesophageal reflux disease 06/29/2017   Hypercholesterolemia 06/29/2017   Hypertensive disorder 06/29/2017   Mitral valve disorder 06/29/2017   Tear of LCL (lateral collateral ligament) of knee, left, subsequent encounter 08/09/2016   Degenerative arthritis of left knee 07/06/2016   Degenerative arthritis of right knee 02/25/2016   Piriformis syndrome of right side 06/24/2015   Primary localized osteoarthrosis, lower leg 06/13/2014   Baker's cyst of knee 05/20/2014    PCP: Pcp,  No   REFERRING PROVIDER: Judi Saa, DO  REFERRING DIAG: M25.561,M25.562,G89.29 (ICD-10-CM) - Chronic pain of both knees M71.22 (ICD-10-CM) - Synovial cyst of left knee M17.11 (ICD-10-CM) - Primary osteoarthritis of right knee M17.12 (ICD-10-CM) - Primary osteoarthritis of left knee  THERAPY DIAG:  Abnormal posture  Cramp and spasm  Difficulty in walking, not elsewhere classified  Muscle weakness (generalized)  Chronic pain of both knees  Primary osteoarthritis of both knees  Rationale for Evaluation and Treatment: Rehabilitation  ONSET DATE: 10/31/2023  SUBJECTIVE:   SUBJECTIVE STATEMENT: Patient reports left hip pain persists.  "The Baker's cyst has gotten bigger again.  I was going to make an appt with Ian Malkin to see if he could drain it again"  "My granddaughter's wedding is this week and I have been helping my daughter run errands and get stuff together"  Patient admits she has been on her feet a lot and is very tired.  She requests to keep her visit simple today since she has her granddaughter's wedding this weekend.    From initial eval: Patient reports she has been see [name] for about 5 years for her knees.  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 does not feel like she needs TKA just yet.  She states she also has  some medial joint line pain on the right but that her left knee is worse than her right.  She has frequent Baker's cysts on both knees and chronic pain.  She also reports fat pad irritation issues.  She has been having aspiration and cortisone injections about every 3 months to control the symptoms.  However, she is fairly active and has very little pain for about 2 1/2 months then she will have some return of pain just before her next scheduled aspiration and injection.  She is retired and feels she is fairly active. She hopes to avoid TKA's in the near future and wants to gain her muscle back.    PERTINENT HISTORY: 10/31/23 both  knees aspirated, [name] confirms end stage OA both knees.  PT for now.  F/U with [name] in 6-8 weeks PAIN:   12/19/23 Are you having pain? Yes: NPRS scale: 8/10 in L thigh  Pain location: bilateral knees Pain description: aching Aggravating factors: stairs, bending, stooping and squatting Relieving factors: rest, ice, aspiration and cortisone  PRECAUTIONS: None  RED FLAGS: None   WEIGHT BEARING RESTRICTIONS: No  FALLS:  Has patient fallen in last 6 months? No  LIVING ENVIRONMENT: Lives with: lives with their family Lives in: House/apartment Stairs: Yes: Internal: 12 steps; on right going up and External: 4 steps; on right going up Has following equipment at home:  none  (does not need any yet)  OCCUPATION: retired  PLOF: Independent, Independent with basic ADLs, Independent with household mobility without device, Independent with community mobility without device, Independent with homemaking with ambulation, Independent with gait, and Independent with transfers  PATIENT GOALS: to prolong need for TKA's  NEXT MD VISIT: 6-8 weeks from 10/31/23  OBJECTIVE:  Note: Objective measures were completed at Evaluation unless otherwise noted.  DIAGNOSTIC FINDINGS: xrays reveal end stage OA  PATIENT SURVEYS:  LEFS 64/80= 80%  COGNITION: Overall cognitive status: Within functional limits for tasks assessed     SENSATION: WFL  EDEMA:  Circumferential: mid patella (aspirated last on 10/31/23;  right:  34.3 cm    , Left:  35 cm    Measure next visit MUSCLE LENGTH: Hamstrings: Right 50 deg; Left 50 deg Thomas test: Right pos deg; Left pos deg  POSTURE: rounded shoulders and severe scoliosis   PALPATION: Severe crepitus bilateral PF and joint line  LOWER EXTREMITY ROM:  WFL  LOWER EXTREMITY MMT:  MMT Right eval Right 12/19/23 Left eval Left 12/19/23  Hip flexion 3+ 4+ 3+ 4-  Hip extension 3+ 4- 3+ 4-  Hip abduction 4- 4 4- 4  Hip adduction 4+ 4+ 4+ 4+  Hip  internal rotation      Hip external rotation      Knee flexion 4- 4 4- 4-  Knee extension 4- 4 4- 4-  Ankle dorsiflexion      Ankle plantarflexion      Ankle inversion      Ankle eversion       (Blank rows = not tested)  LOWER EXTREMITY SPECIAL TESTS:  Knee special tests: Patellafemoral grind test: positive , Step up/down test: positive , and Patella tap test (ballotable patella): negative  FUNCTIONAL TESTS:  Initial eval:  5 times sit to stand: 14.94 sec Timed up and go (TUG): 10.98 sec  12/19/2023: 5 times sit to stand: 16.40 sec sec Timed up and go (TUG): 11.01 sec  GAIT: Distance walked: 30 Assistive device utilized: None Level of assistance: Modified independence Comments: antalgic  TREATMENT DATE: 12/19/23  10th visit Re-assessment completed Moist heat to left hip and lateral thigh in right side lying x 12 min with IFC e-stim  12/12/23  Nustep x 8 min (PT present to discuss status) Lengthy discussion regarding patients multiple areas of pain and kinetic chain effect of her asymmetries.  Explained that she will likely always be battling joint and soft tissue areas of pain without doing anything different.  Options of stretching, strengthening, DN, modalities, meds and surgery were discussed.  Explained that when all of the conservative measures have been attempted and her pain is interfering with her quality of life, this is a point where she must decide on whether to live with the pain and manage as best she can with exercise or do surgery.     Use of visual aides to educate patient on anatomy and biomechanics of the lower extremities.  Patient repeatedly states she doesn't understand why she is hurting.  Her knees are known end stage OA, she likely has OA of the left hip along with scoliosis.  This was all explained to her in detail.   Seated short  side stretch for scoliosis (overhead trunk stretch to left) x 10 holding 5 sec each Patient then inquired about why we were stretching this side, she thought she needed to stretch the opposite side: Used visual and verbal cues to explain that her shortened side was on the right.  She was having difficulty understanding as her hip height was also in question.   Seated clam x 20 with yellow loop Supine clam x 20 with yellow loop Side lying clam bilateral 2 x 10 each LE    12/04/23  Discussed status and that she feels exercises are making her L leg pain worse, discussed bursitis with visual aides provided, assessed posture (even pelvic landmarks, left lateral shift and scoliosis) Hip matrix: abd and extension 1 x 10 each LE with 25# Step down  2" 10  and 4"  x 10 B - pain in the left knee at end of second set. Hip hike B x 10 on stairs Lateral glide B x 5 feels best on L Doorway QL stretch B Side lumbar stretch B with pillow under waist and top leg hanging bwd off EOB   11/08/23  Initial eval completed and initiated HEP   PATIENT EDUCATION:  Education details: Initiated HEP Person educated: Patient Education method: Programmer, multimedia, Facilities manager, Verbal cues, and Handouts Education comprehension: verbalized understanding, returned demonstration, and verbal cues required  HOME EXERCISE PROGRAM: Access Code: [email or web address] URL: [email or web address] Date: 12/04/2023 Prepared by: Raynelle Fanning  Exercises - Sidelying Thoracic Rotation with Open Book  - 1 x daily - 7 x weekly - 1-2 sets - 10 reps - Seated Cervical Retraction  - 1 x daily - 7 x weekly - 2 sets - 10 reps - Shoulder External Rotation and Scapular Retraction with Resistance  - 1 x daily - 7 x weekly - 2 sets - 10 reps - Standing Shoulder Horizontal Abduction with Resistance  - 1 x daily - 7 x weekly - 2 sets - 10 reps - Standing Shoulder Row with Anchored Resistance  - 1 x daily - 7 x weekly - 2 sets - 10 reps - Shoulder extension  with resistance - Neutral  - 1 x daily - 7 x weekly - 2 sets - 10 reps - Cat Cow  - 1 x daily - 7 x weekly - 2 sets - 10 reps - Quadruped Full Range Thoracic  Rotation with Reach  - 2 x daily - 7 x weekly - 2 sets - 10 reps - Child's Pose Stretch  - 1 x daily - 7 x weekly - 1 sets - 3 reps - 20-30 sec hold - Left Standing Lateral Shift Correction at Wall - Hold  - 2 x daily - 7 x weekly - 1 sets - 3 reps - 20-30 sec hold - Standing Quadratus Lumborum Stretch with Doorway  - 2 x daily - 7 x weekly - 1 sets - 2 reps - 30 sec hold - Sidelying ITB and lumbar stretch (Mirrored)  - 2 x daily - 7 x weekly - 1 sets - 3 reps - 30-60 seconds hold  ASSESSMENT:  CLINICAL IMPRESSION: Patient continues to progress slowly.   She continues to have low tolerance for any exercises or stretching as this seems to result in another area of pain due to multiple joint and soft tissue abnormalities.  We will continue current POC for several more visits to assess whether conservative therapy is appropriate for this patient.  We are still considering aquatic program for a short time if land therapy is not progressing.  She will not be able to attend her second visit this week due to the upcoming wedding this weekend.  She will resume next week.   OBJECTIVE IMPAIRMENTS: Abnormal gait, decreased balance, decreased mobility, difficulty walking, decreased ROM, decreased strength, increased edema, increased fascial restrictions, increased muscle spasms, impaired flexibility, postural dysfunction, and pain.   ACTIVITY LIMITATIONS: carrying, lifting, bending, sitting, standing, squatting, sleeping, stairs, transfers, bed mobility, toileting, and caring for others  PARTICIPATION LIMITATIONS: meal prep, cleaning, laundry, driving, shopping, community activity, yard work, and church  PERSONAL FACTORS: Age, Fitness, Time since onset of injury/illness/exacerbation, and 1-2 comorbidities: Htn and mitral valve prolapse  are also  affecting patient's functional outcome.   REHAB POTENTIAL: Fair end stage OA  CLINICAL DECISION MAKING: Evolving/moderate complexity  EVALUATION COMPLEXITY: Moderate   GOALS: Goals reviewed with patient? Yes  SHORT TERM GOALS: Target date: 12/06/2023  Pain report to be no greater than 4/10  Baseline: Goal status: In progress  2.  Patient will be independent with initial HEP  Baseline:  Goal status: In progress   LONG TERM GOALS: Target date: 01/03/2024   Patient to report pain no greater than 2/10  Baseline:  Goal status: INITIAL  2.  Patient to be independent with advanced HEP  Baseline:  Goal status: INITIAL  3.  Patient to be able to ascend and descend steps with pain no greater than 2/10  Baseline:  Goal status: INITIAL  4.  Patient to be able to bend, stoop and squat with pain no greater than 2/10  Baseline:  Goal status: INITIAL  5.  Patient to be able to stand or walk for at least 15 min without leg pain  Baseline:  Goal status: INITIAL  6.  Patient to report 85% improvement in overall symptoms  Baseline:  Goal status: INITIAL   PLAN:  PT FREQUENCY: 1-2x/week  PT DURATION: 8 weeks  PLANNED INTERVENTIONS: 97110-Therapeutic exercises, 97530- Therapeutic activity, [id number]- Neuromuscular re-education, 97535- Self Care, 16109- Manual therapy, [id number]- Gait training, [phone]- Aquatic Therapy, 97014- Electrical stimulation (unattended), [id number]- Electrical stimulation (manual), [id number]- Vasopneumatic device, [id number]- Ultrasound, [id number]- Ionotophoresis 4mg /ml Dexamethasone, Patient/Family education, Balance training, Stair training, Taping, Dry Needling, Joint mobilization, Compression bandaging, Vestibular training, Visual/preceptual remediation/compensation, DME instructions, Cryotherapy, and Moist heat  PLAN FOR NEXT SESSION: Continue current POC.  If patient not progressing,  consider pool program.     Victorino Dike B. Orville Widmann, PT 12/19/23 7:31 PM [facility] [address] [place], [place] [address] Phone # [phone] Fax [phone]

## 2023-12-26 ENCOUNTER — Ambulatory Visit: Payer: Medicare Other | Attending: Family Medicine

## 2023-12-26 DIAGNOSIS — R293 Abnormal posture: Secondary | ICD-10-CM | POA: Diagnosis not present

## 2023-12-26 DIAGNOSIS — M25562 Pain in left knee: Secondary | ICD-10-CM | POA: Insufficient documentation

## 2023-12-26 DIAGNOSIS — R252 Cramp and spasm: Secondary | ICD-10-CM | POA: Diagnosis not present

## 2023-12-26 DIAGNOSIS — M170 Bilateral primary osteoarthritis of knee: Secondary | ICD-10-CM | POA: Insufficient documentation

## 2023-12-26 DIAGNOSIS — M6281 Muscle weakness (generalized): Secondary | ICD-10-CM | POA: Diagnosis not present

## 2023-12-26 DIAGNOSIS — R262 Difficulty in walking, not elsewhere classified: Secondary | ICD-10-CM | POA: Diagnosis not present

## 2023-12-26 DIAGNOSIS — G8929 Other chronic pain: Secondary | ICD-10-CM | POA: Diagnosis not present

## 2023-12-26 DIAGNOSIS — M25561 Pain in right knee: Secondary | ICD-10-CM | POA: Diagnosis not present

## 2023-12-26 NOTE — Therapy (Unsigned)
 OUTPATIENT PHYSICAL THERAPY LOWER EXTREMITY TREATMENT   Patient Name: Abigail [name] MRN: [id number] DOB:1941/07/19, 83 y.o., female Today's Date: 12/27/2023  END OF SESSION:  PT End of Session - 12/26/23 1501     Visit Number 11    Date for PT Re-Evaluation 01/03/24    Authorization Type [employer]    Progress Note Due on Visit 10    PT Start Time 1445    PT Stop Time 1530    PT Time Calculation (min) 45 min    Activity Tolerance Patient tolerated treatment well    Behavior During Therapy WFL for tasks assessed/performed                 Past Medical History:  Diagnosis Date   Arthritis    oa   Baker's cyst    behind left knee   Constipation    Hemorrhoids    Hypertension    MVP (mitral valve prolapse)    Neoplasm    of appendix   Rosacea    Past Surgical History:  Procedure Laterality Date   AUGMENTATION MAMMAPLASTY Bilateral 1990   saline   CARDIAC CATHETERIZATION  17years ago   small amount of blockage   CATRACTS Bilateral    COLONOSCOPY  2006 and oct 2018   ILEOCECETOMY N/A 08/17/2017   Procedure: OPEN ILEOCECETOMY;  Surgeon: Darnell Level, MD;  Location: [facility];  Service: General;  Laterality: N/A;  [facility]   Patient Active Problem List   Diagnosis Date Noted   Degenerative disc disease, lumbar 02/15/2023   Osteoporosis 09/22/2021   Greater trochanteric bursitis of left hip 11/04/2020   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   Gastroesophageal reflux disease 06/29/2017   Hypercholesterolemia 06/29/2017   Hypertensive disorder 06/29/2017   Mitral valve disorder 06/29/2017   Tear of LCL (lateral collateral ligament) of knee, left, subsequent encounter 08/09/2016   Degenerative arthritis of left knee 07/06/2016   Degenerative arthritis of right knee 02/25/2016   Piriformis syndrome of right side 06/24/2015   Primary localized osteoarthrosis, lower leg 06/13/2014   Baker's cyst of knee 05/20/2014    PCP: Pcp,  No   REFERRING PROVIDER: Judi Saa, DO  REFERRING DIAG: M25.561,M25.562,G89.29 (ICD-10-CM) - Chronic pain of both knees M71.22 (ICD-10-CM) - Synovial cyst of left knee M17.11 (ICD-10-CM) - Primary osteoarthritis of right knee M17.12 (ICD-10-CM) - Primary osteoarthritis of left knee  THERAPY DIAG:  Abnormal posture  Cramp and spasm  Difficulty in walking, not elsewhere classified  Muscle weakness (generalized)  Chronic pain of both knees  Primary osteoarthritis of both knees  Rationale for Evaluation and Treatment: Rehabilitation  ONSET DATE: 10/31/2023  SUBJECTIVE:   SUBJECTIVE STATEMENT: Patient reports she is doing pretty good since the wedding.  She was able to wear her short heels and didn't have any issues during the wedding.   I have found that if I go down steps sideways, I don't hurt as bad.    From initial eval: Patient reports she has been see [name] for about 5 years for her knees.  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 does not feel like she needs TKA just yet.  She states she also has some medial joint line pain on the right but that her left knee is worse than her right.  She has frequent Baker's cysts on both knees and chronic pain.  She also reports fat pad irritation issues.  She  has been having aspiration and cortisone injections about every 3 months to control the symptoms.  However, she is fairly active and has very little pain for about 2 1/2 months then she will have some return of pain just before her next scheduled aspiration and injection.  She is retired and feels she is fairly active. She hopes to avoid TKA's in the near future and wants to gain her muscle back.    PERTINENT HISTORY: 10/31/23 both knees aspirated, [name] confirms end stage OA both knees.  PT for now.  F/U with [name] in 6-8 weeks PAIN:   12/19/23 Are you having pain? Yes: NPRS scale: 8/10 in L thigh  Pain location: bilateral knees Pain  description: aching Aggravating factors: stairs, bending, stooping and squatting Relieving factors: rest, ice, aspiration and cortisone  PRECAUTIONS: None  RED FLAGS: None   WEIGHT BEARING RESTRICTIONS: No  FALLS:  Has patient fallen in last 6 months? No  LIVING ENVIRONMENT: Lives with: lives with their family Lives in: House/apartment Stairs: Yes: Internal: 12 steps; on right going up and External: 4 steps; on right going up Has following equipment at home:  none  (does not need any yet)  OCCUPATION: retired  PLOF: Independent, Independent with basic ADLs, Independent with household mobility without device, Independent with community mobility without device, Independent with homemaking with ambulation, Independent with gait, and Independent with transfers  PATIENT GOALS: to prolong need for TKA's  NEXT MD VISIT: 6-8 weeks from 10/31/23  OBJECTIVE:  Note: Objective measures were completed at Evaluation unless otherwise noted.  DIAGNOSTIC FINDINGS: xrays reveal end stage OA  PATIENT SURVEYS:  LEFS 64/80= 80%  COGNITION: Overall cognitive status: Within functional limits for tasks assessed     SENSATION: WFL  EDEMA:  Circumferential: mid patella (aspirated last on 10/31/23;  right:  34.3 cm    , Left:  35 cm    Measure next visit MUSCLE LENGTH: Hamstrings: Right 50 deg; Left 50 deg Thomas test: Right pos deg; Left pos deg  POSTURE: rounded shoulders and severe scoliosis   PALPATION: Severe crepitus bilateral PF and joint line  LOWER EXTREMITY ROM:  WFL  LOWER EXTREMITY MMT:  MMT Right eval Right 12/19/23 Left eval Left 12/19/23  Hip flexion 3+ 4+ 3+ 4-  Hip extension 3+ 4- 3+ 4-  Hip abduction 4- 4 4- 4  Hip adduction 4+ 4+ 4+ 4+  Hip internal rotation      Hip external rotation      Knee flexion 4- 4 4- 4-  Knee extension 4- 4 4- 4-  Ankle dorsiflexion      Ankle plantarflexion      Ankle inversion      Ankle eversion       (Blank rows = not  tested)  LOWER EXTREMITY SPECIAL TESTS:  Knee special tests: Patellafemoral grind test: positive , Step up/down test: positive , and Patella tap test (ballotable patella): negative  FUNCTIONAL TESTS:  Initial eval:  5 times sit to stand: 14.94 sec Timed up and go (TUG): 10.98 sec  12/19/2023: 5 times sit to stand: 16.40 sec sec Timed up and go (TUG): 11.01 sec  GAIT: Distance walked: 30 Assistive device utilized: None Level of assistance: Modified independence Comments: antalgic  TREATMENT DATE: 12/26/23  Nustep x 8 min (PT present to discuss status) Leg Press 50 lbs x 20 Seated ball squeeze x 20 (purple ball) Seated ball squeeze with LAQ 2 x 10 with 2 lb ankle weights Seated clam x 20 Lateral band walks with yellow loop x 3 laps Sit to stand 2 x 10 Moist heat with IFC estim to left hip and thigh x 12 min  12/19/23  10th visit Re-assessment completed Moist heat to left hip and lateral thigh in right side lying x 12 min with IFC e-stim  12/12/23  Nustep x 8 min (PT present to discuss status) Lengthy discussion regarding patients multiple areas of pain and kinetic chain effect of her asymmetries.  Explained that she will likely always be battling joint and soft tissue areas of pain without doing anything different.  Options of stretching, strengthening, DN, modalities, meds and surgery were discussed.  Explained that when all of the conservative measures have been attempted and her pain is interfering with her quality of life, this is a point where she must decide on whether to live with the pain and manage as best she can with exercise or do surgery.     Use of visual aides to educate patient on anatomy and biomechanics of the lower extremities.  Patient repeatedly states she doesn't understand why she is hurting.  Her knees are known end stage OA, she likely  has OA of the left hip along with scoliosis.  This was all explained to her in detail.   Seated short side stretch for scoliosis (overhead trunk stretch to left) x 10 holding 5 sec each Patient then inquired about why we were stretching this side, she thought she needed to stretch the opposite side: Used visual and verbal cues to explain that her shortened side was on the right.  She was having difficulty understanding as her hip height was also in question.   Seated clam x 20 with yellow loop Supine clam x 20 with yellow loop Side lying clam bilateral 2 x 10 each LE    12/04/23  Discussed status and that she feels exercises are making her L leg pain worse, discussed bursitis with visual aides provided, assessed posture (even pelvic landmarks, left lateral shift and scoliosis) Hip matrix: abd and extension 1 x 10 each LE with 25# Step down  2" 10  and 4"  x 10 B - pain in the left knee at end of second set. Hip hike B x 10 on stairs Lateral glide B x 5 feels best on L Doorway QL stretch B Side lumbar stretch B with pillow under waist and top leg hanging bwd off EOB   11/08/23  Initial eval completed and initiated HEP   PATIENT EDUCATION:  Education details: Initiated HEP Person educated: Patient Education method: Programmer, multimedia, Facilities manager, Verbal cues, and Handouts Education comprehension: verbalized understanding, returned demonstration, and verbal cues required  HOME EXERCISE PROGRAM: Access Code: [email or web address] URL: [email or web address] Date: 12/04/2023 Prepared by: Raynelle Fanning  Exercises - Sidelying Thoracic Rotation with Open Book  - 1 x daily - 7 x weekly - 1-2 sets - 10 reps - Seated Cervical Retraction  - 1 x daily - 7 x weekly - 2 sets - 10 reps - Shoulder External Rotation and Scapular Retraction with Resistance  - 1 x daily - 7 x weekly - 2 sets - 10 reps - Standing Shoulder Horizontal Abduction with Resistance  - 1 x daily - 7 x weekly - 2 sets -  10 reps - Standing  Shoulder Row with Anchored Resistance  - 1 x daily - 7 x weekly - 2 sets - 10 reps - Shoulder extension with resistance - Neutral  - 1 x daily - 7 x weekly - 2 sets - 10 reps - Cat Cow  - 1 x daily - 7 x weekly - 2 sets - 10 reps - Quadruped Full Range Thoracic Rotation with Reach  - 2 x daily - 7 x weekly - 2 sets - 10 reps - Child's Pose Stretch  - 1 x daily - 7 x weekly - 1 sets - 3 reps - 20-30 sec hold - Left Standing Lateral Shift Correction at Wall - Hold  - 2 x daily - 7 x weekly - 1 sets - 3 reps - 20-30 sec hold - Standing Quadratus Lumborum Stretch with Doorway  - 2 x daily - 7 x weekly - 1 sets - 2 reps - 30 sec hold - Sidelying ITB and lumbar stretch (Mirrored)  - 2 x daily - 7 x weekly - 1 sets - 3 reps - 30-60 seconds hold  ASSESSMENT:  CLINICAL IMPRESSION: Dareen seems to have done well during the wedding and had no set backs since being on her feet for a long period of time.  She continues to struggle with multiple soft tissue and joint issues but is not ready to consider surgery.  She plans to contact provider to drain the Baker's cyst again.  She was able to do leg press today and reported no increase in pain with today's activities.    OBJECTIVE IMPAIRMENTS: Abnormal gait, decreased balance, decreased mobility, difficulty walking, decreased ROM, decreased strength, increased edema, increased fascial restrictions, increased muscle spasms, impaired flexibility, postural dysfunction, and pain.   ACTIVITY LIMITATIONS: carrying, lifting, bending, sitting, standing, squatting, sleeping, stairs, transfers, bed mobility, toileting, and caring for others  PARTICIPATION LIMITATIONS: meal prep, cleaning, laundry, driving, shopping, community activity, yard work, and church  PERSONAL FACTORS: Age, Fitness, Time since onset of injury/illness/exacerbation, and 1-2 comorbidities: Htn and mitral valve prolapse  are also affecting patient's functional outcome.   REHAB POTENTIAL: Fair end stage  OA  CLINICAL DECISION MAKING: Evolving/moderate complexity  EVALUATION COMPLEXITY: Moderate   GOALS: Goals reviewed with patient? Yes  SHORT TERM GOALS: Target date: 12/06/2023  Pain report to be no greater than 4/10  Baseline: Goal status: In progress  2.  Patient will be independent with initial HEP  Baseline:  Goal status: In progress   LONG TERM GOALS: Target date: 01/03/2024   Patient to report pain no greater than 2/10  Baseline:  Goal status: In Progress  2.  Patient to be independent with advanced HEP  Baseline:  Goal status: In Progress  3.  Patient to be able to ascend and descend steps with pain no greater than 2/10  Baseline:  Goal status: In Progress  4.  Patient to be able to bend, stoop and squat with pain no greater than 2/10  Baseline:  Goal status: In Progress  5.  Patient to be able to stand or walk for at least 15 min without leg pain  Baseline:  Goal status: In Progress  6.  Patient to report 85% improvement in overall symptoms  Baseline:  Goal status: In Progress   PLAN:  PT FREQUENCY: 1-2x/week  PT DURATION: 8 weeks  PLANNED INTERVENTIONS: 97110-Therapeutic exercises, 97530- Therapeutic activity, [id number]- Neuromuscular re-education, 97535- Self Care, [id number]- Manual therapy, [id number]- Gait training, [id number]- Aquatic Therapy, 97014-  Electrical stimulation (unattended), [id number]- Electrical stimulation (manual), 86578- Vasopneumatic device, [id number]- Ultrasound, [id number]- Ionotophoresis 4mg /ml Dexamethasone, Patient/Family education, Balance training, Stair training, Taping, Dry Needling, Joint mobilization, Compression bandaging, Vestibular training, Visual/preceptual remediation/compensation, DME instructions, Cryotherapy, and Moist heat  PLAN FOR NEXT SESSION: Continue current POC.  If patient not progressing, consider pool program.     Victorino Dike B. Dakhari Zuver, PT 12/27/23 8:23 AM [facility] [address], [address] [place], [place] [address] Phone # [phone] Fax [phone]

## 2023-12-28 ENCOUNTER — Ambulatory Visit: Payer: Medicare Other

## 2023-12-28 DIAGNOSIS — G8929 Other chronic pain: Secondary | ICD-10-CM

## 2023-12-28 DIAGNOSIS — M25562 Pain in left knee: Secondary | ICD-10-CM | POA: Diagnosis not present

## 2023-12-28 DIAGNOSIS — R293 Abnormal posture: Secondary | ICD-10-CM | POA: Diagnosis not present

## 2023-12-28 DIAGNOSIS — R252 Cramp and spasm: Secondary | ICD-10-CM

## 2023-12-28 DIAGNOSIS — M170 Bilateral primary osteoarthritis of knee: Secondary | ICD-10-CM | POA: Diagnosis not present

## 2023-12-28 DIAGNOSIS — R262 Difficulty in walking, not elsewhere classified: Secondary | ICD-10-CM

## 2023-12-28 DIAGNOSIS — M6281 Muscle weakness (generalized): Secondary | ICD-10-CM

## 2023-12-28 DIAGNOSIS — M25561 Pain in right knee: Secondary | ICD-10-CM | POA: Diagnosis not present

## 2023-12-28 NOTE — Therapy (Signed)
 OUTPATIENT PHYSICAL THERAPY LOWER EXTREMITY TREATMENT   Patient Name: Abigail [name] MRN: [id number] DOB:07-10-1941, 83 y.o., female Today's Date: 12/28/2023  END OF SESSION:  PT End of Session - 12/28/23 1450     Visit Number 12    Date for PT Re-Evaluation 01/03/24    Authorization Type [employer]    Progress Note Due on Visit 10    PT Start Time 1447    PT Stop Time 1530    PT Time Calculation (min) 43 min    Activity Tolerance Patient tolerated treatment well    Behavior During Therapy WFL for tasks assessed/performed                 Past Medical History:  Diagnosis Date   Arthritis    oa   Baker's cyst    behind left knee   Constipation    Hemorrhoids    Hypertension    MVP (mitral valve prolapse)    Neoplasm    of appendix   Rosacea    Past Surgical History:  Procedure Laterality Date   AUGMENTATION MAMMAPLASTY Bilateral 1990   saline   CARDIAC CATHETERIZATION  17years ago   small amount of blockage   CATRACTS Bilateral    COLONOSCOPY  2006 and oct 2018   ILEOCECETOMY N/A 08/17/2017   Procedure: OPEN ILEOCECETOMY;  Surgeon: Darnell Level, MD;  Location: [facility];  Service: General;  Laterality: N/A;  [facility]   Patient Active Problem List   Diagnosis Date Noted   Degenerative disc disease, lumbar 02/15/2023   Osteoporosis 09/22/2021   Greater trochanteric bursitis of left hip 11/04/2020   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   Gastroesophageal reflux disease 06/29/2017   Hypercholesterolemia 06/29/2017   Hypertensive disorder 06/29/2017   Mitral valve disorder 06/29/2017   Tear of LCL (lateral collateral ligament) of knee, left, subsequent encounter 08/09/2016   Degenerative arthritis of left knee 07/06/2016   Degenerative arthritis of right knee 02/25/2016   Piriformis syndrome of right side 06/24/2015   Primary localized osteoarthrosis, lower leg 06/13/2014   Baker's cyst of knee 05/20/2014    PCP: Pcp,  No   REFERRING PROVIDER: Judi Saa, DO  REFERRING DIAG: M25.561,M25.562,G89.29 (ICD-10-CM) - Chronic pain of both knees M71.22 (ICD-10-CM) - Synovial cyst of left knee M17.11 (ICD-10-CM) - Primary osteoarthritis of right knee M17.12 (ICD-10-CM) - Primary osteoarthritis of left knee  THERAPY DIAG:  Primary osteoarthritis of both knees  Chronic pain of both knees  Abnormal posture  Cramp and spasm  Difficulty in walking, not elsewhere classified  Muscle weakness (generalized)  Rationale for Evaluation and Treatment: Rehabilitation  ONSET DATE: 10/31/2023  SUBJECTIVE:   SUBJECTIVE STATEMENT: Patient reports she is doing pretty good since the wedding.  She was able to wear her short heels and didn't have any issues during the wedding.   I have found that if I go down steps sideways, I don't hurt as bad.    From initial eval: Patient reports she has been see [name] for about 5 years for her knees.  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 does not feel like she needs TKA just yet.  She states she also has some medial joint line pain on the right but that her left knee is worse than her right.  She has frequent Baker's cysts on both knees and chronic pain.  She also reports fat pad irritation issues.  She  has been having aspiration and cortisone injections about every 3 months to control the symptoms.  However, she is fairly active and has very little pain for about 2 1/2 months then she will have some return of pain just before her next scheduled aspiration and injection.  She is retired and feels she is fairly active. She hopes to avoid TKA's in the near future and wants to gain her muscle back.    PERTINENT HISTORY: 10/31/23 both knees aspirated, [name] confirms end stage OA both knees.  PT for now.  F/U with [name] in 6-8 weeks PAIN:   12/19/23 Are you having pain? Yes: NPRS scale: 8/10 in L thigh  Pain location: bilateral knees Pain  description: aching Aggravating factors: stairs, bending, stooping and squatting Relieving factors: rest, ice, aspiration and cortisone  PRECAUTIONS: None  RED FLAGS: None   WEIGHT BEARING RESTRICTIONS: No  FALLS:  Has patient fallen in last 6 months? No  LIVING ENVIRONMENT: Lives with: lives with their family Lives in: House/apartment Stairs: Yes: Internal: 12 steps; on right going up and External: 4 steps; on right going up Has following equipment at home:  none  (does not need any yet)  OCCUPATION: retired  PLOF: Independent, Independent with basic ADLs, Independent with household mobility without device, Independent with community mobility without device, Independent with homemaking with ambulation, Independent with gait, and Independent with transfers  PATIENT GOALS: to prolong need for TKA's  NEXT MD VISIT: 6-8 weeks from 10/31/23  OBJECTIVE:  Note: Objective measures were completed at Evaluation unless otherwise noted.  DIAGNOSTIC FINDINGS: xrays reveal end stage OA  PATIENT SURVEYS:  LEFS 64/80= 80%  COGNITION: Overall cognitive status: Within functional limits for tasks assessed     SENSATION: WFL  EDEMA:  Circumferential: mid patella (aspirated last on 10/31/23;  right:  34.3 cm    , Left:  35 cm    Measure next visit MUSCLE LENGTH: Hamstrings: Right 50 deg; Left 50 deg Thomas test: Right pos deg; Left pos deg  POSTURE: rounded shoulders and severe scoliosis   PALPATION: Severe crepitus bilateral PF and joint line  LOWER EXTREMITY ROM:  WFL  LOWER EXTREMITY MMT:  MMT Right eval Right 12/19/23 Left eval Left 12/19/23  Hip flexion 3+ 4+ 3+ 4-  Hip extension 3+ 4- 3+ 4-  Hip abduction 4- 4 4- 4  Hip adduction 4+ 4+ 4+ 4+  Hip internal rotation      Hip external rotation      Knee flexion 4- 4 4- 4-  Knee extension 4- 4 4- 4-  Ankle dorsiflexion      Ankle plantarflexion      Ankle inversion      Ankle eversion       (Blank rows = not  tested)  LOWER EXTREMITY SPECIAL TESTS:  Knee special tests: Patellafemoral grind test: positive , Step up/down test: positive , and Patella tap test (ballotable patella): negative  FUNCTIONAL TESTS:  Initial eval:  5 times sit to stand: 14.94 sec Timed up and go (TUG): 10.98 sec  12/19/2023: 5 times sit to stand: 16.40 sec sec Timed up and go (TUG): 11.01 sec  GAIT: Distance walked: 30 Assistive device utilized: None Level of assistance: Modified independence Comments: antalgic  TREATMENT DATE: 12/28/23  Nustep x 5 min (PT present to discuss status) Long session of education for patient to understand the nature of arthritis Sit to stand attempted without use of hands but patient unable : spent lengthy amount of time teaching fwd weight shift Hip matrix 40 lbs hip abduction and extension both LE's 2 x 10 each Seated LAQ 2 x 10 with 4 lbs Squats to chair with balance pad x 10 (attempted but unable so we switched to sit to stand) Sit to stand with mirror in front of patient for visual feedback (patient needed verbal and visual feedback for correct alignment) Educated on alignment and hip strength issues that contribute to her functional strength deficits  12/26/23  Nustep x 8 min (PT present to discuss status) Leg Press 50 lbs x 20 Seated ball squeeze x 20 (purple ball) Seated ball squeeze with LAQ 2 x 10 with 2 lb ankle weights Seated clam x 20 Lateral band walks with yellow loop x 3 laps Sit to stand 2 x 10 Moist heat with IFC estim to left hip and thigh x 12 min  12/19/23  10th visit Re-assessment completed Moist heat to left hip and lateral thigh in right side lying x 12 min with IFC e-stim  12/12/23  Nustep x 8 min (PT present to discuss status) Lengthy discussion regarding patients multiple areas of pain and kinetic chain effect of her  asymmetries.  Explained that she will likely always be battling joint and soft tissue areas of pain without doing anything different.  Options of stretching, strengthening, DN, modalities, meds and surgery were discussed.  Explained that when all of the conservative measures have been attempted and her pain is interfering with her quality of life, this is a point where she must decide on whether to live with the pain and manage as best she can with exercise or do surgery.     Use of visual aides to educate patient on anatomy and biomechanics of the lower extremities.  Patient repeatedly states she doesn't understand why she is hurting.  Her knees are known end stage OA, she likely has OA of the left hip along with scoliosis.  This was all explained to her in detail.   Seated short side stretch for scoliosis (overhead trunk stretch to left) x 10 holding 5 sec each Patient then inquired about why we were stretching this side, she thought she needed to stretch the opposite side: Used visual and verbal cues to explain that her shortened side was on the right.  She was having difficulty understanding as her hip height was also in question.   Seated clam x 20 with yellow loop Supine clam x 20 with yellow loop Side lying clam bilateral 2 x 10 each LE    12/04/23  Discussed status and that she feels exercises are making her L leg pain worse, discussed bursitis with visual aides provided, assessed posture (even pelvic landmarks, left lateral shift and scoliosis) Hip matrix: abd and extension 1 x 10 each LE with 25# Step down  2" 10  and 4"  x 10 B - pain in the left knee at end of second set. Hip hike B x 10 on stairs Lateral glide B x 5 feels best on L Doorway QL stretch B Side lumbar stretch B with pillow under waist and top leg hanging bwd off EOB   11/08/23  Initial eval completed and initiated HEP   PATIENT EDUCATION:  Education details: Initiated HEP Person educated:  Patient Education method:  Explanation, Demonstration, Verbal cues, and Handouts Education comprehension: verbalized understanding, returned demonstration, and verbal cues required  HOME EXERCISE PROGRAM: Access Code: [email or web address] URL: [email or web address] Date: 12/04/2023 Prepared by: Raynelle Fanning  Exercises - Sidelying Thoracic Rotation with Open Book  - 1 x daily - 7 x weekly - 1-2 sets - 10 reps - Seated Cervical Retraction  - 1 x daily - 7 x weekly - 2 sets - 10 reps - Shoulder External Rotation and Scapular Retraction with Resistance  - 1 x daily - 7 x weekly - 2 sets - 10 reps - Standing Shoulder Horizontal Abduction with Resistance  - 1 x daily - 7 x weekly - 2 sets - 10 reps - Standing Shoulder Row with Anchored Resistance  - 1 x daily - 7 x weekly - 2 sets - 10 reps - Shoulder extension with resistance - Neutral  - 1 x daily - 7 x weekly - 2 sets - 10 reps - Cat Cow  - 1 x daily - 7 x weekly - 2 sets - 10 reps - Quadruped Full Range Thoracic Rotation with Reach  - 2 x daily - 7 x weekly - 2 sets - 10 reps - Child's Pose Stretch  - 1 x daily - 7 x weekly - 1 sets - 3 reps - 20-30 sec hold - Left Standing Lateral Shift Correction at Wall - Hold  - 2 x daily - 7 x weekly - 1 sets - 3 reps - 20-30 sec hold - Standing Quadratus Lumborum Stretch with Doorway  - 2 x daily - 7 x weekly - 1 sets - 2 reps - 30 sec hold - Sidelying ITB and lumbar stretch (Mirrored)  - 2 x daily - 7 x weekly - 1 sets - 3 reps - 30-60 seconds hold  ASSESSMENT:  CLINICAL IMPRESSION: Abigail [name] continues to need lengthy sessions of basic education and teaching about her condition and how to progress, pain control, anatomy, etc.  She seems to do a fair amount of reading up on various conditions on the internet.  We did more isolated hip stability and functional quad training today.  She continues to be extremely weak in both hips.  She would benefit from continued skilled PT for hip strengthening, quad rehab and functional training.     OBJECTIVE IMPAIRMENTS: Abnormal gait, decreased balance, decreased mobility, difficulty walking, decreased ROM, decreased strength, increased edema, increased fascial restrictions, increased muscle spasms, impaired flexibility, postural dysfunction, and pain.   ACTIVITY LIMITATIONS: carrying, lifting, bending, sitting, standing, squatting, sleeping, stairs, transfers, bed mobility, toileting, and caring for others  PARTICIPATION LIMITATIONS: meal prep, cleaning, laundry, driving, shopping, community activity, yard work, and church  PERSONAL FACTORS: Age, Fitness, Time since onset of injury/illness/exacerbation, and 1-2 comorbidities: Htn and mitral valve prolapse  are also affecting patient's functional outcome.   REHAB POTENTIAL: Fair end stage OA  CLINICAL DECISION MAKING: Evolving/moderate complexity  EVALUATION COMPLEXITY: Moderate   GOALS: Goals reviewed with patient? Yes  SHORT TERM GOALS: Target date: 12/06/2023  Pain report to be no greater than 4/10  Baseline: Goal status: In progress  2.  Patient will be independent with initial HEP  Baseline:  Goal status: In progress   LONG TERM GOALS: Target date: 01/03/2024   Patient to report pain no greater than 2/10  Baseline:  Goal status: In Progress  2.  Patient to be independent with advanced HEP  Baseline:  Goal status: In Progress  3.  Patient to be able  to ascend and descend steps with pain no greater than 2/10  Baseline:  Goal status: In Progress  4.  Patient to be able to bend, stoop and squat with pain no greater than 2/10  Baseline:  Goal status: In Progress  5.  Patient to be able to stand or walk for at least 15 min without leg pain  Baseline:  Goal status: In Progress  6.  Patient to report 85% improvement in overall symptoms  Baseline:  Goal status: In Progress   PLAN:  PT FREQUENCY: 1-2x/week  PT DURATION: 8 weeks  PLANNED INTERVENTIONS: 97110-Therapeutic exercises, 97530- Therapeutic  activity, [id number]- Neuromuscular re-education, 97535- Self Care, 29518- Manual therapy, [id number]- Gait training, [phone]- Aquatic Therapy, 97014- Electrical stimulation (unattended), [id number]- Electrical stimulation (manual), [id number]- Vasopneumatic device, [id number]- Ultrasound, [id number]- Ionotophoresis 4mg /ml Dexamethasone, Patient/Family education, Balance training, Stair training, Taping, Dry Needling, Joint mobilization, Compression bandaging, Vestibular training, Visual/preceptual remediation/compensation, DME instructions, Cryotherapy, and Moist heat  PLAN FOR NEXT SESSION: Continue current POC.  If patient not progressing, consider pool program.     Victorino Dike B. Karlye Ihrig, PT 12/28/23 4:31 PM [facility] [address] [place], [place] [address] Phone # [phone] Fax [phone]

## 2024-01-02 ENCOUNTER — Ambulatory Visit: Payer: Medicare Other

## 2024-01-02 DIAGNOSIS — G8929 Other chronic pain: Secondary | ICD-10-CM | POA: Diagnosis not present

## 2024-01-02 DIAGNOSIS — M170 Bilateral primary osteoarthritis of knee: Secondary | ICD-10-CM

## 2024-01-02 DIAGNOSIS — R262 Difficulty in walking, not elsewhere classified: Secondary | ICD-10-CM | POA: Diagnosis not present

## 2024-01-02 DIAGNOSIS — M6281 Muscle weakness (generalized): Secondary | ICD-10-CM

## 2024-01-02 DIAGNOSIS — R293 Abnormal posture: Secondary | ICD-10-CM

## 2024-01-02 DIAGNOSIS — R252 Cramp and spasm: Secondary | ICD-10-CM | POA: Diagnosis not present

## 2024-01-02 DIAGNOSIS — M25562 Pain in left knee: Secondary | ICD-10-CM | POA: Diagnosis not present

## 2024-01-02 DIAGNOSIS — M25561 Pain in right knee: Secondary | ICD-10-CM | POA: Diagnosis not present

## 2024-01-02 NOTE — Therapy (Signed)
 OUTPATIENT PHYSICAL THERAPY LOWER EXTREMITY TREATMENT PHYSICAL THERAPY DISCHARGE SUMMARY  Visits from Start of Care: 13  Current functional level related to goals / functional outcomes: See below   Remaining deficits: See below   Education / Equipment: See below   Patient agrees to discharge. Patient goals were met. Patient is being discharged due to maximized rehab potential.     Patient Name: Abigail [name] MRN: [id number] DOB:Dec 20, 1940, 83 y.o., female Today's Date: 01/02/2024  END OF SESSION:  PT End of Session - 01/02/24 1405     Visit Number 13    Date for PT Re-Evaluation 01/03/24    Authorization Type [employer]    Progress Note Due on Visit 10    PT Start Time 1400    PT Stop Time 1453    PT Time Calculation (min) 53 min    Activity Tolerance Patient tolerated treatment well    Behavior During Therapy WFL for tasks assessed/performed                 Past Medical History:  Diagnosis Date   Arthritis    oa   Baker's cyst    behind left knee   Constipation    Hemorrhoids    Hypertension    MVP (mitral valve prolapse)    Neoplasm    of appendix   Rosacea    Past Surgical History:  Procedure Laterality Date   AUGMENTATION MAMMAPLASTY Bilateral 1990   saline   CARDIAC CATHETERIZATION  17years ago   small amount of blockage   CATRACTS Bilateral    COLONOSCOPY  2006 and oct 2018   ILEOCECETOMY N/A 08/17/2017   Procedure: OPEN ILEOCECETOMY;  Surgeon: Darnell Level, MD;  Location: [facility];  Service: General;  Laterality: N/A;  [facility]   Patient Active Problem List   Diagnosis Date Noted   Degenerative disc disease, lumbar 02/15/2023   Osteoporosis 09/22/2021   Greater trochanteric bursitis of left hip 11/04/2020   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   Gastroesophageal reflux disease 06/29/2017   Hypercholesterolemia 06/29/2017   Hypertensive disorder 06/29/2017   Mitral valve disorder 06/29/2017   Tear  of LCL (lateral collateral ligament) of knee, left, subsequent encounter 08/09/2016   Degenerative arthritis of left knee 07/06/2016   Degenerative arthritis of right knee 02/25/2016   Piriformis syndrome of right side 06/24/2015   Primary localized osteoarthrosis, lower leg 06/13/2014   Baker's cyst of knee 05/20/2014    PCP: Pcp, No   REFERRING PROVIDER: Judi Saa, DO  REFERRING DIAG: M25.561,M25.562,G89.29 (ICD-10-CM) - Chronic pain of both knees M71.22 (ICD-10-CM) - Synovial cyst of left knee M17.11 (ICD-10-CM) - Primary osteoarthritis of right knee M17.12 (ICD-10-CM) - Primary osteoarthritis of left knee  THERAPY DIAG:  Primary osteoarthritis of both knees  Chronic pain of both knees  Abnormal posture  Difficulty in walking, not elsewhere classified  Cramp and spasm  Muscle weakness (generalized)  Rationale for Evaluation and Treatment: Rehabilitation  ONSET DATE: 10/31/2023  SUBJECTIVE:   SUBJECTIVE STATEMENT: Patient reports she is doing ok.  She states she just has random pain in the shin or the thigh but she knows this is due to her multiple orthopedic issues.    From initial eval: Patient reports she has been see [name] for about 5 years for her knees.  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 does not feel like she needs TKA just yet.  She  states she also has some medial joint line pain on the right but that her left knee is worse than her right.  She has frequent Baker's cysts on both knees and chronic pain.  She also reports fat pad irritation issues.  She has been having aspiration and cortisone injections about every 3 months to control the symptoms.  However, she is fairly active and has very little pain for about 2 1/2 months then she will have some return of pain just before her next scheduled aspiration and injection.  She is retired and feels she is fairly active. She hopes to avoid TKA's in the near future and wants to  gain her muscle back.    PERTINENT HISTORY: 10/31/23 both knees aspirated, [name] confirms end stage OA both knees.  PT for now.  F/U with [name] in 6-8 weeks PAIN:   01/02/24 Are you having pain? Yes: NPRS scale: 5/10 in L thigh ,  8/10 at worst (after sitting for a while) Pain location: bilateral knees Pain description: aching Aggravating factors: stairs, bending, stooping and squatting Relieving factors: rest, ice, aspiration and cortisone  PRECAUTIONS: None  RED FLAGS: None   WEIGHT BEARING RESTRICTIONS: No  FALLS:  Has patient fallen in last 6 months? No  LIVING ENVIRONMENT: Lives with: lives with their family Lives in: House/apartment Stairs: Yes: Internal: 12 steps; on right going up and External: 4 steps; on right going up Has following equipment at home:  none  (does not need any yet)  OCCUPATION: retired  PLOF: Independent, Independent with basic ADLs, Independent with household mobility without device, Independent with community mobility without device, Independent with homemaking with ambulation, Independent with gait, and Independent with transfers  PATIENT GOALS: to prolong need for TKA's  NEXT MD VISIT: 6-8 weeks from 10/31/23  OBJECTIVE:  Note: Objective measures were completed at Evaluation unless otherwise noted.  DIAGNOSTIC FINDINGS: xrays reveal end stage OA  PATIENT SURVEYS:  LEFS 64/80= 80%  COGNITION: Overall cognitive status: Within functional limits for tasks assessed     SENSATION: WFL  EDEMA:  Circumferential: mid patella (aspirated last on 10/31/23;  right:  34.3 cm    , Left:  35 cm    01/02/24 :  Right : 34.4 cm,  Left: 46 cm  MUSCLE LENGTH: Hamstrings: Right 50 deg; Left 50 deg Thomas test: Right pos deg; Left pos deg  POSTURE: rounded shoulders and severe scoliosis   PALPATION: Severe crepitus bilateral PF and joint line  LOWER EXTREMITY ROM:  [facility]  LOWER EXTREMITY MMT:  MMT Right eval Right 12/19/23 Right 01/02/24  Left eval Left 12/19/23 Left 01/02/24  Hip flexion 3+ 4+ 4+ 3+ 4- 4  Hip extension 3+ 4- 4 3+ 4- 4  Hip abduction 4- 4 4+ 4- 4 4+  Hip adduction 4+ 4+ 4+ 4+ 4+ 4+  Hip internal rotation   4     Hip external rotation   4     Knee flexion 4- 4 4 4- 4- 4  Knee extension 4- 4 4 4- 4- 4  Ankle dorsiflexion        Ankle plantarflexion        Ankle inversion        Ankle eversion         (Blank rows = not tested)  LOWER EXTREMITY SPECIAL TESTS:  Knee special tests: Patellafemoral grind test: positive , Step up/down test: positive , and Patella tap test (ballotable patella): negative  FUNCTIONAL TESTS:  Initial eval:  5 times sit to stand: 14.94 sec Timed up and go (TUG): 10.98 sec  12/19/2023: 5 times sit to stand: 16.40 sec sec Timed up and go (TUG): 11.01 sec  01/02/2024: 5 times sit to stand: 13.50 sec  Timed up and go (TUG): 8.30 sec  GAIT: Distance walked: 30 Assistive device utilized: None Level of assistance: Modified independence Comments: antalgic                                                                                                                                TREATMENT DATE: 01/02/24  Nustep x 5 min  DC assessment completed Updated and reviewed HEP and provided DC plan: discussed how to progress and symptoms to see MD about  12/28/23  Nustep x 5 min (PT present to discuss status) Long session of education for patient to understand the nature of arthritis Sit to stand attempted without use of hands but patient unable : spent lengthy amount of time teaching fwd weight shift Hip matrix 40 lbs hip abduction and extension both LE's 2 x 10 each Seated LAQ 2 x 10 with 4 lbs Squats to chair with balance pad x 10 (attempted but unable so we switched to sit to stand) Sit to stand with mirror in front of patient for visual feedback (patient needed verbal and visual feedback for correct alignment) Educated on alignment and hip strength issues that contribute to her  functional strength deficits  12/26/23  Nustep x 8 min (PT present to discuss status) Leg Press 50 lbs x 20 Seated ball squeeze x 20 (purple ball) Seated ball squeeze with LAQ 2 x 10 with 2 lb ankle weights Seated clam x 20 Lateral band walks with yellow loop x 3 laps Sit to stand 2 x 10 Moist heat with IFC estim to left hip and thigh x 12 min  12/19/23  10th visit Re-assessment completed Moist heat to left hip and lateral thigh in right side lying x 12 min with IFC e-stim     11/08/23  Initial eval completed and initiated HEP   PATIENT EDUCATION:  Education details: Initiated HEP Person educated: Patient Education method: Programmer, multimedia, Facilities manager, Verbal cues, and Handouts Education comprehension: verbalized understanding, returned demonstration, and verbal cues required  HOME EXERCISE PROGRAM: Access Code: [email or web address] URL: [email or web address] Date: 12/04/2023 Prepared by: Raynelle Fanning  Exercises - Sidelying Thoracic Rotation with Open Book  - 1 x daily - 7 x weekly - 1-2 sets - 10 reps - Seated Cervical Retraction  - 1 x daily - 7 x weekly - 2 sets - 10 reps - Shoulder External Rotation and Scapular Retraction with Resistance  - 1 x daily - 7 x weekly - 2 sets - 10 reps - Standing Shoulder Horizontal Abduction with Resistance  - 1 x daily - 7 x weekly - 2 sets - 10 reps - Standing Shoulder Row with Anchored Resistance  - 1 x daily - 7 x weekly - 2  sets - 10 reps - Shoulder extension with resistance - Neutral  - 1 x daily - 7 x weekly - 2 sets - 10 reps - Cat Cow  - 1 x daily - 7 x weekly - 2 sets - 10 reps - Quadruped Full Range Thoracic Rotation with Reach  - 2 x daily - 7 x weekly - 2 sets - 10 reps - Child's Pose Stretch  - 1 x daily - 7 x weekly - 1 sets - 3 reps - 20-30 sec hold - Left Standing Lateral Shift Correction at Wall - Hold  - 2 x daily - 7 x weekly - 1 sets - 3 reps - 20-30 sec hold - Standing Quadratus Lumborum Stretch with Doorway  - 2 x daily - 7 x  weekly - 1 sets - 2 reps - 30 sec hold - Sidelying ITB and lumbar stretch (Mirrored)  - 2 x daily - 7 x weekly - 1 sets - 3 reps - 30-60 seconds hold  ASSESSMENT:  CLINICAL IMPRESSION: Francheska has reached max potential for skilled PT.  She has a comprehensive HEP to follow and returned understanding of how to progress.  She may be best suited for aquatics therapy based on her multiple orthopedic issues (scoliosis, Right hip pain and bilateral end stage knee OA).   Compliance with HEP is questionable as she self-directed a lot throughout the course of this episode of PT.  She responded well to strengthening and did gain strength according to objective findings.  Her primary issue is the severity of her knee OA but she has quite pronounced scoliosis that is causing a left hip issue and back pain that becomes exacerbated with certain activities for the knees.  Again, she would likely be a good candidate for pool therapy but she plans on working out at the gym and starting her outdoor gardening and yardwork soon.  She would like to just work on her HEP and go to the gym to see how she does.    OBJECTIVE IMPAIRMENTS: Abnormal gait, decreased balance, decreased mobility, difficulty walking, decreased ROM, decreased strength, increased edema, increased fascial restrictions, increased muscle spasms, impaired flexibility, postural dysfunction, and pain.   ACTIVITY LIMITATIONS: carrying, lifting, bending, sitting, standing, squatting, sleeping, stairs, transfers, bed mobility, toileting, and caring for others  PARTICIPATION LIMITATIONS: meal prep, cleaning, laundry, driving, shopping, community activity, yard work, and church  PERSONAL FACTORS: Age, Fitness, Time since onset of injury/illness/exacerbation, and 1-2 comorbidities: Htn and mitral valve prolapse  are also affecting patient's functional outcome.   REHAB POTENTIAL: Fair end stage OA  CLINICAL DECISION MAKING: Evolving/moderate  complexity  EVALUATION COMPLEXITY: Moderate   GOALS: Goals reviewed with patient? Yes  SHORT TERM GOALS: Target date: 12/06/2023  Pain report to be no greater than 4/10  Baseline: Goal status: Not met  2.  Patient will be independent with initial HEP  Baseline:  Goal status: MET   LONG TERM GOALS: Target date: 01/03/2024   Patient to report pain no greater than 2/10  Baseline:  Goal status: Not met  2.  Patient to be independent with advanced HEP  Baseline:  Goal status: MET  3.  Patient to be able to ascend and descend steps with pain no greater than 2/10  Baseline:  Goal status: Not met  4.  Patient to be able to bend, stoop and squat with pain no greater than 2/10  Baseline:  Goal status: Not met  5.  Patient to be able to stand  or walk for at least 15 min without leg pain  Baseline:  Goal status: MET  6.  Patient to report 85% improvement in overall symptoms  Baseline:  Goal status: Not met   PLAN:  PT FREQUENCY: 1-2x/week  PT DURATION: 8 weeks  PLANNED INTERVENTIONS: 97110-Therapeutic exercises, 97530- Therapeutic activity, [id number]- Neuromuscular re-education, 97535- Self Care, 78469- Manual therapy, [id number]- Gait training, [phone]- Aquatic Therapy, 97014- Electrical stimulation (unattended), [id number]- Electrical stimulation (manual), [id number]- Vasopneumatic device, [id number]- Ultrasound, [id number]- Ionotophoresis 4mg /ml Dexamethasone, Patient/Family education, Balance training, Stair training, Taping, Dry Needling, Joint mobilization, Compression bandaging, Vestibular training, Visual/preceptual remediation/compensation, DME instructions, Cryotherapy, and Moist heat  PLAN FOR NEXT SESSION: Not met    Victorino Dike B. Shalin Vonbargen, PT 01/02/24 5:30 PM [facility] [address] [place], [place] [address] Phone # [phone] Fax [phone]

## 2024-01-04 NOTE — Progress Notes (Deleted)
 [facility] [address] [place] [address] Phone: [phone] Subjective:    I'm seeing this patient by the request  of:  Pcp, No  CC: Knee pain and swelling follow-up  WNU:[id number]  10/31/2023 Once again bilateral aspirations done.  Patient still wants to avoid any surgical intervention if possible.  Patient has done well but does have significant arthritic changes we will need to continue to monitor.  Discussed icing regimen and home exercises.  Patient is to increase activity slowly.  Follow-up with me again in 6 to 8 weeks otherwise      Update 01/09/2024 Abigail [name] is a 83 y.o. female coming in with complaint of B knee pain. Baker's cyst of L knee. Patient states      Past Medical History:  Diagnosis Date   Arthritis    oa   Baker's cyst    behind left knee   Constipation    Hemorrhoids    Hypertension    MVP (mitral valve prolapse)    Neoplasm    of appendix   Rosacea    Past Surgical History:  Procedure Laterality Date   AUGMENTATION MAMMAPLASTY Bilateral 1990   saline   CARDIAC CATHETERIZATION  17years ago   small amount of blockage   CATRACTS Bilateral    COLONOSCOPY  2006 and oct 2018   ILEOCECETOMY N/A 08/17/2017   Procedure: OPEN ILEOCECETOMY;  Surgeon: Darnell Level, MD;  Location: [facility];  Service: General;  Laterality: N/A;  [facility] PATHWAY   Social History   Socioeconomic History   Marital status: Married    Spouse name: Not on file   Number of children: Not on file   Years of education: Not on file   Highest education level: Not on file  Occupational History   Not on file  Tobacco Use   Smoking status: Former    Current packs/day: 0.50    Average packs/day: 0.5 packs/day for 10.0 years (5.0 ttl pk-yrs)    Types: Cigarettes   Smokeless tobacco: Never   Tobacco comments:    quit 35 yrs ago  Vaping Use   Vaping status: Never Used  Substance and Sexual Activity   Alcohol use: Yes    Alcohol/week: 7.0  standard drinks of alcohol    Types: 7 Glasses of wine per week    Comment: 1 glass red wine  per night   Drug use: No   Sexual activity: Not on file  Other Topics Concern   Not on file  Social History Narrative   Not on file   Social Drivers of Health   Financial Resource Strain: Not on file  Food Insecurity: Not on file  Transportation Needs: Not on file  Physical Activity: Not on file  Stress: Not on file  Social Connections: Not on file   Allergies  Allergen Reactions   Statins Diarrhea and Nausea And Vomiting   Family History  Problem Relation Age of Onset   Colon cancer Cousin        34's maybe     Current Outpatient Medications (Cardiovascular):    losartan (COZAAR) 100 MG tablet, Take 100 mg by mouth daily.   Current Outpatient Medications (Analgesics):    Acetaminophen-Aspirin Buffered 250-250 MG tablet, Take 0.05 tablets by mouth as needed.   aspirin EC 81 MG tablet, Take 81 mg by mouth daily.  Current Outpatient Medications (Hematological):    Cyanocobalamin (VITAMIN B-12 PO), Take 1 capsule by mouth daily at 6 (  six) AM.  Current Outpatient Medications (Other):    Ascorbic Acid (VITAMIN C PO), Take 1 tablet by mouth daily at 6 (six) AM.   COVID-19 mRNA bivalent vaccine, Pfizer, (PFIZER COVID-19 VAC BIVALENT) injection, Inject into the muscle.   COVID-19 mRNA Vac-TriS, Pfizer, (PFIZER-BIONT COVID-19 VAC-TRIS) SUSP injection, Inject into the muscle.   COVID-19 mRNA vaccine 2023-2024 (COMIRNATY) syringe, Inject into the muscle.   famotidine-calcium carbonate-magnesium hydroxide (PEPCID COMPLETE) 10-800-165 MG chewable tablet, Chew 1 tablet by mouth daily as needed.   influenza vaccine adjuvanted (FLUAD QUADRIVALENT) 0.5 ML injection, Inject into the muscle.   influenza vaccine adjuvanted (FLUAD) 0.5 ML injection, Inject into the muscle.   LORazepam (ATIVAN) 0.5 MG tablet, Take 0.5 mg by mouth 2 (two) times daily as needed for anxiety.   MAGNESIUM GLYCINATE  PO, Take 200 mg of amoxicillin by mouth at bedtime.   RSV vaccine recomb adjuvanted (AREXVY) 120 MCG/0.5ML injection, Inject into the muscle.   Reviewed prior external information including notes and imaging from  primary care provider As well as notes that were available from care everywhere and other healthcare systems.  Past medical history, social, surgical and family history all reviewed in electronic medical record.  No pertanent information unless stated regarding to the chief complaint.   Review of Systems:  No headache, visual changes, nausea, vomiting, diarrhea, constipation, dizziness, abdominal pain, skin rash, fevers, chills, night sweats, weight loss, swollen lymph nodes, body aches, joint swelling, chest pain, shortness of breath, mood changes. POSITIVE muscle aches  Objective  There were no vitals taken for this visit.   General: No apparent distress alert and oriented x3 mood and affect normal, dressed appropriately.  HEENT: Pupils equal, extraocular movements intact  Respiratory: Patient's speak in full sentences and does not appear short of breath  Cardiovascular: No lower extremity edema, non tender, no erythema  Antalgic gait noted Left knee shows    Impression and Recommendations:     The above documentation has been reviewed and is accurate and complete Judi Saa, DO

## 2024-01-09 ENCOUNTER — Ambulatory Visit: Payer: Medicare Other | Admitting: Family Medicine

## 2024-01-10 ENCOUNTER — Other Ambulatory Visit (HOSPITAL_COMMUNITY): Payer: Self-pay

## 2024-01-11 NOTE — Progress Notes (Unsigned)
 [facility] [address] [place] [address] Phone: [phone] Subjective:   Bruce Donath, am serving as a scribe for Dr. Antoine Primas.  I'm seeing this patient by the request  of:  Pcp, No  CC: Left knee pain  BJY:[id number]  10/31/2023 Once again bilateral aspirations done.  Patient still wants to avoid any surgical intervention if possible.  Patient has done well but does have significant arthritic changes we will need to continue to monitor.  Discussed icing regimen and home exercises.  Patient is to increase activity slowly.  Follow-up with me again in 6 to 8 weeks otherwise.     Update 01/12/2024 KATALEIA QUARANTA is a 83 y.o. female coming in with complaint of B knee pain. Patient states that she was doing therapy and just finished. L hip pain has improved. Does have pain in GT when sleeping on that side.   L knee pain in back of knee.        Past Medical History:  Diagnosis Date   Arthritis    oa   Baker's cyst    behind left knee   Constipation    Hemorrhoids    Hypertension    MVP (mitral valve prolapse)    Neoplasm    of appendix   Rosacea    Past Surgical History:  Procedure Laterality Date   AUGMENTATION MAMMAPLASTY Bilateral 1990   saline   CARDIAC CATHETERIZATION  17years ago   small amount of blockage   CATRACTS Bilateral    COLONOSCOPY  2006 and oct 2018   ILEOCECETOMY N/A 08/17/2017   Procedure: OPEN ILEOCECETOMY;  Surgeon: Darnell Level, MD;  Location: [facility];  Service: General;  Laterality: N/A;  [facility] PATHWAY   Social History   Socioeconomic History   Marital status: Married    Spouse name: Not on file   Number of children: Not on file   Years of education: Not on file   Highest education level: Not on file  Occupational History   Not on file  Tobacco Use   Smoking status: Former    Current packs/day: 0.50    Average packs/day: 0.5 packs/day for 10.0 years (5.0 ttl pk-yrs)    Types: Cigarettes    Smokeless tobacco: Never   Tobacco comments:    quit 35 yrs ago  Vaping Use   Vaping status: Never Used  Substance and Sexual Activity   Alcohol use: Yes    Alcohol/week: 7.0 standard drinks of alcohol    Types: 7 Glasses of wine per week    Comment: 1 glass red wine  per night   Drug use: No   Sexual activity: Not on file  Other Topics Concern   Not on file  Social History Narrative   Not on file   Social Drivers of Health   Financial Resource Strain: Not on file  Food Insecurity: Not on file  Transportation Needs: Not on file  Physical Activity: Not on file  Stress: Not on file  Social Connections: Not on file   Allergies  Allergen Reactions   Statins Diarrhea and Nausea And Vomiting   Family History  Problem Relation Age of Onset   Colon cancer Cousin        71's maybe     Current Outpatient Medications (Cardiovascular):    losartan (COZAAR) 100 MG tablet, Take 100 mg by mouth daily.   Current Outpatient Medications (Analgesics):    Acetaminophen-Aspirin Buffered 250-250 MG tablet, Take 0.05  tablets by mouth as needed.   aspirin EC 81 MG tablet, Take 81 mg by mouth daily.  Current Outpatient Medications (Hematological):    Cyanocobalamin (VITAMIN B-12 PO), Take 1 capsule by mouth daily at 6 (six) AM.  Current Outpatient Medications (Other):    Ascorbic Acid (VITAMIN C PO), Take 1 tablet by mouth daily at 6 (six) AM.   COVID-19 mRNA bivalent vaccine, Pfizer, (PFIZER COVID-19 VAC BIVALENT) injection, Inject into the muscle.   COVID-19 mRNA Vac-TriS, Pfizer, (PFIZER-BIONT COVID-19 VAC-TRIS) SUSP injection, Inject into the muscle.   COVID-19 mRNA vaccine 2023-2024 (COMIRNATY) syringe, Inject into the muscle.   famotidine-calcium carbonate-magnesium hydroxide (PEPCID COMPLETE) 10-800-165 MG chewable tablet, Chew 1 tablet by mouth daily as needed.   influenza vaccine adjuvanted (FLUAD QUADRIVALENT) 0.5 ML injection, Inject into the muscle.   influenza vaccine  adjuvanted (FLUAD) 0.5 ML injection, Inject into the muscle.   LORazepam (ATIVAN) 0.5 MG tablet, Take 0.5 mg by mouth 2 (two) times daily as needed for anxiety.   MAGNESIUM GLYCINATE PO, Take 200 mg of amoxicillin by mouth at bedtime.   RSV vaccine recomb adjuvanted (AREXVY) 120 MCG/0.5ML injection, Inject into the muscle.   Reviewed prior external information including notes and imaging from  primary care provider As well as notes that were available from care everywhere and other healthcare systems.  Past medical history, social, surgical and family history all reviewed in electronic medical record.  No pertanent information unless stated regarding to the chief complaint.   Review of Systems:  No headache, visual changes, nausea, vomiting, diarrhea, constipation, dizziness, abdominal pain, skin rash, fevers, chills, night sweats, weight loss, swollen lymph nodes, body aches, joint swelling, chest pain, shortness of breath, mood changes. POSITIVE muscle aches  Objective  Blood pressure 124/88, height 5\' 6"  (1.676 m).   General: No apparent distress alert and oriented x3 mood and affect normal, dressed appropriately.  HEENT: Pupils equal, extraocular movements intact  Respiratory: Patient's speak in full sentences and does not appear short of breath  Cardiovascular: No lower extremity edema, non tender, no erythema  Antalgic gait noted.  Left knee has severe swelling of the Baker's cyst noted today.  Very hot at the moment.  Only has 90 degrees of flexion secondary to the swelling  Procedure: Real-time Ultrasound Guided Injection of left knee Device: GE Logiq Q7 Ultrasound guided injection is preferred based studies that show increased duration, increased effect, greater accuracy, decreased procedural pain, increased response rate, and decreased cost with ultrasound guided versus blind injection.  Verbal informed consent obtained.  Time-out conducted.  Noted no overlying erythema,  induration, or other signs of local infection.  Skin prepped in a sterile fashion.  Local anesthesia: Topical Ethyl chloride.  With sterile technique and under real time ultrasound guidance: With a 22-gauge 2 inch needle patient was injected with 4 cc of 0.5% Marcaine and aspirated and 75 cc of straw-colored blood and then injected 1 cc of Kenalog 40 mg/dL. This was from a posterior approach.  Completed without difficulty  Pain immediately resolved suggesting accurate placement of the medication.  Advised to call if fevers/chills, erythema, induration, drainage, or persistent bleeding.  Images permanently stored Impression: Technically successful ultrasound guided injection.    Impression and Recommendations:     The above documentation has been reviewed and is accurate and complete Judi Saa, DO

## 2024-01-12 ENCOUNTER — Ambulatory Visit: Admitting: Family Medicine

## 2024-01-12 ENCOUNTER — Other Ambulatory Visit: Payer: Self-pay

## 2024-01-12 VITALS — BP 124/88 | Ht 66.0 in

## 2024-01-12 DIAGNOSIS — M859 Disorder of bone density and structure, unspecified: Secondary | ICD-10-CM | POA: Diagnosis not present

## 2024-01-12 DIAGNOSIS — M1712 Unilateral primary osteoarthritis, left knee: Secondary | ICD-10-CM

## 2024-01-12 DIAGNOSIS — M25562 Pain in left knee: Secondary | ICD-10-CM

## 2024-01-12 DIAGNOSIS — M816 Localized osteoporosis [Lequesne]: Secondary | ICD-10-CM | POA: Diagnosis not present

## 2024-01-12 NOTE — Assessment & Plan Note (Signed)
Repeat bone density 

## 2024-01-12 NOTE — Patient Instructions (Addendum)
 Drained L knee today DEXA scan See me again in 2 months

## 2024-01-12 NOTE — Assessment & Plan Note (Signed)
 Repeat aspiration done again today.  Tolerated the procedure well, and it is need to continue to monitor the Baker's cyst.  The patient still wants to avoid surgical intervention if possible.  Will consider diuretic if keeps coming back and reaccumulating too much.  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  Is considering doing more impression as well.  Follow-up again in 8 weeks

## 2024-02-13 ENCOUNTER — Ambulatory Visit (INDEPENDENT_AMBULATORY_CARE_PROVIDER_SITE_OTHER)
Admission: RE | Admit: 2024-02-13 | Discharge: 2024-02-13 | Disposition: A | Discharge status: Home / Self Care | Source: Ambulatory Visit | Attending: Family Medicine | Admitting: Family Medicine

## 2024-02-13 DIAGNOSIS — M810 Age-related osteoporosis without current pathological fracture: Secondary | ICD-10-CM | POA: Diagnosis not present

## 2024-02-13 DIAGNOSIS — M859 Disorder of bone density and structure, unspecified: Secondary | ICD-10-CM

## 2024-02-14 ENCOUNTER — Encounter: Payer: Self-pay | Admitting: Family Medicine

## 2024-02-14 DIAGNOSIS — H52203 Unspecified astigmatism, bilateral: Secondary | ICD-10-CM | POA: Diagnosis not present

## 2024-02-14 DIAGNOSIS — H01005 Unspecified blepharitis left lower eyelid: Secondary | ICD-10-CM | POA: Diagnosis not present

## 2024-02-14 DIAGNOSIS — H01002 Unspecified blepharitis right lower eyelid: Secondary | ICD-10-CM | POA: Diagnosis not present

## 2024-03-12 NOTE — Progress Notes (Signed)
 [facility] [address] [place] [address] Phone: [phone] Subjective:   Abigail [name], am serving as a scribe for Dr. Ronnell Coins.  I'm seeing this patient by the request  of:  Pcp, No  CC: Left knee pain follow-up  BJY:[id number]  01/12/2024 Repeat aspiration done again today.  Tolerated the procedure well, and it is need to continue to monitor the Baker's cyst.  The patient still wants to avoid surgical intervention if possible.  Will consider diuretic if keeps coming back and reaccumulating too much.  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  Is considering doing more impression as well.  Follow-up again in 8 weeks      Update 03/13/2024 Abigail [name] is a 83 y.o. female coming in with complaint of L knee pain. Patient states knee is doing okay. No new issues.    Patient was found to also have osteoporosis on the bone density.    Past Medical History:  Diagnosis Date   Arthritis    oa   Baker's cyst    behind left knee   Constipation    Hemorrhoids    Hypertension    MVP (mitral valve prolapse)    Neoplasm    of appendix   Rosacea    Past Surgical History:  Procedure Laterality Date   AUGMENTATION MAMMAPLASTY Bilateral 1990   saline   CARDIAC CATHETERIZATION  17years ago   small amount of blockage   CATRACTS Bilateral    COLONOSCOPY  2006 and oct 2018   ILEOCECETOMY N/A 08/17/2017   Procedure: OPEN ILEOCECETOMY;  Surgeon: Oralee Billow, MD;  Location: [facility];  Service: General;  Laterality: N/A;  [facility]WAY   Social History   Socioeconomic History   Marital status: Married    Spouse name: Not on file   Number of children: Not on file   Years of education: Not on file   Highest education level: Not on file  Occupational History   Not on file  Tobacco Use   Smoking status: Former    Current packs/day: 0.50    Average packs/day: 0.5 packs/day for 10.0 years (5.0 ttl pk-yrs)     Types: Cigarettes   Smokeless tobacco: Never   Tobacco comments:    quit 35 yrs ago  Vaping Use   Vaping status: Never Used  Substance and Sexual Activity   Alcohol use: Yes    Alcohol/week: 7.0 standard drinks of alcohol    Types: 7 Glasses of wine per week    Comment: 1 glass red wine  per night   Drug use: No   Sexual activity: Not on file  Other Topics Concern   Not on file  Social History Narrative   Not on file   Social Drivers of Health   Financial Resource Strain: Not on file  Food Insecurity: Not on file  Transportation Needs: Not on file  Physical Activity: Not on file  Stress: Not on file  Social Connections: Not on file   Allergies  Allergen Reactions   Statins Diarrhea and Nausea And Vomiting   Family History  Problem Relation Age of Onset   Colon cancer Cousin        56's maybe     Current Outpatient Medications (Cardiovascular):    losartan  (COZAAR ) 100 MG tablet, Take 100 mg by mouth daily.   Current Outpatient Medications (Analgesics):    Acetaminophen -Aspirin Buffered 250-250 MG tablet, Take 0.05 tablets  by mouth as needed.   aspirin EC 81 MG tablet, Take 81 mg by mouth daily.  Current Outpatient Medications (Hematological):    Cyanocobalamin (VITAMIN B-12 PO), Take 1 capsule by mouth daily at 6 (six) AM.  Current Outpatient Medications (Other):    Ascorbic Acid (VITAMIN C PO), Take 1 tablet by mouth daily at 6 (six) AM.   COVID-19 mRNA bivalent vaccine, Pfizer, (PFIZER COVID-19 VAC BIVALENT) injection, Inject into the muscle.   COVID-19 mRNA Vac-TriS, Pfizer, (PFIZER-BIONT COVID-19 VAC-TRIS) SUSP injection, Inject into the muscle.   COVID-19 mRNA vaccine 2023-2024 (COMIRNATY ) syringe, Inject into the muscle.   famotidine-calcium carbonate-magnesium hydroxide (PEPCID COMPLETE) 10-800-165 MG chewable tablet, Chew 1 tablet by mouth daily as needed.   influenza vaccine adjuvanted (FLUAD QUADRIVALENT ) 0.5 ML injection, Inject into the muscle.    influenza vaccine adjuvanted (FLUAD) 0.5 ML injection, Inject into the muscle.   LORazepam  (ATIVAN ) 0.5 MG tablet, Take 0.5 mg by mouth 2 (two) times daily as needed for anxiety.   MAGNESIUM GLYCINATE PO, Take 200 mg of amoxicillin by mouth at bedtime.   RSV vaccine recomb adjuvanted (AREXVY ) 120 MCG/0.5ML injection, Inject into the muscle.   Reviewed prior external information including notes and imaging from  primary care provider As well as notes that were available from care everywhere and other healthcare systems.  Past medical history, social, surgical and family history all reviewed in electronic medical record.  No pertanent information unless stated regarding to the chief complaint.   Review of Systems:  No headache, visual changes, nausea, vomiting, diarrhea, constipation, dizziness, abdominal pain, skin rash, fevers, chills, night sweats, weight loss, swollen lymph nodes, body aches, joint swelling, chest pain, shortness of breath, mood changes. POSITIVE muscle aches  Objective  Blood pressure 122/84, pulse 73, height 5\' 6"  (1.676 m), SpO2 97%.   General: No apparent distress alert and oriented x3 mood and affect normal, dressed appropriately.  HEENT: Pupils equal, extraocular movements intact  Respiratory: Patient's speak in full sentences and does not appear short of breath  Cardiovascular: No lower extremity edema, non tender, no erythema  Left knee exam shows significant swelling of the Baker's cyst noted.  Instability in the knees noted bilaterally.  Procedure: Real-time Ultrasound Guided aspiration and injection of left knee Baker's cyst Device: GE Logiq Q7 Ultrasound guided injection is preferred based studies that show increased duration, increased effect, greater accuracy, decreased procedural pain, increased response rate, and decreased cost with ultrasound guided versus blind injection.  Verbal informed consent obtained.  Time-out conducted.  Noted no overlying  erythema, induration, or other signs of local infection.  Skin prepped in a sterile fashion.  Local anesthesia: Topical Ethyl chloride.  With sterile technique and under real time ultrasound guidance: With a 22-gauge 2 inch needle patient was injected with 4 cc of 0.5% Marcaine  and aspirated 45 cc of straw-colored fluid then injected 1 cc of Kenalog  40 mg/dL. This was from a posterior approach Completed without difficulty  Pain immediately resolved suggesting accurate placement of the medication.  Advised to call if fevers/chills, erythema, induration, drainage, or persistent bleeding.  Images permanently stored  Impression: Technically successful ultrasound guided injection.  Procedure: Real-time Ultrasound Guided Injection of right knee Device: GE Logiq Q7 Ultrasound guided injection is preferred based studies that show increased duration, increased effect, greater accuracy, decreased procedural pain, increased response rate, and decreased cost with ultrasound guided versus blind injection.  Verbal informed consent obtained.  Time-out conducted.  Noted no overlying erythema, induration, or other  signs of local infection.  Skin prepped in a sterile fashion.  Local anesthesia: Topical Ethyl chloride.  With sterile technique and under real time ultrasound guidance: With a 22-gauge 2 inch needle patient was injected with 4 cc of 0.5% Marcaine  and aspirated 20 cc of sterile light-colored fluid then injected 1 cc of Kenalog  40 mg/mL posterior approach. Completed without difficulty  Pain immediately resolved suggesting accurate placement of the medication.  Advised to call if fevers/chills, erythema, induration, drainage, or persistent bleeding.  Images permanently stored  Impression: Technically successful ultrasound guided injection.   Impression and Recommendations:      The above documentation has been reviewed and is accurate and complete Abdelrahman Nair M Haile Toppins, DO

## 2024-03-13 ENCOUNTER — Other Ambulatory Visit: Payer: Self-pay

## 2024-03-13 ENCOUNTER — Encounter: Payer: Self-pay | Admitting: Family Medicine

## 2024-03-13 ENCOUNTER — Ambulatory Visit (INDEPENDENT_AMBULATORY_CARE_PROVIDER_SITE_OTHER): Admitting: Family Medicine

## 2024-03-13 VITALS — BP 122/84 | HR 73 | Ht 66.0 in

## 2024-03-13 DIAGNOSIS — M859 Disorder of bone density and structure, unspecified: Secondary | ICD-10-CM

## 2024-03-13 DIAGNOSIS — M816 Localized osteoporosis [Lequesne]: Secondary | ICD-10-CM | POA: Diagnosis not present

## 2024-03-13 DIAGNOSIS — M7120 Synovial cyst of popliteal space [Baker], unspecified knee: Secondary | ICD-10-CM

## 2024-03-13 DIAGNOSIS — M25562 Pain in left knee: Secondary | ICD-10-CM | POA: Diagnosis not present

## 2024-03-13 DIAGNOSIS — M25561 Pain in right knee: Secondary | ICD-10-CM

## 2024-03-13 MED ORDER — VITAMIN D (ERGOCALCIFEROL) 1.25 MG (50000 UNIT) PO CAPS: 50000.0000 [IU] | ORAL_CAPSULE | ORAL | 0 refills | Status: AC

## 2024-03-13 NOTE — Assessment & Plan Note (Addendum)
 Referral to osteoporotic clinic Vitamin D prescription

## 2024-03-13 NOTE — Assessment & Plan Note (Signed)
 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  Significant aspiration done on the left side and moderate on the right side.  Does respond well to this.  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  Will continue to monitor.  Follow-up again in 3 months otherwise.

## 2024-03-13 NOTE — Patient Instructions (Signed)
 Good to see you! Drained Cyst today Vit D prescription [facility]osis [facility] See you again in 10-12 weeks

## 2024-03-27 ENCOUNTER — Ambulatory Visit: Admitting: Physician Assistant

## 2024-04-09 ENCOUNTER — Ambulatory Visit (INDEPENDENT_AMBULATORY_CARE_PROVIDER_SITE_OTHER): Admitting: Physician Assistant

## 2024-04-09 DIAGNOSIS — M810 Age-related osteoporosis without current pathological fracture: Secondary | ICD-10-CM

## 2024-04-09 NOTE — Progress Notes (Signed)
 Office Visit Note   Patient: Abigail [name]           Date of Birth: May 07, 1941           MRN: [id number] Visit Date: 04/09/2024              Requested by: Isidro Margo, DO [address] [place],  [place] [address] PCP: Pcp, No   Assessment & Plan: Visit Diagnoses:  1. Age-related osteoporosis without current pathological fracture     Plan: [name] is a pleasant active 83 year old woman who is referred from Dr. Felipe Horton for evaluation of osteoporosis.  She is not taking any medication in the past for osteoporosis does not have a history of any fractures.  She has no cardiac history.  No history of cancer or kidney disease she has no history of ulcers or reflux.  No history of seizures.  She did go through menopause when she was 44 did not have hormone replacement therapy.  She does not drink much milk but she does try to get calcium through yogurt and other calcium sources.  She is a former social smoker but has not smoked in about 35 years.  She drinks very occasionally.  She does try to do different types of exercise and is currently getting back into the gym.  No major dental work.  She does have a history of a mother who had a fragility fracture of her hip.  Her most recent bone density score was -2.3 and -3.2 in the distal radius.  Based on her FRAX score she has a 21% chance of having a fragility fracture in her hip in the next 10 years and 29% of a major osteoporotic fracture.  Her BMI is low at under 20.  We had a long discussion I spent 45 minutes reviewing her chart and seeing her.  I would recommend Prolia.  I gave her information on this.  She is very concerned that she does have friends that have had fractures after being on Prolia.  I reviewed the risks of atypical femur fractures with her on treatment versus for the risk of her fracturing her hip.  She is currently supplementing vitamin D  50,000 international units a day and is getting a recheck on her vitamin D  she will contact me  with her decisions we talked about weightbearing exercise as well as diet she is trying to gain back some of the weight that she has recently lost  Follow-Up Instructions: Return if symptoms worsen or fail to improve.   Orders:  No orders of the defined types were placed in this encounter.  No orders of the defined types were placed in this encounter.     Procedures: No procedures performed   Clinical Data: No additional findings.   Subjective: No chief complaint on file.   HPI pleasant 83 year old active woman comes in today referred by Dr. Felipe Horton for evaluation of osteoporosis.  Review of Systems  All other systems reviewed and are negative.    Objective: Vital Signs: There were no vitals taken for this visit.  Physical Exam Constitutional:      Appearance: Normal appearance.  Pulmonary:     Effort: Pulmonary effort is normal.   Skin:    General: Skin is warm and dry.   Neurological:     General: No focal deficit present.     Mental Status: She is alert and oriented to person, place, and time.   Psychiatric:  Mood and Affect: Mood normal.        Behavior: Behavior normal.      Specialty Comments:  No specialty comments available.  Imaging: No results found.   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   Gastroesophageal reflux disease 06/29/2017   Hypercholesterolemia 06/29/2017   Hypertensive disorder 06/29/2017   Mitral valve disorder 06/29/2017   Tear of LCL (lateral collateral ligament) of knee, left, subsequent encounter 08/09/2016   Degenerative arthritis of left knee 07/06/2016   Degenerative arthritis of right knee 02/25/2016   Piriformis syndrome of right side 06/24/2015    Primary localized osteoarthrosis, lower leg 06/13/2014   Baker's cyst of knee 05/20/2014   Past Medical History:  Diagnosis Date   Arthritis    oa   Baker's cyst    behind left knee   Constipation    Hemorrhoids    Hypertension    MVP (mitral valve prolapse)    Neoplasm    of appendix   Rosacea     Family History  Problem Relation Age of Onset   Colon cancer Cousin        92's maybe    Past Surgical History:  Procedure Laterality Date   AUGMENTATION MAMMAPLASTY Bilateral 1990   saline   CARDIAC CATHETERIZATION  17years ago   small amount of blockage   CATRACTS Bilateral    COLONOSCOPY  2006 and oct 2018   ILEOCECETOMY N/A 08/17/2017   Procedure: OPEN ILEOCECETOMY;  Surgeon: Oralee Billow, MD;  Location: [facility];  Service: General;  Laterality: N/A;  ERAS PATHWAY   Social History   Occupational History   Not on file  Tobacco Use   Smoking status: Former    Current packs/day: 0.50    Average packs/day: 0.5 packs/day for 10.0 years (5.0 ttl pk-yrs)    Types: Cigarettes   Smokeless tobacco: Never   Tobacco comments:    quit 35 yrs ago  Vaping Use   Vaping status: Never Used  Substance and Sexual Activity   Alcohol use: Yes    Alcohol/week: 7.0 standard drinks of alcohol    Types: 7 Glasses of wine per week    Comment: 1 glass red wine  per night   Drug use: No   Sexual activity: Not on file

## 2024-05-21 NOTE — Progress Notes (Unsigned)
 [name] [address] [place] [address] Phone: [phone] Subjective:   Abigail [name], am serving as a scribe for Dr. Arthea Claudene.  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  Significant aspiration done on the left side and moderate on the right side.  Does respond well to this.  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  Will continue to monitor.  Follow-up again in 3 months otherwise.      Update 05/22/2024 Abigail [name] is a 83 y.o. female coming in with complaint of L knee pain. Aspirated last visit. Seen in osteoporosis clinic.  Recommendation was to start Prolia but patient has been concerned.  Patient states L knee is doing well. The R is hurting more in the bone. Didn't feel that the osteoporosis clinic did much       Past Medical History:  Diagnosis Date   Arthritis    oa   Baker's cyst    behind left knee   Constipation    Hemorrhoids    Hypertension    MVP (mitral valve prolapse)    Neoplasm    of appendix   Rosacea    Past Surgical History:  Procedure Laterality Date   AUGMENTATION MAMMAPLASTY Bilateral 1990   saline   CARDIAC CATHETERIZATION  17years ago   small amount of blockage   CATRACTS Bilateral    COLONOSCOPY  2006 and oct 2018   ILEOCECETOMY N/A 08/17/2017   Procedure: OPEN ILEOCECETOMY;  Surgeon: Eletha Boas, MD;  Location: [facility];  Service: General;  Laterality: N/A;  [facility] PATH[facility]AY   Social History   Socioeconomic History   Marital status: Married    Spouse name: Not on file   Number of children: Not on file   Years of education: Not on file   Highest education level: Not on file  Occupational History   Not on file   Tobacco Use   Smoking status: Former    Current packs/day: 0.50    Average packs/day: 0.5 packs/day for 10.0 years (5.0 ttl pk-yrs)    Types: Cigarettes   Smokeless tobacco: Never   Tobacco comments:    quit 35 yrs ago  Vaping Use   Vaping status: Never Used  Substance and Sexual Activity   Alcohol use: Yes    Alcohol/week: 7.0 standard drinks of alcohol    Types: 7 Glasses of wine per week    Comment: 1 glass red wine  per night   Drug use: No   Sexual activity: Not on file  Other Topics Concern   Not on file  Social History Narrative   Not on file   Social Drivers of Health   Financial Resource Strain: Not on file  Food Insecurity: Not on file  Transportation Needs: Not on file  Physical Activity: Not on file  Stress: Not on file  Social Connections: Not on file   Allergies  Allergen Reactions   Statins Diarrhea and Nausea And Vomiting   Family History  Problem Relation Age of Onset   Colon cancer Cousin        70's maybe     Current Outpatient Medications (Cardiovascular):    losartan  (COZAAR ) 100 MG tablet, Take 100  mg by mouth daily.   Current Outpatient Medications (Analgesics):    Acetaminophen -Aspirin Buffered 250-250 MG tablet, Take 0.05 tablets by mouth as needed.   aspirin EC 81 MG tablet, Take 81 mg by mouth daily.  Current Outpatient Medications (Hematological):    Cyanocobalamin (VITAMIN B-12 PO), Take 1 capsule by mouth daily at 6 (six) AM.  Current Outpatient Medications (Other):    Ascorbic Acid (VITAMIN C PO), Take 1 tablet by mouth daily at 6 (six) AM.   COVID-19 mRNA bivalent vaccine, Pfizer, (PFIZER COVID-19 VAC BIVALENT) injection, Inject into the muscle.   COVID-19 mRNA Vac-TriS, Pfizer, (PFIZER-BIONT COVID-19 VAC-TRIS) SUSP injection, Inject into the muscle.   COVID-19 mRNA vaccine 2023-2024 (COMIRNATY ) syringe, Inject into the muscle.   famotidine-calcium carbonate-magnesium hydroxide (PEPCID COMPLETE) 10-800-165 MG chewable tablet,  Chew 1 tablet by mouth daily as needed.   influenza vaccine adjuvanted (FLUAD  QUADRIVALENT) 0.5 ML injection, Inject into the muscle.   influenza vaccine adjuvanted (FLUAD ) 0.5 ML injection, Inject into the muscle.   LORazepam  (ATIVAN ) 0.5 MG tablet, Take 0.5 mg by mouth 2 (two) times daily as needed for anxiety.   MAGNESIUM GLYCINATE PO, Take 200 mg of amoxicillin by mouth at bedtime.   RSV vaccine recomb adjuvanted (AREXVY ) 120 MCG/0.5ML injection, Inject into the muscle.   Vitamin D , Ergocalciferol , (DRISDOL ) 1.25 MG (50000 UNIT) CAPS capsule, Take 1 capsule (50,000 Units total) by mouth every 7 (seven) days.   Reviewed prior external information including notes and imaging from  primary care provider As well as notes that were available from care everywhere and other healthcare systems.  Past medical history, social, surgical and family history all reviewed in electronic medical record.  No pertanent information unless stated regarding to the chief complaint.   Review of Systems:  No headache, visual changes, nausea, vomiting, diarrhea, constipation, dizziness, abdominal pain, skin rash, fevers, chills, night sweats, weight loss, swollen lymph nodes, body aches, joint swelling, chest pain, shortness of breath, mood changes. POSITIVE muscle aches  Objective  Blood pressure (!) 128/90, pulse 74, height 5' 6 (1.676 m), weight 111 lb (50.3 kg), SpO2 96%.   General: No apparent distress alert and oriented x3 mood and affect normal, dressed appropriately.  HEENT: Pupils equal, extraocular movements intact  Respiratory: Patient's speak in full sentences and does not appear short of breath  Cardiovascular: No lower extremity edema, non tender, no erythema  Antalgic gait noted arthritic changes noted to the knees.  No swelling noted popliteal areas left greater than right. Patient does have swelling on both knees bilaterally.  Procedure: Real-time Ultrasound Guided Injection of left  knee Device: GE Logiq Q7 Ultrasound guided injection is preferred based studies that show increased duration, increased effect, greater accuracy, decreased procedural pain, increased response rate, and decreased cost with ultrasound guided versus blind injection.  Verbal informed consent obtained.  Time-out conducted.  Noted no overlying erythema, induration, or other signs of local infection.  Skin prepped in a sterile fashion.  Local anesthesia: Topical Ethyl chloride.  With sterile technique and under real time ultrasound guidance: With a 22-gauge 2 inch needle patient was injected with 4 cc of 0.5% Marcaine  and aspirated 70 cc of straw-colored fluid 1 cc of Kenalog  40 mg/dL. This was from a posterior approach.  Completed without difficulty  Pain immediately resolved suggesting accurate placement of the medication.  Advised to call if fevers/chills, erythema, induration, drainage, or persistent bleeding.  Images permanently stored  Impression: Technically successful ultrasound guided injection.  Procedure: Real-time Ultrasound  Guided Injection of right knee Device: GE Logiq Q7 Ultrasound guided injection is preferred based studies that show increased duration, increased effect, greater accuracy, decreased procedural pain, increased response rate, and decreased cost with ultrasound guided versus blind injection.  Verbal informed consent obtained.  Time-out conducted.  Noted no overlying erythema, induration, or other signs of local infection.  Skin prepped in a sterile fashion.  Local anesthesia: Topical Ethyl chloride.  With sterile technique and under real time ultrasound guidance: With a 22-gauge 2 inch needle patient was injected with 4 cc of 0.5% Marcaine  and aspirated 25 cc of straw-colored fluid then injected 1 cc of Kenalog  40 mg/dL. This was from a posterior approach.  Completed without difficulty  Pain immediately resolved suggesting accurate placement of the medication.   Advised to call if fevers/chills, erythema, induration, drainage, or persistent bleeding.  Images permanently stored  Impression: Technically successful ultrasound guided injection.   Impression and Recommendations:    The above documentation has been reviewed and is accurate and complete Tikesha Mort M Alekhya Gravlin, DO

## 2024-05-22 ENCOUNTER — Encounter: Payer: Self-pay | Admitting: Family Medicine

## 2024-05-22 ENCOUNTER — Telehealth: Payer: Self-pay | Admitting: Family Medicine

## 2024-05-22 ENCOUNTER — Other Ambulatory Visit: Payer: Self-pay

## 2024-05-22 ENCOUNTER — Ambulatory Visit: Admitting: Family Medicine

## 2024-05-22 VITALS — BP 128/90 | HR 74 | Ht 66.0 in | Wt 111.0 lb

## 2024-05-22 DIAGNOSIS — G8929 Other chronic pain: Secondary | ICD-10-CM | POA: Diagnosis not present

## 2024-05-22 DIAGNOSIS — M25561 Pain in right knee: Secondary | ICD-10-CM

## 2024-05-22 DIAGNOSIS — M810 Age-related osteoporosis without current pathological fracture: Secondary | ICD-10-CM | POA: Diagnosis not present

## 2024-05-22 DIAGNOSIS — M25562 Pain in left knee: Secondary | ICD-10-CM

## 2024-05-22 DIAGNOSIS — M1711 Unilateral primary osteoarthritis, right knee: Secondary | ICD-10-CM

## 2024-05-22 DIAGNOSIS — M2550 Pain in unspecified joint: Secondary | ICD-10-CM | POA: Diagnosis not present

## 2024-05-22 DIAGNOSIS — M170 Bilateral primary osteoarthritis of knee: Secondary | ICD-10-CM | POA: Diagnosis not present

## 2024-05-22 DIAGNOSIS — M1712 Unilateral primary osteoarthritis, left knee: Secondary | ICD-10-CM

## 2024-05-22 NOTE — Patient Instructions (Addendum)
 Drained cyst today Whey protein isolate Referral Dr. Vernetta Richard to see you! See you again in 2-3 months

## 2024-05-22 NOTE — Assessment & Plan Note (Signed)
 Repeat aspiration of the Baker's cyst.  Severe knee arthritis with some instability.  Referral to orthopedic surgery to discuss the possibility of surgical intervention.  Patient wants to wait until at least this fall if not winter but is considering it.

## 2024-05-22 NOTE — Telephone Encounter (Signed)
 Patient was having some pain after injection and draining of cyst in R knee today. She said pain was in calf and hamstrings on R side and worsened with dorsiflexion. She had already taken aspirin, ibuprofen, and an aleve. Recommended compression for thigh and to ice to reduce swelling. Patient was advised to seek medical attention if pain worsened, there was discoloration, or excessive swelling or tenderness. She did state that the pain was more like muscle soreness. She was also advised to contact us  tomorrow to provide an update.

## 2024-05-22 NOTE — Assessment & Plan Note (Signed)
 Will continue her treatment

## 2024-05-22 NOTE — Assessment & Plan Note (Signed)
 Aspiration of Baker's cyst today.  Tolerated the procedure well, discussed icing regimen of home exercises well, wear a brace for stability when possible.  Follow-up again in 3 months.

## 2024-06-03 NOTE — Telephone Encounter (Signed)
 Patient called back to follow up with Morgen.  She said that it was very painful and she could hardly walk the next day with a lot of bruising.  Currently, the pain has mostly gone away and the bruising has started to get better.  She has been trying to walk without the cane but he leg will start to tighten up as she walks.

## 2024-06-05 NOTE — Telephone Encounter (Signed)
 Abigail [name] can we call one more time on Friday please and make sure continue improvement

## 2024-06-06 NOTE — Telephone Encounter (Signed)
 Spoke with patient this afternoon. Bruising continued to get worse and still having some pain, but doing better overall. Not using the cane all the time. She believes she is moving in the right direction.

## 2024-06-26 ENCOUNTER — Ambulatory Visit (INDEPENDENT_AMBULATORY_CARE_PROVIDER_SITE_OTHER): Admitting: Orthopaedic Surgery

## 2024-06-26 ENCOUNTER — Other Ambulatory Visit (INDEPENDENT_AMBULATORY_CARE_PROVIDER_SITE_OTHER)

## 2024-06-26 DIAGNOSIS — M1711 Unilateral primary osteoarthritis, right knee: Secondary | ICD-10-CM | POA: Diagnosis not present

## 2024-06-26 DIAGNOSIS — M1712 Unilateral primary osteoarthritis, left knee: Secondary | ICD-10-CM

## 2024-06-26 DIAGNOSIS — G8929 Other chronic pain: Secondary | ICD-10-CM

## 2024-06-26 DIAGNOSIS — M25562 Pain in left knee: Secondary | ICD-10-CM | POA: Diagnosis not present

## 2024-06-26 DIAGNOSIS — M25561 Pain in right knee: Secondary | ICD-10-CM | POA: Diagnosis not present

## 2024-06-26 NOTE — Progress Notes (Signed)
 The patient is an 83 year old female who has been seen in this office before.  This is a first time seeing her.  She is a regular patient of Dr. Arthea Sharps who has been treating her bilateral knee arthritis.  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  She says her husband has had hip replacement surgery and knee replacement surgery.  She has had several injections over a period of time and says that it helped for short amount of time.  She does go to a gym and workout regularly and we talked about the exercises to avoid and not avoid as it relates to her knees.  Recently she had to walk with a cane more after aspiration of her right Baker's cyst caused a significant amount of bruising and pain.  She is now off of her cane.  She does have osteoporosis as well.  Examination of both knees today shows varus malalignment with obviously bone-on-bone wear of the medial aspect more so on the right than the left.  Both knees have significant patellofemoral crepitation and pain throughout the arc of motion of the knees.  There is a large palpable Baker's cyst that is firm in the back of her left knee.  X-rays were reviewed with her of both her knees and they both showed severe tricompartment arthritis with varus malalignment and near bone-on-bone wear of the medial compartment and patellofemoral joint.  We had a long and thorough discussion about knee replacement surgery.  It does not sound like she is ready just yet for this type of surgery so she is going to still work on activity modification and strengthening.  If it does get the point she wishes to proceed with surgery she knows to reach out to us  at any time and give us  a call and happy to talk to her on the phone at any time or go over anything with her in detail.  All question concerns were addressed and answered.

## 2024-07-12 ENCOUNTER — Other Ambulatory Visit (HOSPITAL_BASED_OUTPATIENT_CLINIC_OR_DEPARTMENT_OTHER): Payer: Self-pay

## 2024-07-12 MED ORDER — COMIRNATY 30 MCG/0.3ML IM SUSY
0.3000 mL | PREFILLED_SYRINGE | Freq: Once | INTRAMUSCULAR | 0 refills | Status: AC
  Filled 2024-07-12: qty 0.3, 1d supply, fill #0

## 2024-07-12 MED ORDER — FLUZONE HIGH-DOSE 0.5 ML IM SUSY
0.5000 mL | PREFILLED_SYRINGE | Freq: Once | INTRAMUSCULAR | 0 refills | Status: AC
Start: 2024-07-12 — End: 2024-07-13
  Filled 2024-07-12: qty 0.5, 1d supply, fill #0

## 2024-07-16 NOTE — Progress Notes (Unsigned)
 [name] [address] [place] [address] Phone: [phone] Subjective:   Abigail [name], am serving as a scribe for Dr. Arthea Claudene.  I'm seeing this patient by the request  of:  Pcp, No  CC: Bilateral knee pain  YEP:Dlagzrupcz  05/22/2024 Aspiration of Baker's cyst today.  Tolerated the procedure well, discussed icing regimen of home exercises well, wear a brace for stability when possible.  Follow-up again in 3 months.     Repeat aspiration of the Baker's cyst. Severe knee arthritis with some instability. Referral to orthopedic surgery to discuss the possibility of surgical intervention. Patient wants to wait until at least this fall if not winter but is considering it.   Update 07/17/2024 Abigail [name] is a 83 y.o. female coming in with complaint of B knee pain.  After last aspiration had significant bruising and bleeding on one of the legs.  Had seen orthopedic surgery and discussed that they should consider surgery but it seems that she did not schedule.  Patient states that ecchymosis from last aspiration of L knee is finally going way.   No pain in L knee but feels like she needs to get it drained.        Past Medical History:  Diagnosis Date   Arthritis    oa   Baker's cyst    behind left knee   Constipation    Hemorrhoids    Hypertension    MVP (mitral valve prolapse)    Neoplasm    of appendix   Rosacea    Past Surgical History:  Procedure Laterality Date   AUGMENTATION MAMMAPLASTY Bilateral 1990   saline   CARDIAC CATHETERIZATION  17years ago   small amount of blockage   CATRACTS Bilateral    COLONOSCOPY  2006 and oct 2018   ILEOCECETOMY N/A 08/17/2017   Procedure: OPEN ILEOCECETOMY;  Surgeon: Eletha Boas, MD;  Location: [facility];  Service: General;  Laterality: N/A;  [facility]AS PATHWAY   Social History   Socioeconomic History   Marital status: Married    Spouse name: Not on file   Number of children: Not on file    Years of education: Not on file   Highest education level: Not on file  Occupational History   Not on file  Tobacco Use   Smoking status: Former    Current packs/day: 0.50    Average packs/day: 0.5 packs/day for 10.0 years (5.0 ttl pk-yrs)    Types: Cigarettes   Smokeless tobacco: Never   Tobacco comments:    quit 35 yrs ago  Vaping Use   Vaping status: Never Used  Substance and Sexual Activity   Alcohol use: Yes    Alcohol/week: 7.0 standard drinks of alcohol    Types: 7 Glasses of wine per week    Comment: 1 glass red wine  per night   Drug use: No   Sexual activity: Not on file  Other Topics Concern   Not on file  Social History Narrative   Not on file   Social Drivers of Health   Financial Resource Strain: Not on file  Food Insecurity: Not on file  Transportation Needs: Not on file  Physical Activity: Not on file  Stress: Not on file  Social Connections: Not on file   Allergies  Allergen Reactions   Statins Diarrhea and Nausea And Vomiting   Family History  Problem Relation Age of Onset   Colon cancer Cousin  60's maybe     Current Outpatient Medications (Cardiovascular):    losartan  (COZAAR ) 100 MG tablet, Take 100 mg by mouth daily.   Current Outpatient Medications (Analgesics):    Acetaminophen -Aspirin Buffered 250-250 MG tablet, Take 0.05 tablets by mouth as needed.   aspirin EC 81 MG tablet, Take 81 mg by mouth daily.  Current Outpatient Medications (Hematological):    Cyanocobalamin (VITAMIN B-12 PO), Take 1 capsule by mouth daily at 6 (six) AM.  Current Outpatient Medications (Other):    Ascorbic Acid (VITAMIN C PO), Take 1 tablet by mouth daily at 6 (six) AM.   COVID-19 mRNA bivalent vaccine, Pfizer, (PFIZER COVID-19 VAC BIVALENT) injection, Inject into the muscle.   COVID-19 mRNA Vac-TriS, Pfizer, (PFIZER-BIONT COVID-19 VAC-TRIS) SUSP injection, Inject into the muscle.   COVID-19 mRNA vaccine 2023-2024 (COMIRNATY ) syringe, Inject into  the muscle.   famotidine-calcium carbonate-magnesium hydroxide (PEPCID COMPLETE) 10-800-165 MG chewable tablet, Chew 1 tablet by mouth daily as needed.   influenza vaccine adjuvanted (FLUAD  QUADRIVALENT) 0.5 ML injection, Inject into the muscle.   influenza vaccine adjuvanted (FLUAD ) 0.5 ML injection, Inject into the muscle.   LORazepam  (ATIVAN ) 0.5 MG tablet, Take 0.5 mg by mouth 2 (two) times daily as needed for anxiety.   MAGNESIUM GLYCINATE PO, Take 200 mg of amoxicillin by mouth at bedtime.   RSV vaccine recomb adjuvanted (AREXVY ) 120 MCG/0.5ML injection, Inject into the muscle.   Vitamin D , Ergocalciferol , (DRISDOL ) 1.25 MG (50000 UNIT) CAPS capsule, Take 1 capsule (50,000 Units total) by mouth every 7 (seven) days.   Reviewed prior external information including notes and imaging from  primary care provider As well as notes that were available from care everywhere and other healthcare systems.  Past medical history, social, surgical and family history all reviewed in electronic medical record.  No pertanent information unless stated regarding to the chief complaint.   Review of Systems:  No headache, visual changes, nausea, vomiting, diarrhea, constipation, dizziness, abdominal pain, skin rash, fevers, chills, night sweats, weight loss, swollen lymph nodes, body aches, joint swelling, chest pain, shortness of breath, mood changes. POSITIVE muscle aches  Objective  There were no vitals taken for this visit.   General: No apparent distress alert and oriented x3 mood and affect normal, dressed appropriately.  HEENT: Pupils equal, extraocular movements intact  Respiratory: Patient's speak in full sentences and does not appear short of breath  Cardiovascular: No lower extremity edema, non tender, no erythema  Knee exam show patient does have severe arthritic changes bilaterally.  Patient has a significant Baker's cyst noted on the left side.  Procedure: Real-time Ultrasound Guided  Injection of left knee Device: GE Logiq Q7 Ultrasound guided injection is preferred based studies that show increased duration, increased effect, greater accuracy, decreased procedural pain, increased response rate, and decreased cost with ultrasound guided versus blind injection.  Verbal informed consent obtained.  Time-out conducted.  Noted no overlying erythema, induration, or other signs of local infection.  Skin prepped in a sterile fashion.  Local anesthesia: Topical Ethyl chloride.  With sterile technique and under real time ultrasound guidance: With a 22-gauge 2 inch needle patient was injected with 4 cc of 0.5% Marcaine  and aspirated 55 cc of straw-colored fluid then injected 1 cc of Kenalog  40 mg/dL. This was from a posterior approach.  Completed without difficulty  Pain immediately resolved suggesting accurate placement of the medication.  Advised to call if fevers/chills, erythema, induration, drainage, or persistent bleeding.  Images permanently stored  Impression: Technically successful  ultrasound guided injection.    Impression and Recommendations:     The above documentation has been reviewed and is accurate and complete Ko Bardon M Alysiana Ethridge, DO

## 2024-07-17 ENCOUNTER — Other Ambulatory Visit: Payer: Self-pay

## 2024-07-17 ENCOUNTER — Encounter: Payer: Self-pay | Admitting: Family Medicine

## 2024-07-17 ENCOUNTER — Ambulatory Visit: Admitting: Family Medicine

## 2024-07-17 VITALS — BP 104/76 | HR 78 | Ht 66.0 in | Wt 104.0 lb

## 2024-07-17 DIAGNOSIS — M1712 Unilateral primary osteoarthritis, left knee: Secondary | ICD-10-CM | POA: Diagnosis not present

## 2024-07-17 DIAGNOSIS — M25562 Pain in left knee: Secondary | ICD-10-CM

## 2024-07-17 DIAGNOSIS — M25561 Pain in right knee: Secondary | ICD-10-CM | POA: Diagnosis not present

## 2024-07-17 DIAGNOSIS — M7122 Synovial cyst of popliteal space [Baker], left knee: Secondary | ICD-10-CM | POA: Diagnosis not present

## 2024-07-17 NOTE — Assessment & Plan Note (Signed)
 Repeat aspiration of the left knee noted today.  Discussed icing regimen and home exercises, discussed which activities to do and which ones to avoid.  Increase activity slowly.  Discussed icing regimen.  Patient is still not ready for the surgical intervention.  Follow-up again in 2 to 3 months to further evaluate.

## 2024-07-17 NOTE — Patient Instructions (Signed)
 L knee aspiration today See me again in 2 months

## 2024-07-26 DIAGNOSIS — E785 Hyperlipidemia, unspecified: Secondary | ICD-10-CM | POA: Diagnosis not present

## 2024-07-26 DIAGNOSIS — E038 Other specified hypothyroidism: Secondary | ICD-10-CM | POA: Diagnosis not present

## 2024-07-26 DIAGNOSIS — E559 Vitamin D deficiency, unspecified: Secondary | ICD-10-CM | POA: Diagnosis not present

## 2024-07-26 DIAGNOSIS — E7800 Pure hypercholesterolemia, unspecified: Secondary | ICD-10-CM | POA: Diagnosis not present

## 2024-07-26 DIAGNOSIS — Z0000 Encounter for general adult medical examination without abnormal findings: Secondary | ICD-10-CM | POA: Diagnosis not present

## 2024-07-26 DIAGNOSIS — E039 Hypothyroidism, unspecified: Secondary | ICD-10-CM | POA: Diagnosis not present

## 2024-07-26 LAB — LAB REPORT - SCANNED
EGFR: 63
TSH: 3.99

## 2024-08-02 ENCOUNTER — Encounter: Payer: Self-pay | Admitting: Registered Nurse

## 2024-08-02 DIAGNOSIS — E038 Other specified hypothyroidism: Secondary | ICD-10-CM | POA: Diagnosis not present

## 2024-08-02 DIAGNOSIS — R636 Underweight: Secondary | ICD-10-CM | POA: Diagnosis not present

## 2024-08-02 DIAGNOSIS — Z0000 Encounter for general adult medical examination without abnormal findings: Secondary | ICD-10-CM | POA: Diagnosis not present

## 2024-08-02 DIAGNOSIS — I2510 Atherosclerotic heart disease of native coronary artery without angina pectoris: Secondary | ICD-10-CM | POA: Diagnosis not present

## 2024-08-02 DIAGNOSIS — Z681 Body mass index (BMI) 19 or less, adult: Secondary | ICD-10-CM | POA: Diagnosis not present

## 2024-08-02 DIAGNOSIS — I10 Essential (primary) hypertension: Secondary | ICD-10-CM | POA: Diagnosis not present

## 2024-08-02 DIAGNOSIS — M810 Age-related osteoporosis without current pathological fracture: Secondary | ICD-10-CM | POA: Diagnosis not present

## 2024-08-02 DIAGNOSIS — E559 Vitamin D deficiency, unspecified: Secondary | ICD-10-CM | POA: Diagnosis not present

## 2024-08-07 DIAGNOSIS — M25561 Pain in right knee: Secondary | ICD-10-CM | POA: Diagnosis not present

## 2024-08-07 DIAGNOSIS — M25562 Pain in left knee: Secondary | ICD-10-CM | POA: Diagnosis not present

## 2024-08-26 ENCOUNTER — Encounter: Payer: Self-pay | Admitting: Radiology

## 2024-09-12 NOTE — Progress Notes (Signed)
 [name] [address] [place] [address] Phone: [phone] Subjective:   Abigail [name], am serving as a scribe for Dr. Arthea Claudene.  I'm seeing this patient by the request  of:  Pcp, No  CC: Left knee pain  YEP:Dlagzrupcz  07/17/2024 Repeat aspiration of the left knee noted today.  Discussed icing regimen and home exercises, discussed which activities to do and which ones to avoid.  Increase activity slowly.  Discussed icing regimen.  Patient is still not ready for the surgical intervention.  Follow-up again in 2 to 3 months to further evaluate.     Updated 09/17/2024 Abigail [name] is a 83 y.o. female coming in with complaint of B knee pain. Patient said she is still off balance. L knee is painful over the patellar tendon. L knee is tight.        Past Medical History:  Diagnosis Date   Arthritis    oa   Baker's cyst    behind left knee   Constipation    Hemorrhoids    Hypertension    MVP (mitral valve prolapse)    Neoplasm    of appendix   Rosacea    Past Surgical History:  Procedure Laterality Date   AUGMENTATION MAMMAPLASTY Bilateral 1990   saline   CARDIAC CATHETERIZATION  17years ago   small amount of blockage   CATRACTS Bilateral    COLONOSCOPY  2006 and oct 2018   ILEOCECETOMY N/A 08/17/2017   Procedure: OPEN ILEOCECETOMY;  Surgeon: Eletha Boas, MD;  Location: [facility];  Service: General;  Laterality: N/A;  [facility] PATHWAY   Social History   Socioeconomic History   Marital status: Married    Spouse name: Not on file   Number of children: Not on file   Years of education: Not on file   Highest education level: Not on file  Occupational History   Not on file  Tobacco Use   Smoking status: Former    Current packs/day: 0.50    Average packs/day: 0.5 packs/day for 10.0 years (5.0 ttl pk-yrs)    Types: Cigarettes   Smokeless tobacco: Never   Tobacco comments:    quit 35 yrs ago  Vaping Use   Vaping status:  Never Used  Substance and Sexual Activity   Alcohol use: Yes    Alcohol/week: 7.0 standard drinks of alcohol    Types: 7 Glasses of wine per week    Comment: 1 glass red wine  per night   Drug use: No   Sexual activity: Not on file  Other Topics Concern   Not on file  Social History Narrative   Not on file   Social Drivers of Health   Financial Resource Strain: Not on file  Food Insecurity: Not on file  Transportation Needs: Not on file  Physical Activity: Not on file  Stress: Not on file  Social Connections: Not on file   Allergies  Allergen Reactions   Statins Diarrhea and Nausea And Vomiting   Family History  Problem Relation Age of Onset   Colon cancer Cousin        63's maybe     Current Outpatient Medications (Cardiovascular):    losartan  (COZAAR ) 100 MG tablet, Take 100 mg by mouth daily.   Current Outpatient Medications (Analgesics):    Acetaminophen -Aspirin Buffered 250-250 MG tablet, Take 0.05 tablets by mouth as needed.   aspirin EC 81 MG tablet, Take 81 mg by mouth daily.  Current  Outpatient Medications (Hematological):    Cyanocobalamin (VITAMIN B-12 PO), Take 1 capsule by mouth daily at 6 (six) AM.  Current Outpatient Medications (Other):    Ascorbic Acid (VITAMIN C PO), Take 1 tablet by mouth daily at 6 (six) AM.   COVID-19 mRNA bivalent vaccine, Pfizer, (PFIZER COVID-19 VAC BIVALENT) injection, Inject into the muscle.   COVID-19 mRNA Vac-TriS, Pfizer, (PFIZER-BIONT COVID-19 VAC-TRIS) SUSP injection, Inject into the muscle.   COVID-19 mRNA vaccine 2023-2024 (COMIRNATY ) syringe, Inject into the muscle.   famotidine-calcium carbonate-magnesium hydroxide (PEPCID COMPLETE) 10-800-165 MG chewable tablet, Chew 1 tablet by mouth daily as needed.   influenza vaccine adjuvanted (FLUAD  QUADRIVALENT) 0.5 ML injection, Inject into the muscle.   influenza vaccine adjuvanted (FLUAD ) 0.5 ML injection, Inject into the muscle.   LORazepam  (ATIVAN ) 0.5 MG tablet, Take  0.5 mg by mouth 2 (two) times daily as needed for anxiety.   MAGNESIUM GLYCINATE PO, Take 200 mg of amoxicillin by mouth at bedtime.   RSV vaccine recomb adjuvanted (AREXVY ) 120 MCG/0.5ML injection, Inject into the muscle.   Vitamin D , Ergocalciferol , (DRISDOL ) 1.25 MG (50000 UNIT) CAPS capsule, Take 1 capsule (50,000 Units total) by mouth every 7 (seven) days.   Reviewed prior external information including notes and imaging from  primary care provider As well as notes that were available from care everywhere and other healthcare systems.  Past medical history, social, surgical and family history all reviewed in electronic medical record.  No pertanent information unless stated regarding to the chief complaint.   Review of Systems:  No headache, visual changes, nausea, vomiting, diarrhea, constipation, dizziness, abdominal pain, skin rash, fevers, chills, night sweats, weight loss, swollen lymph nodes, body aches, joint swelling, chest pain, shortness of breath, mood changes. POSITIVE muscle aches  Objective  Blood pressure 120/76, pulse 70, height 5' 6 (1.676 m), weight 105 lb (47.6 kg), SpO2 100%.   General: No apparent distress alert and oriented x3 mood and affect normal, dressed appropriately.  HEENT: Pupils equal, extraocular movements intact  Respiratory: Patient's speak in full sentences and does not appear short of breath  Cardiovascular: No lower extremity edema, non tender, no erythema  Antalgic gait noted.  Poor core strength noted.  Patient does have severe arthritic changes of the knees bilaterally.  Procedure: Real-time Ultrasound Guided Injection of left knee and aspiration of Baker's cyst Device: GE Logiq Q7 Ultrasound guided injection is preferred based studies that show increased duration, increased effect, greater accuracy, decreased procedural pain, increased response rate, and decreased cost with ultrasound guided versus blind injection.  Verbal informed consent  obtained.  Time-out conducted.  Noted no overlying erythema, induration, or other signs of local infection.  Skin prepped in a sterile fashion.  Local anesthesia: Topical Ethyl chloride.  With sterile technique and under real time ultrasound guidance: With a 22-gauge 2 inch needle patient was injected with 4 cc of 0.5% Marcaine  and aspirated 50 cc of straw-colored fluid then injected 1 cc of Kenalog  40 mg/dL. This was from a posterior approach.  Completed without difficulty  Pain immediately resolved suggesting accurate placement of the medication.  Advised to call if fevers/chills, erythema, induration, drainage, or persistent bleeding.  Images permanently stored   Impression: Technically successful ultrasound guided injection.    Impression and Recommendations:

## 2024-09-17 ENCOUNTER — Other Ambulatory Visit: Payer: Self-pay

## 2024-09-17 ENCOUNTER — Ambulatory Visit (INDEPENDENT_AMBULATORY_CARE_PROVIDER_SITE_OTHER): Admitting: Family Medicine

## 2024-09-17 ENCOUNTER — Encounter: Payer: Self-pay | Admitting: Family Medicine

## 2024-09-17 VITALS — BP 120/76 | HR 70 | Ht 66.0 in | Wt 105.0 lb

## 2024-09-17 DIAGNOSIS — M25562 Pain in left knee: Secondary | ICD-10-CM | POA: Diagnosis not present

## 2024-09-17 DIAGNOSIS — G8929 Other chronic pain: Secondary | ICD-10-CM | POA: Diagnosis not present

## 2024-09-17 DIAGNOSIS — M25561 Pain in right knee: Secondary | ICD-10-CM

## 2024-09-17 DIAGNOSIS — M7122 Synovial cyst of popliteal space [Baker], left knee: Secondary | ICD-10-CM | POA: Diagnosis not present

## 2024-09-17 DIAGNOSIS — R2689 Other abnormalities of gait and mobility: Secondary | ICD-10-CM | POA: Insufficient documentation

## 2024-09-17 NOTE — Patient Instructions (Addendum)
 Thigh compression sleeve Tommy Copper or Body Helix Aspirated L knee PT [facility] See me again in 10 weeks

## 2024-09-17 NOTE — Assessment & Plan Note (Addendum)
 Aspiration occurred again today.  Tolerated the procedure well, discussed icing regimen and home exercise.  Discussed which activities to do andto avoid.  Increase activity slowly.  Follow-up again in 6 to 12 weeks patient is considering surgical intervention on the contralateral knee maybe early spring

## 2024-09-17 NOTE — Assessment & Plan Note (Signed)
 Refer to physical therapy for balance and coordination.  Patient is a fall risk.

## 2024-09-27 ENCOUNTER — Ambulatory Visit: Admitting: Cardiovascular Disease

## 2024-10-23 ENCOUNTER — Other Ambulatory Visit: Payer: Self-pay | Admitting: Internal Medicine

## 2024-10-23 DIAGNOSIS — Z1231 Encounter for screening mammogram for malignant neoplasm of breast: Secondary | ICD-10-CM

## 2024-11-12 ENCOUNTER — Encounter: Payer: Self-pay | Admitting: Physical Therapy

## 2024-11-12 ENCOUNTER — Ambulatory Visit: Admitting: Physical Therapy

## 2024-11-12 DIAGNOSIS — G8929 Other chronic pain: Secondary | ICD-10-CM

## 2024-11-12 DIAGNOSIS — M25561 Pain in right knee: Secondary | ICD-10-CM

## 2024-11-12 DIAGNOSIS — M170 Bilateral primary osteoarthritis of knee: Secondary | ICD-10-CM | POA: Diagnosis not present

## 2024-11-12 DIAGNOSIS — M25562 Pain in left knee: Secondary | ICD-10-CM | POA: Diagnosis not present

## 2024-11-12 NOTE — Therapy (Signed)
 " OUTPATIENT PHYSICAL THERAPY LOWER EXTREMITY EVALUATION   Patient Name: Abigail [name] MRN: [id number] DOB:27-Dec-1940, 84 y.o., female Today's Date: 11/12/2024  END OF SESSION:  PT End of Session - 11/12/24 1435     Visit Number 1    Number of Visits 8    Date for Recertification  01/07/25    Authorization Type [employer] MCR    PT Start Time 1350    PT Stop Time 1440    PT Time Calculation (min) 50 min          Past Medical History:  Diagnosis Date   Arthritis    oa   Baker's cyst    behind left knee   Constipation    Hemorrhoids    Hypertension    MVP (mitral valve prolapse)    Neoplasm    of appendix   Rosacea    Past Surgical History:  Procedure Laterality Date   AUGMENTATION MAMMAPLASTY Bilateral 1990   saline   CARDIAC CATHETERIZATION  17years ago   small amount of blockage   CATRACTS Bilateral    COLONOSCOPY  2006 and oct 2018   ILEOCECETOMY N/A 08/17/2017   Procedure: OPEN ILEOCECETOMY;  Surgeon: Eletha Boas, MD;  Location: [facility];  Service: General;  Laterality: N/A;  [facility] PATHWAY   Patient Active Problem List   Diagnosis Date Noted   Balance disorder 09/17/2024   Degenerative disc disease, lumbar 02/15/2023   Age-related osteoporosis without current pathological fracture 09/22/2021   Greater trochanteric bursitis of left hip 11/04/2020   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   Gastroesophageal reflux disease 06/29/2017   Hypercholesterolemia 06/29/2017   Hypertensive disorder 06/29/2017   Mitral valve disorder 06/29/2017   Tear of LCL (lateral collateral ligament) of knee, left, subsequent encounter 08/09/2016   Unilateral primary osteoarthritis, left knee 07/06/2016   Unilateral primary osteoarthritis, right knee 02/25/2016   Piriformis syndrome of right side 06/24/2015   Primary localized osteoarthrosis, lower leg 06/13/2014   Baker's cyst of knee 05/20/2014    PCP: -  REFERRING PROVIDER: Dr. Arthea Sharps   REFERRING DIAG:  Sharps Arthea HERO, DO  M25.561,M25.562,G89.29 (ICD-10-CM) - Chronic pain of both knees  THERAPY DIAG:  Primary osteoarthritis of both knees  Chronic pain of both knees  Rationale for Evaluation and Treatment: Rehabilitation  ONSET DATE: July 2025  SUBJECTIVE:   SUBJECTIVE STATEMENT: Patient has had physical therapy in the past by another therapist named Delon, she was a bit confused thinking that was going to be me so we did spend a little bit of time getting things straightened out with that. She states that she did have PT last year and was able to eventually go back to the gym.  The referral today is for bilateral knee pain.  She has a known Baker's cyst in the left knee and in July she had it drained.  She has her left knee drained periodically by Dr. Sharps.  She also had the right knee drained but it felt different this time and had a lot of bruising and pain afterward.  This is resolved since then.  She has also had a right sided inner thigh groin strain a couple of times which lasted a few days but is not bothering her right now Right now she knows she does need to continue exercising but she was afraid to do anything for fear of flaring up her pain  PERTINENT HISTORY: Chronic knee pain, HTN, MVP  PAIN:  Are you having  pain? Yes: NPRS scale: min  Pain location: L knee anterior  Pain description: achy, sore  Aggravating factors: stairs, walking.  Relieving factors: rest   L hip adductor pain currently  PRECAUTIONS: Other: osteoporosis   RED FLAGS: None   WEIGHT BEARING RESTRICTIONS: No  FALLS:  Has patient fallen in last 6 months? No  LIVING ENVIRONMENT: Lives with: lives with their family Lives in: House/apartment Stairs: Yes: Internal: 12 steps; on right going up Has following equipment at home: None cane but does not use   OCCUPATION: not working   PLOF: Independent, Leisure: dogs, and travel , nurse, adult at [employer]   PATIENT GOALS: I would like to be  get balanced and stronger   NEXT MD VISIT: Feb 3   OBJECTIVE:  Note: Objective measures were completed at Evaluation unless otherwise noted.  DIAGNOSTIC FINDINGS: 2 views of the left knee show a tricompartmental arthritis with varus malalignment and significant medial and patellofemoral narrowing. The bone is osteopenic.   2 views of the left knee show a tricompartmental arthritis with varus malalignment and significant medial and patellofemoral narrowing. The bone is osteopenic.   PATIENT SURVEYS:  KOOS 63%, better is lower (Goal is 50% )   COGNITION: Overall cognitive status: Within functional limits for tasks assessed     SENSATION: WFL  EDEMA:  None MUSCLE LENGTH: Hamstrings:WNL Thomas test: WNL   POSTURE: rounded shoulders, forward head, decreased lumbar lordosis, right pelvic obliquity, and high Rt pelvis , scoliosis   PALPATION:  L knee Bakers  Cyst   LOWER EXTREMITY ROM:  Active ROM Right eval Left eval  Hip flexion    Hip extension    Hip abduction    Hip adduction    Hip internal rotation    Hip external rotation    Knee flexion 140 140  Knee extension 0 8  Ankle dorsiflexion    Ankle plantarflexion    Ankle inversion    Ankle eversion     (Blank rows = not tested)  LOWER EXTREMITY MMT:  MMT Right eval Left eval  Hip flexion 4 4  Hip extension    Hip abduction 4- 4-  Hip adduction    Hip internal rotation    Hip external rotation    Knee flexion 5 5  Knee extension 5 5  Ankle dorsiflexion    Ankle plantarflexion    Ankle inversion    Ankle eversion     (Blank rows = not tested)  LOWER EXTREMITY SPECIAL TESTS:  NT   FUNCTIONAL TESTS:  5 times sit to stand: 19 sec    GAIT: Distance walked: 100 Assistive device utilized: None Level of assistance: Complete Independence Comments: postural asymmetry    TREATMENT DATE:   11/12/24 PT eval complete Eval limited by time,lengthy patient intake    PATIENT EDUCATION:  Education  details: PT, POC , osteoporosis  Person educated: Patient Education method: Programmer, Multimedia, Demonstration, Verbal cues, and Handouts Education comprehension: needs further education  HOME EXERCISE PROGRAM: Access Code: [email or web address] URL: [email or web address] Date: 11/12/2024 Prepared by: Delon Norma  Exercises - Active Straight Leg Raise with Quad Set  - 1 x daily - 7 x weekly - 2 sets - 10 reps - 5 hold - Sidelying Hip Abduction  - 1 x daily - 7 x weekly - 2 sets - 10 reps - 5 hold - Supine Bridge  - 1 x daily - 7 x weekly - 2 sets - 10 reps - 5 hold - Sit to Stand  -  1 x daily - 7 x weekly - 2 sets - 10 reps - 5 hold  ASSESSMENT:  CLINICAL IMPRESSION: Patient is a 83y.o. female who was seen today for physical therapy evaluation and treatment for bilateral knee pain.   OBJECTIVE IMPAIRMENTS: Abnormal gait, decreased coordination, decreased endurance, decreased mobility, difficulty walking, decreased strength, postural dysfunction, and pain.   ACTIVITY LIMITATIONS: standing, squatting, stairs, transfers, and locomotion level  PARTICIPATION LIMITATIONS: shopping, community activity, yard work, and gardening   PERSONAL FACTORS: Age, Time since onset of injury/illness/exacerbation, and 1-2 comorbidities: Severe knee osteoarthritis scoliosis are also affecting patient's functional outcome.   REHAB POTENTIAL: Excellent  CLINICAL DECISION MAKING: Evolving/moderate complexity  EVALUATION COMPLEXITY: Moderate   GOALS: Goals reviewed with patient? Yes  LONG TERM GOALS: Target date: 12/24/2024    Patient will be independent with final HEP upon discharge from PT and report consistent benefit following exercise completion.    Baseline:  Goal status: INITIAL  2.  Patient will improve 5 x STS to 13 sec or less to demo decreased fall risk.   Baseline: 19 sec  Goal status: INITIAL  3.  Patient will understand posture and body mechanics related to preserving spine integrity  related to osteoporosis Baseline:  Goal status: INITIAL  4.  Patient will be able to return to the gym with confidence and no increase in knee pain Baseline:  Goal status: INITIAL  5.  Patient will increase hip strength to 4+ out of 5 in order to support her knees (abduction and extension) Baseline:  Goal status: INITIAL    PLAN:  PT FREQUENCY: 1-2x/week  PT DURATION: 6 weeks  PLANNED INTERVENTIONS: 97164- PT Re-evaluation, 97750- Physical Performance Testing, 97110-Therapeutic exercises, 97530- Therapeutic activity, 97112- Neuromuscular re-education, 97535- Self Care, 02859- Manual therapy, [phone]- Gait training, [phone]- Aquatic Therapy, Patient/Family education, Balance training, Stair training, Taping, Joint mobilization, Cryotherapy, and Moist heat  PLAN FOR NEXT SESSION: Check home exercise program recumbent bike standing exercises   Filiberto Wamble, PT 11/12/2024, 6:21 PM   Delon Norma, PT 11/12/24 6:32 PM Phone: [phone] Fax: [phone]  "

## 2024-11-19 ENCOUNTER — Ambulatory Visit

## 2024-11-22 NOTE — Progress Notes (Unsigned)
 " [name] [address] [place] [address] Phone: [phone] Subjective:    I'm seeing this patient by the request  of:  Pcp, No  CC:   YEP:Dlagzrupcz  09/17/2024 Refer to physical therapy for balance and coordination.  Patient is a fall risk.     Aspiration occurred again today.  Tolerated the procedure well, discussed icing regimen and home exercise.  Discussed which activities to do andto avoid.  Increase activity slowly.  Follow-up again in 6 to 12 weeks patient is considering surgical intervention on the contralateral knee maybe early spring     Updated 11/26/2024 Abigail [name] is a 84 y.o. female coming in with complaint of knee pain       Past Medical History:  Diagnosis Date   Arthritis    oa   Baker's cyst    behind left knee   Constipation    Hemorrhoids    Hypertension    MVP (mitral valve prolapse)    Neoplasm    of appendix   Rosacea    Past Surgical History:  Procedure Laterality Date   AUGMENTATION MAMMAPLASTY Bilateral 1990   saline   CARDIAC CATHETERIZATION  17years ago   small amount of blockage   CATRACTS Bilateral    COLONOSCOPY  2006 and oct 2018   ILEOCECETOMY N/A 08/17/2017   Procedure: OPEN ILEOCECETOMY;  Surgeon: Eletha Boas, MD;  Location: [facility];  Service: General;  Laterality: N/A;  [facility]AS PATHWAY   Social History   Socioeconomic History   Marital status: Married    Spouse name: Not on file   Number of children: Not on file   Years of education: Not on file   Highest education level: Not on file  Occupational History   Not on file  Tobacco Use   Smoking status: Former    Current packs/day: 0.50    Average packs/day: 0.5 packs/day for 10.0 years (5.0 ttl pk-yrs)    Types: Cigarettes   Smokeless tobacco: Never   Tobacco comments:    quit 35 yrs ago  Vaping Use   Vaping status: Never Used  Substance and Sexual Activity   Alcohol use: Yes    Alcohol/week: 7.0 standard drinks of alcohol     Types: 7 Glasses of wine per week    Comment: 1 glass red wine  per night   Drug use: No   Sexual activity: Not on file  Other Topics Concern   Not on file  Social History Narrative   Not on file   Social Drivers of Health   Tobacco Use: Medium Risk (11/12/2024)   Patient History    Smoking Tobacco Use: Former    Smokeless Tobacco Use: Never    Passive Exposure: Not on Actuary Strain: Not on file  Food Insecurity: Not on file  Transportation Needs: Not on file  Physical Activity: Not on file  Stress: Not on file  Social Connections: Not on file  Depression (EYV7-0): Not on file  Alcohol Screen: Not on file  Housing: Not on file  Utilities: Not on file  Health Literacy: Not on file   Allergies[1] Family History  Problem Relation Age of Onset   Colon cancer Cousin        60's maybe    Current Outpatient Medications (Cardiovascular):    losartan  (COZAAR ) 100 MG tablet, Take 100 mg by mouth daily.  Current Outpatient Medications (Analgesics):    Acetaminophen -Aspirin Buffered 250-250 MG tablet,  Take 0.05 tablets by mouth as needed.   aspirin EC 81 MG tablet, Take 81 mg by mouth daily.  Current Outpatient Medications (Hematological):    Cyanocobalamin (VITAMIN B-12 PO), Take 1 capsule by mouth daily at 6 (six) AM.  Current Outpatient Medications (Other):    Ascorbic Acid (VITAMIN C PO), Take 1 tablet by mouth daily at 6 (six) AM.   COVID-19 mRNA bivalent vaccine, Pfizer, (PFIZER COVID-19 VAC BIVALENT) injection, Inject into the muscle.   COVID-19 mRNA Vac-TriS, Pfizer, (PFIZER-BIONT COVID-19 VAC-TRIS) SUSP injection, Inject into the muscle.   COVID-19 mRNA vaccine 2023-2024 (COMIRNATY ) syringe, Inject into the muscle.   famotidine-calcium carbonate-magnesium hydroxide (PEPCID COMPLETE) 10-800-165 MG chewable tablet, Chew 1 tablet by mouth daily as needed.   influenza vaccine adjuvanted (FLUAD  QUADRIVALENT) 0.5 ML injection, Inject into the muscle.    influenza vaccine adjuvanted (FLUAD ) 0.5 ML injection, Inject into the muscle.   LORazepam  (ATIVAN ) 0.5 MG tablet, Take 0.5 mg by mouth 2 (two) times daily as needed for anxiety.   MAGNESIUM GLYCINATE PO, Take 200 mg of amoxicillin by mouth at bedtime.   RSV vaccine recomb adjuvanted (AREXVY ) 120 MCG/0.5ML injection, Inject into the muscle.   Vitamin D , Ergocalciferol , (DRISDOL ) 1.25 MG (50000 UNIT) CAPS capsule, Take 1 capsule (50,000 Units total) by mouth every 7 (seven) days.   Reviewed prior external information including notes and imaging from  primary care provider As well as notes that were available from care everywhere and other healthcare systems.  Past medical history, social, surgical and family history all reviewed in electronic medical record.  No pertanent information unless stated regarding to the chief complaint.   Review of Systems:  No headache, visual changes, nausea, vomiting, diarrhea, constipation, dizziness, abdominal pain, skin rash, fevers, chills, night sweats, weight loss, swollen lymph nodes, body aches, joint swelling, chest pain, shortness of breath, mood changes. POSITIVE muscle aches  Objective  There were no vitals taken for this visit.   General: No apparent distress alert and oriented x3 mood and affect normal, dressed appropriately.  HEENT: Pupils equal, extraocular movements intact  Respiratory: Patient's speak in full sentences and does not appear short of breath  Cardiovascular: No lower extremity edema, non tender, no erythema      Impression and Recommendations:           [1]  Allergies Allergen Reactions   Statins Diarrhea and Nausea And Vomiting   "

## 2024-11-26 ENCOUNTER — Ambulatory Visit: Admitting: Family Medicine

## 2024-11-28 ENCOUNTER — Ambulatory Visit

## 2024-12-03 ENCOUNTER — Encounter: Admitting: Physical Therapy

## 2024-12-05 ENCOUNTER — Ambulatory Visit: Admitting: Family Medicine

## 2024-12-10 ENCOUNTER — Ambulatory Visit

## 2024-12-12 ENCOUNTER — Encounter: Admitting: Physical Therapy

## 2024-12-19 ENCOUNTER — Encounter: Admitting: Physical Therapy

## 2024-12-24 ENCOUNTER — Encounter: Admitting: Physical Therapy

## 2025-01-06 ENCOUNTER — Ambulatory Visit: Admitting: Cardiovascular Disease
